# Patient Record
Sex: Male | Born: 1940 | Race: White | Hispanic: No | Marital: Married | Smoking: Former smoker
Health system: Southern US, Community
[De-identification: ages and names within clinical notes are randomized; demographics above are authoritative.]

## PROBLEM LIST (undated history)

## (undated) DIAGNOSIS — C320 Malignant neoplasm of glottis: Secondary | ICD-10-CM

## (undated) DIAGNOSIS — I2111 ST elevation (STEMI) myocardial infarction involving right coronary artery: Secondary | ICD-10-CM

## (undated) DIAGNOSIS — M5146 Schmorl's nodes, lumbar region: Secondary | ICD-10-CM

## (undated) DIAGNOSIS — I700 Atherosclerosis of aorta: Secondary | ICD-10-CM

## (undated) DIAGNOSIS — H02403 Unspecified ptosis of bilateral eyelids: Secondary | ICD-10-CM

## (undated) DIAGNOSIS — J449 Chronic obstructive pulmonary disease, unspecified: Secondary | ICD-10-CM

## (undated) DIAGNOSIS — N281 Cyst of kidney, acquired: Secondary | ICD-10-CM

## (undated) DIAGNOSIS — K08109 Complete loss of teeth, unspecified cause, unspecified class: Secondary | ICD-10-CM

## (undated) DIAGNOSIS — R0609 Other forms of dyspnea: Secondary | ICD-10-CM

## (undated) DIAGNOSIS — Z7982 Long term (current) use of aspirin: Secondary | ICD-10-CM

## (undated) DIAGNOSIS — M1990 Unspecified osteoarthritis, unspecified site: Secondary | ICD-10-CM

## (undated) DIAGNOSIS — N400 Enlarged prostate without lower urinary tract symptoms: Secondary | ICD-10-CM

## (undated) DIAGNOSIS — J381 Polyp of vocal cord and larynx: Secondary | ICD-10-CM

## (undated) DIAGNOSIS — I219 Acute myocardial infarction, unspecified: Secondary | ICD-10-CM

## (undated) DIAGNOSIS — R05 Cough: Secondary | ICD-10-CM

## (undated) DIAGNOSIS — Z9841 Cataract extraction status, right eye: Secondary | ICD-10-CM

## (undated) DIAGNOSIS — I2510 Atherosclerotic heart disease of native coronary artery without angina pectoris: Secondary | ICD-10-CM

## (undated) DIAGNOSIS — R0600 Dyspnea, unspecified: Secondary | ICD-10-CM

## (undated) DIAGNOSIS — E039 Hypothyroidism, unspecified: Secondary | ICD-10-CM

## (undated) DIAGNOSIS — R911 Solitary pulmonary nodule: Secondary | ICD-10-CM

## (undated) DIAGNOSIS — Z972 Presence of dental prosthetic device (complete) (partial): Secondary | ICD-10-CM

## (undated) DIAGNOSIS — R059 Cough, unspecified: Secondary | ICD-10-CM

## (undated) DIAGNOSIS — I452 Bifascicular block: Secondary | ICD-10-CM

## (undated) DIAGNOSIS — R053 Chronic cough: Secondary | ICD-10-CM

## (undated) DIAGNOSIS — E7800 Pure hypercholesterolemia, unspecified: Secondary | ICD-10-CM

## (undated) HISTORY — DX: Malignant neoplasm of glottis: C32.0

## (undated) HISTORY — PX: BACK SURGERY: SHX140

## (undated) HISTORY — PX: MICROLARYNGOSCOPY: SHX5208

## (undated) HISTORY — PX: VEIN LIGATION: SHX2652

## (undated) HISTORY — DX: Acute myocardial infarction, unspecified: I21.9

## (undated) HISTORY — PX: CORONARY ANGIOPLASTY: SHX604

## (undated) HISTORY — PX: HERNIA REPAIR: SHX51

---

## 2008-03-21 ENCOUNTER — Emergency Department: Payer: Self-pay | Admitting: Emergency Medicine

## 2010-06-17 ENCOUNTER — Ambulatory Visit: Payer: Self-pay | Admitting: Anesthesiology

## 2010-06-20 ENCOUNTER — Ambulatory Visit: Payer: Self-pay | Admitting: Unknown Physician Specialty

## 2010-07-11 ENCOUNTER — Ambulatory Visit: Payer: Self-pay | Admitting: Internal Medicine

## 2010-07-20 ENCOUNTER — Ambulatory Visit: Payer: Self-pay | Admitting: Internal Medicine

## 2012-04-20 DIAGNOSIS — J690 Pneumonitis due to inhalation of food and vomit: Secondary | ICD-10-CM

## 2012-04-20 HISTORY — DX: Pneumonitis due to inhalation of food and vomit: J69.0

## 2012-06-12 LAB — COMPREHENSIVE METABOLIC PANEL
Albumin: 3.6 g/dL (ref 3.4–5.0)
BUN: 15 mg/dL (ref 7–18)
Chloride: 105 mmol/L (ref 98–107)
Creatinine: 0.93 mg/dL (ref 0.60–1.30)
EGFR (African American): >60
EGFR (Non-African Amer.): >60
Potassium: 4.5 mmol/L (ref 3.5–5.1)
SGPT (ALT): 29 U/L (ref 12–78)
Total Protein: 7.7 g/dL (ref 6.4–8.2)

## 2012-06-12 LAB — CBC
HGB: 14.9 g/dL (ref 13.0–18.0)
MCHC: 34.8 g/dL (ref 32.0–36.0)
RBC: 4.68 10*6/uL (ref 4.40–5.90)
RDW: 13.4 % (ref 11.5–14.5)
WBC: 8.6 10*3/uL (ref 3.8–10.6)

## 2012-06-12 LAB — ETHANOL: Ethanol %: <0.003 % (ref 0.000–0.080)

## 2012-06-13 ENCOUNTER — Observation Stay: Payer: Self-pay | Admitting: Internal Medicine

## 2012-06-13 LAB — DRUG SCREEN, URINE
Amphetamines, Ur Screen: NEGATIVE (ref ?–1000)
Barbiturates, Ur Screen: NEGATIVE (ref ?–200)
Benzodiazepine, Ur Scrn: NEGATIVE (ref ?–200)
Cannabinoid 50 Ng, Ur ~~LOC~~: NEGATIVE (ref ?–50)
Cocaine Metabolite,Ur ~~LOC~~: NEGATIVE (ref ?–300)
Methadone, Ur Screen: NEGATIVE (ref ?–300)
Phencyclidine (PCP) Ur S: NEGATIVE (ref ?–25)
Tricyclic, Ur Screen: NEGATIVE (ref ?–1000)

## 2012-06-13 LAB — HEMOGLOBIN A1C: Hemoglobin A1C: 5.7 % (ref 4.2–6.3)

## 2012-06-13 LAB — URINALYSIS, COMPLETE
Ketone: NEGATIVE
Nitrite: NEGATIVE
RBC,UR: <1 /HPF (ref 0–5)
Specific Gravity: 1.013 (ref 1.003–1.030)

## 2012-06-13 LAB — MAGNESIUM: Magnesium: 2.1 mg/dL

## 2013-09-06 DIAGNOSIS — L570 Actinic keratosis: Secondary | ICD-10-CM | POA: Insufficient documentation

## 2014-03-12 DIAGNOSIS — K4090 Unilateral inguinal hernia, without obstruction or gangrene, not specified as recurrent: Secondary | ICD-10-CM | POA: Insufficient documentation

## 2014-03-12 DIAGNOSIS — E785 Hyperlipidemia, unspecified: Secondary | ICD-10-CM | POA: Insufficient documentation

## 2014-04-17 ENCOUNTER — Ambulatory Visit: Payer: Self-pay | Admitting: Surgery

## 2014-04-17 DIAGNOSIS — Z01810 Encounter for preprocedural cardiovascular examination: Secondary | ICD-10-CM

## 2014-04-17 LAB — COMPREHENSIVE METABOLIC PANEL
ALK PHOS: 92 U/L
AST: 34 U/L (ref 15–37)
Albumin: 3.9 g/dL (ref 3.4–5.0)
Anion Gap: 6 — ABNORMAL LOW (ref 7–16)
BUN: 22 mg/dL — ABNORMAL HIGH (ref 7–18)
Bilirubin,Total: 0.4 mg/dL (ref 0.2–1.0)
CO2: 28 mmol/L (ref 21–32)
Calcium, Total: 9.6 mg/dL (ref 8.5–10.1)
Chloride: 106 mmol/L (ref 98–107)
Creatinine: 0.94 mg/dL (ref 0.60–1.30)
EGFR (African American): >60
EGFR (Non-African Amer.): >60
GLUCOSE: 116 mg/dL — AB (ref 65–99)
Osmolality: 284 (ref 275–301)
Potassium: 4.2 mmol/L (ref 3.5–5.1)
SGPT (ALT): 39 U/L
SODIUM: 140 mmol/L (ref 136–145)
Total Protein: 8 g/dL (ref 6.4–8.2)

## 2014-07-18 DIAGNOSIS — K4090 Unilateral inguinal hernia, without obstruction or gangrene, not specified as recurrent: Secondary | ICD-10-CM | POA: Diagnosis not present

## 2014-07-18 DIAGNOSIS — Z01818 Encounter for other preprocedural examination: Secondary | ICD-10-CM | POA: Diagnosis not present

## 2014-07-31 ENCOUNTER — Ambulatory Visit
Admit: 2014-07-31 | Disposition: A | Discharge status: Home / Self Care | Payer: Self-pay | Attending: Surgery | Admitting: Surgery

## 2014-07-31 DIAGNOSIS — H9193 Unspecified hearing loss, bilateral: Secondary | ICD-10-CM | POA: Diagnosis not present

## 2014-07-31 DIAGNOSIS — K4090 Unilateral inguinal hernia, without obstruction or gangrene, not specified as recurrent: Secondary | ICD-10-CM | POA: Diagnosis not present

## 2014-07-31 DIAGNOSIS — Z809 Family history of malignant neoplasm, unspecified: Secondary | ICD-10-CM | POA: Diagnosis not present

## 2014-07-31 DIAGNOSIS — M4692 Unspecified inflammatory spondylopathy, cervical region: Secondary | ICD-10-CM | POA: Diagnosis not present

## 2014-07-31 DIAGNOSIS — Z9889 Other specified postprocedural states: Secondary | ICD-10-CM | POA: Diagnosis not present

## 2014-07-31 DIAGNOSIS — Z87891 Personal history of nicotine dependence: Secondary | ICD-10-CM | POA: Diagnosis not present

## 2014-07-31 DIAGNOSIS — D176 Benign lipomatous neoplasm of spermatic cord: Secondary | ICD-10-CM | POA: Diagnosis not present

## 2014-07-31 DIAGNOSIS — Z79899 Other long term (current) drug therapy: Secondary | ICD-10-CM | POA: Diagnosis not present

## 2014-08-10 NOTE — H&P (Signed)
PATIENT NAME:  Kevin Glover, FOISY MR#:  [id number] DATE OF BIRTH:  10/06/1940  DATE OF ADMISSION:  06/13/2012  PRIMARY CARE PHYSICIAN: Dr. Lorelee Market.   REFERRING PHYSICIAN: Dr. Randie Heinz.   CHIEF COMPLAINT: Epilepsy.   HISTORY OF PRESENT ILLNESS: The patient is a 74 year old Caucasian male with a healthy past medical history, who was in his usual state of health. He was at home and at around 9:00 p.m. he went to bed and within 10 minutes hiss wife heard him kind of shouting and when she arrived he was in bed unresponsive. There was a frothy sputum coming out of his mouth. He was jerking all over on both sides of his body with both arms flexed and he was unresponsive to her verbal commands. The wife called her son, who works with EMS and at the time he arrived the patient was not seizing anymore, but he was still in postictal phase and responsive. EMS was called and the patient was transported to the hospital. The patient woke up during the transportation and all what he remembers that he is in the ambulance. He does not remember anything prior to that except that he went to bed. There is no prior history of seizure activity or syncope. Evaluation here at the Emergency Department including CAT scan of the head and the blood workup were all unremarkable. The patient was admitted for observation and neurologic followup to ensure there is no recurrence of seizure. He did receive a dose of fosphenytoin intravenously.   REVIEW OF SYSTEMS:   CONSTITUTIONAL: Denies having any fever. No chills. No fatigue.  EYES: No blurring of vision. No double vision.  ENT: No hearing impairment. No sore throat. No dysphagia.  CARDIOVASCULAR: No chest pain. No shortness of breath. No edema.  RESPIRATORY: No shortness of breath. No cough. No sputum production.  GASTROINTESTINAL: No abdominal pain. No vomiting. No diarrhea.  GENITOURINARY: No dysuria. No frequency of urination.  MUSCULOSKELETAL: No joint pain or  swelling. No muscular pain or swelling.  INTEGUMENTARY: No skin rash. No ulcers.  NEUROLOGY: No focal weakness. No headache. He has no prior history of seizures with the exception of what happened today.  PSYCHIATRY: No anxiety. No depression.  ENDOCRINE: No polyuria or polydipsia. No heat or cold intolerance.  HEMATOLOGY: No easy bruisability. No lymph node enlargement.   PAST MEDICAL HISTORY: Healthy.   PAST SURGICAL HISTORY: Vocal cord polyp that was surgically removed.   FAMILY HISTORY: His father was killed by a motor vehicle accident when he was young. His mother that at age of 38 died from Babb.   SOCIAL HISTORY: He is married and living with his wife. He is retired from working with the [employer]. Social habits: Nonsmoker. He drinks about 4 to 6 beers a day since the age of 10. Lately I understood that he started hunting and he cut down his beer.   ADMISSION MEDICATIONS: He may take meloxicam 7.5 mg twice a day p.r.n.   ALLERGIES: No known drug allergies.   PHYSICAL EXAMINATION:  VITAL SIGNS: Blood pressure 113/72, respiratory rate 18, pulse 89, temperature 97.9, oxygen saturation 94%.  GENERAL APPEARANCE: Elderly male lying in bed in no acute distress.  HEAD AND NECK: No pallor. No icterus. No cyanosis. Ear examination revealed normal hearing. No discharge. No lesions. Oropharyngeal examination showed normal lips. No tongue biting or any marking for biting. No ulcers. No exudates. No oral thrush. Examination of the nose showed normal mucosa. No discharge. No ulcers. Eye examination revealed  normal eyelids and conjunctivae. Pupils were small and very sluggishly reactive to light. They are round and equal. Neck is supple. Trachea at midline. No thyromegaly. No cervical lymphadenopathy. No masses.  HEART: Normal S1, S2. No S3, S4. No murmur. No gallop. No carotid bruits.  RESPIRATORY: Shows normal breathing pattern without use of accessory muscles. No rales. No wheezing.  ABDOMEN:  Soft without tenderness. No hepatosplenomegaly. No masses. No hernias.  SKIN: No ulcers. No subcutaneous nodules.  MUSCULOSKELETAL: No joint swelling. No clubbing.  NEUROLOGIC: Cranial nerves II through XII are intact. No focal motor deficit. Coordination movements were normal.  PSYCHIATRIC: The patient is alert and oriented x 3. Mood and affect were normal.   LABORATORY FINDINGS: CAT scan of the head without contrast showed mild age-related atrophic changes. No intracranial mass effect or hydrocephalus. Minimal ethmoid sinus thickening on the left. Chest x-ray showed no acute cardiopulmonary abnormalities. Serum glucose 122, BUN 15, creatinine 0.9, sodium 137, potassium 4.5. Alcohol level is less than 3. Calcium was 9. Normal liver function tests and liver transaminases. Urine drug screen was negative. CBC showed white count of 8000, hemoglobin 14, hematocrit 42, platelet count 207. Urinalysis was unremarkable.   ASSESSMENT: Grand mal seizure activity, first episode. Etiology is unclear, but one would wonder if cutting down his beer lately due to hunting season whether it has any effect on precipitating his seizure is unclear at this point.   PLAN: We will admit the patient to telemetry for observation. Frequent neurologic examination and followup. Order EEG in the morning. Neurology consult. Check magnesium level. Although the patient received Cerebyx, I will hold giving further doses until evaluation by neurology,  whether we need to commit him for that. Ativan 0.5 mg to 1 mg q. 6 hours p.r.n. for anxiety if needed. The patient indicated that he has a living will and his CODE STATUS is full code.   TIME SPENT IN EVALUATING THIS PATIENT: Took more than 55 minutes.    ____________________________ Clovis Pu. Lenore Manner, MD amd:aw D: 06/13/2012 01:48:51 ET T: 06/13/2012 07:18:28 ET JOB#: [id number]  cc: Clovis Pu. Lenore Manner, MD, <Dictator> Mike Craze Irven Coe MD ELECTRONICALLY SIGNED 06/14/2012 2:16

## 2014-08-10 NOTE — Consult Note (Signed)
Brief Consult Note: Diagnosis: new onset seizure.   Patient was seen by consultant.   Consult note dictated.   Comments: - new onset unprovoked generalized seizure with brief postictal phase, non focal exam, back to baseline per family. - neg labs and MRI brain - will review EEG - no Anti Epileptic Drug, can be discharged with 1 month follow up with me. During a seizure -Do not force anything into their mouth -Do not give them water or medicine until the seizure is over -Do not try to stop jerking movement -Call 911 if the patient has prolonged seizure (more than 3-4 min) or patient does not regain consciousness between seizures.  Seizure Advice -Keep a seizure diary/log,  -Avoid alcohol,  -Avoid sleep deprivation,  -Avoid unsafe areas, such as swimming by yourself, going on roof etc. - so if you have seizure at that place, you might injure yourself.  -Legally driving is not permitted in state of [place] for 6-12 months after last seizure,.  Electronic Signatures: Ray Church (MD)  (Signed [phone] 16:51)  Authored: Brief Consult Note   Last Updated: 24-Feb-14 16:51 by Ray Church (MD)

## 2014-08-10 NOTE — Consult Note (Signed)
DATE OF BIRTH:  1940/09/15  DATE OF CONSULTATION:  06/13/2012  REFERRING PHYSICIAN:  Dr. Wilfred Curtis  CONSULTING PHYSICIAN:   Leevon Upperman K. Manuella Ghazi, MD  REASON FOR CONSULTATION:  Seizure.  HISTORY OF PRESENT ILLNESS: Kevin Glover is a 74 year old Caucasian gentleman in previously good health, was noted to have sudden vocalization on 06/12/2012 nighttime, around 8:30, by his wife. When she went upstairs saw him having flexion of both elbows and  wrists, and he was jerking violently. His eyes were open, eyes rolled backward. He was foaming at the mouth. His legs were tight. This event might have lasted up to 12 minutes or so. After that, he was confused and sleepy for around 15 to 30 minutes.   After that, he started coming around, but by next day he was completely back to himself.   The patient does drink some alcohol, a couple of beers a day, but does not drink heavy, and family does not think that he has a problem with alcohol.   He did not have any head trauma, no headaches, no fever, no rash, no new focal deficit.   The patient does not have a history of febrile seizure as a child. His development  was okay. He has not had any change in his medications or change in lifestyle. He did not have sleep deprivation.   PAST SURGICAL HISTORY:  Significant for vocal cord polyp that was surgically removed.   FAMILY HISTORY:  Significant that his father was killed in a motor vehicle accident when he was young. His mother died of melanoma at the age of 51.   SOCIAL HISTORY:  Significant that he is married. He is living with his wife. He is retired from working in [employer]. He does not smoke. He drinks 4 to 6 beers a day since the age of 73.   MEDICATIONS:  He does not take any significant medications on a regular basis.   ALLERGIES:  He does not have any known drug allergies.    REVIEW OF SYSTEMS:  His 10-system review of system was asked, and was found to be negative, other than recent seizure.    The patient did not have a tongue bite or loss of bowel and bladder during his seizure.   PHYSICAL EXAMINATION: VITAL SIGNS:  Temperature is 97.8, pulse 75,  blood pressure 125/80, pulse ox 90%.  GENERAL: He was alert, oriented. He followed 2-step  inverted commands.  LUNGS:  Clear to auscultation.  HEART:  S1, S2 heart sounds. Carotid exam did not reveal any bruit.  SKIN:  He does not have any unusual rash.  MENTAL STATUS EXAMINATION:  He was able to count months of the year backward. He was able to follow 2-step  commands. He knew the current president, previous president Bush, etc.  He was able to identify family members easily.   NEUROLOGIC: On his cranial nerve exam, his pupils were equal, round and reactive. Extraocular movements were intact. His visual fields seemed full. His face was symmetric. Tongue was midline. Facial sensations were intact. His hearing seemed to be intact. His shoulder shrug was unremarkable.   On his motor exam, he had normal tone and strength of 5/5 in all extremities.   His sensations were intact to light touch. His deep tendon reflexes were 2, except his ankle jerks were 1+.   His gait was unremarkable. His Romberg was negative.   His coordination was okay.   REVIEW OF RADIOLOGY: On his MRI of the brain, he  does have some white matter microvascular ischemic changes in his corona radiata, but otherwise I did not see any cortical lesion.   ASSESSMENT AND PLAN:  New onset unprovoked seizure in an elderly gentleman with a negative MRI of the brain. He still has a pending EEG that I will  try to review.     His blood work was unremarkable, as well as CT scan of the head.    I do not think he should take antiepileptic medication on a long-term basis at present.    He was given fosphenytoin, that does not need to be continued.    I will see the patient back in followup in a month or so as an outpatient basis.   I talked to the patient extensively about  seizure precautions and family members about first aid for seizures, such as putting the patient on his side, not putting anything in his mouth, no need to hold him down, etc.  Call 911 if the seizure lasts for more than 4 to 5 minutes, or if  patient has a cluster seizure.   The patient should not drive for at least 6 months from his last seizure. The patient should avoid swimming unsupervised or avoid climbing at high levels, etc.   The patient was advised to drink less when he had a recent seizure.     ____________________________ Royetta Crochet. Manuella Ghazi, MD hks:mr D: 06/13/2012 17:12:00 ET T: 06/13/2012 09:32:67 ET JOB#: [id number]  cc: Meindert A. Brunetta Genera, MD Junius Faucett K. Manuella Ghazi, MD, <Dictator>    Royetta Crochet [facility] MD ELECTRONICALLY SIGNED 06/15/2012 7:28

## 2014-08-10 NOTE — Discharge Summary (Signed)
PATIENT NAME:  Kevin Glover, Kevin Glover MR#:  [id number] DATE OF BIRTH:  04-27-1940  DATE OF ADMISSION:  06/13/2012 DATE OF DISCHARGE:  06/13/2012  ADMITTING DIAGNOSIS:  Seizure.  DISCHARGE DIAGNOSES: 1.  New onset seizure.  2.  Likely aspiration pneumonitis and hypoxia due to aspiration pneumonitis, resolving. 3.  Alcohol abuse and possibly dependence. 4.  Hyperglycemia with hemoglobin A1c 5.7. No diabetes mellitus medications.   DISCHARGE CONDITION:  Stable.   DISCHARGE MEDICATIONS: The patient is to start new medications which are:  Levofloxacin 750 mg p.o. daily, thiamine 100 mg p.o. daily, folic acid 1 mg p.o. once daily, Combivent Respimat CFC free 100/20, 1 puff 4 times daily.  OXYGEN:  None.   DIET: A regular diet consistency regular consistency.   ACTIVITY LIMITATIONS:  As tolerated.   FOLLOW UP:  Follow-up appointment with Dr. Manuella Ghazi, neurology, in 1 month after discharge, Dr. Brunetta Genera, primary care physician in 2 days after discharge.  The patient was advised during seizure noted before not to put anything in mouth, not to be given any water or medicine until seizure is over and not to stop any jerking movements.  Call 911 if the patient has prolonged seizure of more than 3 to 4 minutes or he does not regain consciousness between seizures,.  The patient was also advised to keep a seizure diary or log about alcohol, about sleep deprivation, about unsafe areas such as swimming by himself, going on the roof, etc., so if a seizure happens the patient may not injure himself.  Legal driving was not permitted in the state of [place] for 6 to 12 months after the last seizure. The patient was advised not to drive.   CONSULTANTS: Dr. Jennings Books.  RADIOLOGIC STUDIES: Chest PA and lateral 06/12/2012, showed hyperinflation. Otherwise, no acute cardiopulmonary disease. The patient's CT scan of head without contrast 06/12/2012, revealed mild age-related atrophic changes. No evidence of acute  ischemic or hemorrhagic infarction, no intracranial mass effect or hydrocephalus. There is minimal ethmoid sinus mucoperiosteal filling in on the left.  A CT scan of chest to rule out pulmonary embolism with IV contrast on 06/13/2012, revealing infiltrate persistent and posterior, inferiorly and anterior area of right upper lobe consistent with pneumonia. This is superimposed upon findings of COPD.  There is bibasilar atelectasis posteriorly as well. There is no evidence of acute pulmonary embolism or thoracic dissection, borderline-enlarged AP window lymph node was present.  MRI of her brain without contrast on the 06/13/2012 showed no acute intracranial pathology.  HISTORY OF PRESENT ILLNESS:  The patient is a 74 year old Caucasian male with history of alcohol abuse and dependence, who presented to the hospital with complaints of seizure episodes. Please refer to Dr. Zacarias Pontes admission note on 06/13/2012. On arrival to the Emergency Room, the patient's blood pressure was 113/72, respiratory rate was 18, pulse was 89, temperature 97.9, and oxygen saturation was 94%.   PHYSICAL EXAM: Unremarkable.   LABORATORY AND DIAGNOSTIC DATA:  The patient's lab data done on the day of admission, 06/12/2012 showed elevated glucose to 122, otherwise BMP was unremarkable. Magnesium level was normal at 2.1, hemoglobin A1c was normal at 5.7. Alcohol level was less than 0.003%. Liver enzymes were normal. TSH was normal at 0.96.  Urine drug screen was negative. CBC was normal with white blood cell count 8.6, hemoglobin was 14.9, platelet count 207. D-dimer was elevated at 0.9. The patient's urinalysis was unremarkable with yellow clear urine, negative for glucose, bilirubin or ketones, specific gravity 1.013, pH  5.0, negative for blood, protein, nitrites or leukocyte esterase, less than 1 red blood cell, less than 1 white blood cell, no bacteria or epithelial cells were noted. Mucus, however, was present.  The patient's CT scan  of head was unremarkable.   HOSPITAL COURSE:  The patient was admitted to the hospital for further evaluation and consultation with Dr. Manuella Ghazi was obtained.  The patient also underwent MRI of his brain which was normal and electroencephalogram.  Upon examining the patient Dr. Manuella Ghazi felt that the patient had new onset unprovoked generalized seizure with brief postictal phase, non-focal exam and being back to baseline per family.  The patient had negative labs as well as normal MRI of brain.  Dr. Manuella Ghazi stated that he would review the electroencephalogram and recommended no anti-epilepsy drugs at this time and follow up with him in approximately 1 week after discharge. He also gave advise for the patient's family not to force anything into the patient's mouth if he has a seizure, not to give him water or medicine until the seizure is over and do not try to stop jerking movements and call 911 if it is a prolonged seizure.  Also keep a seizure diary or log about alcohol as well as sleep deprivation and avoid unsafe areas, which may injure him.  Also not to drive. This was communicated to the patient; the patient felt comfortable to be going home.  While in the hospital the patient was also noted to be somewhat hypoxic.  His oxygen saturation at one time was noted to be 88% on room air. He underwent a CT scan of his chest, which showed pneumonia, which was felt to be likely aspiration pneumonitis.  As he had hypoxia and despite any significant symptoms the decision was made to start the patient on antibiotic therapy as well as inhalation therapy with Combivent because of underlying COPD.  For alcohol abuse the patient was advised not to drink any alcohol anymore.  He was advised to follow up with his primary care physician for recommendations for alcohol abuse dependence issues.  The patient was noted to be hyperglycemic on the admission note labs.  The patient's hemoglobin A1c, however, was normal at 5.7. No diabetes was  noted. The patient is being discharged in stable condition with the above-mentioned medications for followup. Of note, he was walked around the nursing station him prior to discharge and his oxygen saturation remained stable and normal.  His oxygen saturation was 97% on room air at rest and 94% on exertion. The patient is being discharged home today with the above-mentioned medications and followup.   TIME SPENT: 40 minutes.    ____________________________ Theodoro Grist, MD rv:ct D: 06/13/2012 17:55:57 ET T: 06/14/2012 08:27:26 ET JOB#: [id number]  cc: Theodoro Grist, MD, <Dictator> Meindert A. Brunetta Genera, MD Theodoro Grist MD ELECTRONICALLY SIGNED 07/11/2012 12:48

## 2014-08-19 NOTE — Op Note (Signed)
PATIENT NAME:  Kevin Glover, Kevin Glover MR#:  [id number] DATE OF BIRTH:  Jun 06, 1940  PREOPERATIVE DIAGNOSIS:  Left inguinal hernia.   POSTOPERATIVE DIAGNOSIS:  Left inguinal hernia.   PROCEDURE PERFORMED:  Left inguinal hernia repair.   SURGEON:  Rochel Brome.    ANESTHESIA:  General.   INDICATIONS:  This 74 year old male has recently had bulging in the left groin.  A left inguinal hernia was demonstrated on physical exam, and repair was recommended for definitive treatment.   DESCRIPTION OF PROCEDURE:  The patient was placed on the operating room table in the supine position under general anesthesia.  The abdomen was prepared with ChloraPrep and draped in a sterile manner.    A left lower quadrant transversely oriented incision was made in the suprapubic area and carried down through subcutaneous tissues.  One traversing vein was divided between 4-0 chromic suture ligatures.  Scarpa's fascia was incised.  The external oblique aponeurosis was incised along the course of its fibers to expose the inguinal cord structures, which were mobilized.  The floor of the inguinal canal appeared to be weak.  The cremaster fibers were spread to expose an indirect hernia sac which was dissected free from surrounding structures.  The sac was 5 cm in length.  It was opened.  There was a tenia coli adherent to the inner portion of the sac and this was dissected free and reduced.  Next, a high ligation of the sac was carried out with a 4-0 Vicryl suture followed by 4-0 Vicryl suture ligature and the sac was amputated.  It is also noted there was a small cord lipoma which was dissected free from surrounding structures up into the internal ring and ligated with 4-0 Vicryl suture ligature and amputated.  No tissues were submitted for pathology.  The repair was carried out with a row of 0 Surgilon sutures suturing the conjoint tendon to the shelving edge of the inguinal ligament incorporating transversalis fascia into the repair.   The last stitch led to satisfactory narrowing of the internal ring and onlay Bard soft mesh was cut to create an oval shape of some 3.5 x 6 cm.  A notch was cut out to straddle the internal ring.  The mesh was placed over the repair and sutured to the repair with interrupted 0 Surgilon sutures.  The mesh was also sutured to the fascia medially and was sutured on both sides of the internal ring.  The repair looked good.  Hemostasis was intact.  The ilioinguinal nerve was seen coursing medially.  The cut edges of the external oblique aponeurosis were closed with a running 4-0 Vicryl suture.  Next, the deep fascia superior and lateral to the repair site was infiltrated with 0.5% Sensorcaine with epinephrine also subcutaneous tissues were infiltrated using a total of 10 mL.  Next, the Scarpa's fascia was closed with interrupted 4-0 Monocryl.  The skin was closed with running 4-0 Monocryl subcuticular suture and LiquiBand. The testicle remained in the scrotum.  The patient tolerated surgery satisfactorily and was then prepared for transfer to the recovery room.     ____________________________ Lenna Sciara. Rochel Brome, MD jws:kc D: 07/31/2014 18:40:47 ET T: 07/31/2014 18:57:47 ET JOB#: [id number]  cc: Loreli Dollar, MD, <Dictator> Loreli Dollar MD ELECTRONICALLY SIGNED 08/01/2014 18:12

## 2014-09-05 DIAGNOSIS — E780 Pure hypercholesterolemia: Secondary | ICD-10-CM | POA: Diagnosis not present

## 2014-09-05 DIAGNOSIS — L570 Actinic keratosis: Secondary | ICD-10-CM | POA: Diagnosis not present

## 2014-09-05 DIAGNOSIS — K4090 Unilateral inguinal hernia, without obstruction or gangrene, not specified as recurrent: Secondary | ICD-10-CM | POA: Diagnosis not present

## 2014-09-12 DIAGNOSIS — Z0000 Encounter for general adult medical examination without abnormal findings: Secondary | ICD-10-CM | POA: Diagnosis not present

## 2014-09-12 DIAGNOSIS — E780 Pure hypercholesterolemia: Secondary | ICD-10-CM | POA: Diagnosis not present

## 2014-09-12 DIAGNOSIS — R21 Rash and other nonspecific skin eruption: Secondary | ICD-10-CM | POA: Diagnosis not present

## 2014-09-12 DIAGNOSIS — L570 Actinic keratosis: Secondary | ICD-10-CM | POA: Diagnosis not present

## 2014-09-26 DIAGNOSIS — D485 Neoplasm of uncertain behavior of skin: Secondary | ICD-10-CM | POA: Diagnosis not present

## 2014-09-26 DIAGNOSIS — L718 Other rosacea: Secondary | ICD-10-CM | POA: Diagnosis not present

## 2014-09-26 DIAGNOSIS — X32XXXA Exposure to sunlight, initial encounter: Secondary | ICD-10-CM | POA: Diagnosis not present

## 2014-09-26 DIAGNOSIS — L570 Actinic keratosis: Secondary | ICD-10-CM | POA: Diagnosis not present

## 2014-09-27 DIAGNOSIS — D044 Carcinoma in situ of skin of scalp and neck: Secondary | ICD-10-CM | POA: Diagnosis not present

## 2014-10-16 DIAGNOSIS — X32XXXA Exposure to sunlight, initial encounter: Secondary | ICD-10-CM | POA: Diagnosis not present

## 2014-10-16 DIAGNOSIS — L570 Actinic keratosis: Secondary | ICD-10-CM | POA: Diagnosis not present

## 2014-11-20 DIAGNOSIS — L570 Actinic keratosis: Secondary | ICD-10-CM | POA: Diagnosis not present

## 2014-11-27 DIAGNOSIS — D044 Carcinoma in situ of skin of scalp and neck: Secondary | ICD-10-CM | POA: Diagnosis not present

## 2014-12-28 DIAGNOSIS — H269 Unspecified cataract: Secondary | ICD-10-CM | POA: Diagnosis not present

## 2015-01-01 DIAGNOSIS — L929 Granulomatous disorder of the skin and subcutaneous tissue, unspecified: Secondary | ICD-10-CM | POA: Diagnosis not present

## 2015-02-18 DIAGNOSIS — R490 Dysphonia: Secondary | ICD-10-CM | POA: Diagnosis not present

## 2015-02-18 DIAGNOSIS — J040 Acute laryngitis: Secondary | ICD-10-CM | POA: Diagnosis not present

## 2015-03-04 DIAGNOSIS — J387 Other diseases of larynx: Secondary | ICD-10-CM | POA: Diagnosis not present

## 2015-03-04 DIAGNOSIS — R490 Dysphonia: Secondary | ICD-10-CM | POA: Diagnosis not present

## 2015-03-11 ENCOUNTER — Encounter: Payer: Self-pay | Admitting: *Deleted

## 2015-03-11 DIAGNOSIS — E7800 Pure hypercholesterolemia, unspecified: Secondary | ICD-10-CM | POA: Diagnosis not present

## 2015-03-21 NOTE — Discharge Instructions (Signed)

## 2015-03-22 ENCOUNTER — Encounter
Admission: RE | Disposition: A | Discharge status: Home / Self Care | Payer: Self-pay | Source: Ambulatory Visit | Attending: Unknown Physician Specialty

## 2015-03-22 ENCOUNTER — Ambulatory Visit: Payer: Commercial Managed Care - HMO | Admitting: Anesthesiology

## 2015-03-22 ENCOUNTER — Ambulatory Visit
Admission: RE | Admit: 2015-03-22 | Discharge: 2015-03-22 | Disposition: A | Discharge status: Home / Self Care | Payer: Commercial Managed Care - HMO | Source: Ambulatory Visit | Attending: Unknown Physician Specialty | Admitting: Unknown Physician Specialty

## 2015-03-22 DIAGNOSIS — C329 Malignant neoplasm of larynx, unspecified: Secondary | ICD-10-CM | POA: Diagnosis not present

## 2015-03-22 DIAGNOSIS — K1379 Other lesions of oral mucosa: Secondary | ICD-10-CM | POA: Diagnosis not present

## 2015-03-22 DIAGNOSIS — J383 Other diseases of vocal cords: Secondary | ICD-10-CM | POA: Diagnosis not present

## 2015-03-22 DIAGNOSIS — Z9889 Other specified postprocedural states: Secondary | ICD-10-CM | POA: Insufficient documentation

## 2015-03-22 DIAGNOSIS — F1010 Alcohol abuse, uncomplicated: Secondary | ICD-10-CM | POA: Insufficient documentation

## 2015-03-22 DIAGNOSIS — M1990 Unspecified osteoarthritis, unspecified site: Secondary | ICD-10-CM | POA: Insufficient documentation

## 2015-03-22 DIAGNOSIS — Z85828 Personal history of other malignant neoplasm of skin: Secondary | ICD-10-CM | POA: Insufficient documentation

## 2015-03-22 DIAGNOSIS — Z981 Arthrodesis status: Secondary | ICD-10-CM | POA: Diagnosis not present

## 2015-03-22 DIAGNOSIS — Z87891 Personal history of nicotine dependence: Secondary | ICD-10-CM | POA: Insufficient documentation

## 2015-03-22 HISTORY — DX: Cough: R05

## 2015-03-22 HISTORY — PX: MICROLARYNGOSCOPY: SHX5208

## 2015-03-22 HISTORY — DX: Cough, unspecified: R05.9

## 2015-03-22 HISTORY — DX: Presence of dental prosthetic device (complete) (partial): Z97.2

## 2015-03-22 HISTORY — DX: Unspecified osteoarthritis, unspecified site: M19.90

## 2015-03-22 SURGERY — MICROLARYNGOSCOPY
Anesthesia: General | Laterality: Left | Wound class: Clean Contaminated

## 2015-03-22 MED ORDER — HYDROMORPHONE HCL 1 MG/ML IJ SOLN: 0.2500 mg | INTRAMUSCULAR | Status: DC | PRN

## 2015-03-22 MED ORDER — GLYCOPYRROLATE 0.2 MG/ML IJ SOLN
INTRAMUSCULAR | Status: DC | PRN
  Administered 2015-03-22: 0.1 mg via INTRAVENOUS

## 2015-03-22 MED ORDER — ONDANSETRON HCL 4 MG/2ML IJ SOLN
INTRAMUSCULAR | Status: DC | PRN
  Administered 2015-03-22: 4 mg via INTRAVENOUS

## 2015-03-22 MED ORDER — LIDOCAINE HCL (CARDIAC) 20 MG/ML IV SOLN
INTRAVENOUS | Status: DC | PRN
  Administered 2015-03-22: 40 mg via INTRAVENOUS

## 2015-03-22 MED ORDER — SUCCINYLCHOLINE CHLORIDE 20 MG/ML IJ SOLN
INTRAMUSCULAR | Status: DC | PRN
  Administered 2015-03-22: 80 mg via INTRAVENOUS

## 2015-03-22 MED ORDER — HYDROCODONE-ACETAMINOPHEN 5-300 MG PO TABS: 1.0000 | ORAL_TABLET | ORAL | Status: DC | PRN

## 2015-03-22 MED ORDER — OXYCODONE HCL 5 MG PO TABS: 5.0000 mg | ORAL_TABLET | Freq: Once | ORAL | Status: DC | PRN

## 2015-03-22 MED ORDER — ACETAMINOPHEN 160 MG/5ML PO SOLN: 325.0000 mg | ORAL | Status: DC | PRN

## 2015-03-22 MED ORDER — PROPOFOL 10 MG/ML IV BOLUS
INTRAVENOUS | Status: DC | PRN
  Administered 2015-03-22: 130 mg via INTRAVENOUS

## 2015-03-22 MED ORDER — ACETAMINOPHEN 325 MG PO TABS: 325.0000 mg | ORAL_TABLET | ORAL | Status: DC | PRN

## 2015-03-22 MED ORDER — ONDANSETRON HCL 4 MG/2ML IJ SOLN: 4.0000 mg | Freq: Once | INTRAMUSCULAR | Status: DC | PRN

## 2015-03-22 MED ORDER — LACTATED RINGERS IV SOLN
INTRAVENOUS | Status: DC
  Administered 2015-03-22: 11:00:00 via INTRAVENOUS

## 2015-03-22 MED ORDER — MIDAZOLAM HCL 5 MG/5ML IJ SOLN
INTRAMUSCULAR | Status: DC | PRN
  Administered 2015-03-22: 2 mg via INTRAVENOUS

## 2015-03-22 MED ORDER — DEXAMETHASONE SODIUM PHOSPHATE 4 MG/ML IJ SOLN
INTRAMUSCULAR | Status: DC | PRN
  Administered 2015-03-22: 10 mg via INTRAVENOUS

## 2015-03-22 MED ORDER — OXYCODONE HCL 5 MG/5ML PO SOLN: 5.0000 mg | Freq: Once | ORAL | Status: DC | PRN

## 2015-03-22 MED ORDER — LIDOCAINE HCL 1 % IJ SOLN
INTRAMUSCULAR | Status: DC | PRN
  Administered 2015-03-22: 15 mL

## 2015-03-22 MED ORDER — FENTANYL CITRATE (PF) 100 MCG/2ML IJ SOLN
INTRAMUSCULAR | Status: DC | PRN
  Administered 2015-03-22 (×2): 50 ug via INTRAVENOUS

## 2015-03-22 SURGICAL SUPPLY — 25 items
BASIN GRAD PLASTIC 32OZ STRL (MISCELLANEOUS) ×3 IMPLANT
BLOCK BITE GUARD (MISCELLANEOUS) ×3 IMPLANT
CNTNR SPEC 2.5X3XGRAD LEK (MISCELLANEOUS) ×1
CONT SPEC 4OZ STER OR WHT (MISCELLANEOUS) ×2
CONTAINER SPEC 2.5X3XGRAD LEK (MISCELLANEOUS) ×1 IMPLANT
COVER MAYO STAND STRL (DRAPES) ×3 IMPLANT
COVER TABLE BACK 60X90 (DRAPES) ×3 IMPLANT
CUP MEDICINE 2OZ PLAST GRAD ST (MISCELLANEOUS) ×3 IMPLANT
DRAPE SHEET LG 3/4 BI-LAMINATE (DRAPES) ×3 IMPLANT
DRESSING TELFA 4X3 1S ST N-ADH (GAUZE/BANDAGES/DRESSINGS) ×3 IMPLANT
GLOVE BIO SURGEON STRL SZ7.5 (GLOVE) ×3 IMPLANT
KIT ROOM TURNOVER OR (KITS) ×3 IMPLANT
MARKER SKIN SURG W/RULER VIO (MISCELLANEOUS) ×3 IMPLANT
NEEDLE FILTER BLUNT 18X 1/2SAF (NEEDLE) ×2
NEEDLE FILTER BLUNT 18X1 1/2 (NEEDLE) ×1 IMPLANT
NS IRRIG 500ML POUR BTL (IV SOLUTION) ×3 IMPLANT
PATTIES SURGICAL .5 X.5 (GAUZE/BANDAGES/DRESSINGS) ×3 IMPLANT
SOL ANTI-FOG 6CC FOG-OUT (MISCELLANEOUS) ×1 IMPLANT
SOL FOG-OUT ANTI-FOG 6CC (MISCELLANEOUS) ×2
SPONGE XRAY 4X4 16PLY STRL (MISCELLANEOUS) ×3 IMPLANT
STRAP BODY AND KNEE 60X3 (MISCELLANEOUS) ×3 IMPLANT
SYRINGE 10CC LL (SYRINGE) ×3 IMPLANT
TOWEL OR 17X26 4PK STRL BLUE (TOWEL DISPOSABLE) ×3 IMPLANT
TUBING CONN 6MMX3.1M (TUBING) ×2
TUBING SUCTION CONN 0.25 STRL (TUBING) ×1 IMPLANT

## 2015-03-22 NOTE — Anesthesia Procedure Notes (Signed)
Procedure Name: Intubation Date/Time: 03/22/2015 12:01 PM Performed by: Mayme Genta Pre-anesthesia Checklist: Patient identified, Emergency Drugs available, Suction available, Patient being monitored and Timeout performed Patient Re-evaluated:Patient Re-evaluated prior to inductionOxygen Delivery Method: Circle system utilized Preoxygenation: Pre-oxygenation with 100% oxygen Intubation Type: IV induction Ventilation: Mask ventilation without difficulty Laryngoscope Size: Miller and 3 Grade View: Grade I Tube type: MLT Tube size: 6.0 mm Number of attempts: 1 Placement Confirmation: ETT inserted through vocal cords under direct vision,  positive ETCO2 and breath sounds checked- equal and bilateral Tube secured with: Tape Dental Injury: Teeth and Oropharynx as per pre-operative assessment

## 2015-03-22 NOTE — Transfer of Care (Signed)
Immediate Anesthesia Transfer of Care Note  Patient: Kevin Glover  Procedure(s) Performed: Procedure(s): MICROLARYNGOSCOPY WITH EXCISION V CORD LEFT AND BIOPSY (Left)  Patient Location: PACU  Anesthesia Type: General  Level of Consciousness: awake, alert  and patient cooperative  Airway and Oxygen Therapy: Patient Spontanous Breathing and Patient connected to supplemental oxygen  Post-op Assessment: Post-op Vital signs reviewed, Patient's Cardiovascular Status Stable, Respiratory Function Stable, Patent Airway and No signs of Nausea or vomiting  Post-op Vital Signs: Reviewed and stable  Complications: No apparent anesthesia complications

## 2015-03-22 NOTE — Op Note (Signed)
03/22/2015  12:16 PM    Kevin Glover  [id number]   Pre-Op Dx: LEFT VOCAL CORD LEUKOPLALUIA  Post-op Dx: SAME  Proc: Microlaryngoscopy with excisional biopsy left vocal cord   Surg:  [name]  Anes:  GOT  EBL:  Less than 5 cc  Comp:  None  Findings:  Exophytic mass left vocal fold  Procedure: [name] was identified in the holding area and taken to the operating room and placed in supine position. After general endotracheal anesthesia the table was turned 90. A Dedo laryngoscope was introduced into the airway and suspended. Examination of the larynx showed an exophytic mass involving the left vocal fold. A 0 endoscope was introduced into the airway for photo documentation. This was then removed the operating microscope was then brought into the field exam under the microscope again confirming next visit of mass of the left vocal fold. Using the shapshay microlaryngeal instruments the cup forceps were used to excise the exit of mass of the left vocal fold. With the mass removed a cottonoid pledget with phenylephrine and lidocaine solution was used to bathe the larynx again photo documentation was performed using the 0 Hopkins rod. With the mass removed and minimal oozing the laryngoscope was removed. The patient was in return anesthesia where he was awakened in the operating room and taken recovery room in stable condition  Dispo:   Good Cultures: None Specimens: Left vocal cord mass   Plan:  Discharged home follow-up in 2 weeks with voice rest  Kevin Glover  03/22/2015 12:16 PM

## 2015-03-22 NOTE — Anesthesia Preprocedure Evaluation (Signed)
Anesthesia Evaluation  Patient identified by MRN, date of birth, ID band Patient awake    Reviewed: Allergy & Precautions, H&P , NPO status , Patient's Chart, lab work & pertinent test results, reviewed documented beta blocker date and time   Airway Mallampati: II  TM Distance: >3 FB Neck ROM: full    Dental no notable dental hx.    Pulmonary former smoker,    Pulmonary exam normal breath sounds clear to auscultation       Cardiovascular Exercise Tolerance: Good negative cardio ROS Normal cardiovascular exam Rhythm:regular Rate:Normal     Neuro/Psych negative neurological ROS  negative psych ROS   GI/Hepatic negative GI ROS, Neg liver ROS,   Endo/Other  negative endocrine ROS  Renal/GU negative Renal ROS  negative genitourinary   Musculoskeletal   Abdominal   Peds  Hematology negative hematology ROS (+)   Anesthesia Other Findings   Reproductive/Obstetrics negative OB ROS                             Anesthesia Physical Anesthesia Plan  ASA: I  Anesthesia Plan: General   Post-op Pain Management:    Induction: Intravenous  Airway Management Planned: Oral ETT  Additional Equipment:   Intra-op Plan:   Post-operative Plan: Extubation in OR  Informed Consent: I have reviewed the patients History and Physical, chart, labs and discussed the procedure including the risks, benefits and alternatives for the proposed anesthesia with the patient or authorized representative who has indicated his/her understanding and acceptance.   Dental Advisory Given  Plan Discussed with: CRNA  Anesthesia Plan Comments:         Anesthesia Quick Evaluation

## 2015-03-22 NOTE — H&P (Signed)
  H+P  Reviewed and will be scanned in later. No changes noted. 

## 2015-03-22 NOTE — Anesthesia Postprocedure Evaluation (Signed)
Anesthesia Post Note  Patient: Kevin Glover  Procedure(s) Performed: Procedure(s) (LRB): MICROLARYNGOSCOPY WITH EXCISION V CORD LEFT AND BIOPSY (Left)  Patient location during evaluation: PACU Anesthesia Type: General Level of consciousness: awake and alert Pain management: pain level controlled Vital Signs Assessment: post-procedure vital signs reviewed and stable Respiratory status: spontaneous breathing, nonlabored ventilation and respiratory function stable Cardiovascular status: blood pressure returned to baseline and stable Postop Assessment: no signs of nausea or vomiting Anesthetic complications: no    Trecia Rogers

## 2015-03-25 ENCOUNTER — Encounter: Payer: Self-pay | Admitting: Unknown Physician Specialty

## 2015-03-26 LAB — SURGICAL PATHOLOGY

## 2015-03-28 ENCOUNTER — Inpatient Hospital Stay: Payer: Commercial Managed Care - HMO | Attending: Oncology | Admitting: Oncology

## 2015-03-28 ENCOUNTER — Encounter: Payer: Self-pay | Admitting: Oncology

## 2015-03-28 VITALS — BP 153/84 | HR 80 | Temp 96.5°F | Resp 18 | Wt 189.8 lb

## 2015-03-28 DIAGNOSIS — R05 Cough: Secondary | ICD-10-CM | POA: Diagnosis not present

## 2015-03-28 DIAGNOSIS — Z87891 Personal history of nicotine dependence: Secondary | ICD-10-CM

## 2015-03-28 DIAGNOSIS — R490 Dysphonia: Secondary | ICD-10-CM

## 2015-03-28 DIAGNOSIS — C320 Malignant neoplasm of glottis: Secondary | ICD-10-CM | POA: Insufficient documentation

## 2015-03-28 HISTORY — DX: Malignant neoplasm of glottis: C32.0

## 2015-03-28 NOTE — Progress Notes (Signed)
Patient here today as new evaluation referred by Dr. Tami Ribas who saw patient for hoarseness.

## 2015-03-28 NOTE — Progress Notes (Signed)
[place] @ [facility] Telephone:([phone]  Fax:([phone]) [phone]   INITIAL CONSULT  Kevin Glover OB: May 10, 1940  MR#: [id number]  [id number]  Patient Care Team: Tracie Harrier, MD as PCP - General (Internal Medicine)  CHIEF COMPLAINT: Oncology history  Chief Complaint  Patient presents with  . 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 Localized cancer of vocal cord (HCC) 161.0 C32.0       No history exists.    Oncology Flowsheet 03/22/2015  dexamethasone (DECADRON) IJ -  ondansetron (ZOFRAN) IJ -    INTERVAL HISTORY:  74 year old gentleman who had history of smoking for more than 30 years quit smoking in 1991.  40 years ago had developed dysphonia was evaluated by ENT surgeon.  Polyps were removed no evidence of any malignancy was found.  Recently had developed cold and patient developed dysphonia.  Patient has been evaluated by ENT surgeon.  Some redness of the left vocal cord was found.  Biopsy revealed well-differentiated squamous cell carcinoma.   No significant weight loss.  No difficulty swallowing.  Dry hacking cough  REVIEW OF SYSTEMS:   GENERAL:  Feels good.  Active.  No fevers, sweats or weight loss. PERFORMANCE STATUS (ECOG):  01 HEENT:  No visual changes, runny nose, sore throat, mouth sores or tenderness. Lungs: No shortness of breath or cough.  No hemoptysis. Cardiac:  No chest pain, palpitations, orthopnea, or PND. GI:  No nausea, vomiting, diarrhea, constipation, melena or hematochezia. GU:  No urgency, frequency, dysuria, or hematuria. Musculoskeletal:  No back pain.  No joint pain.  No muscle tenderness. Extremities:  No pain or swelling. Skin:  No rashes or skin changes. Neuro:  No headache, numbness or weakness, balance or coordination issues. Endocrine:  No diabetes, thyroid issues, hot flashes or night  sweats. Psych:  No mood changes, depression or anxiety. Pain:  No focal pain. Review of systems:  All other systems reviewed and found to be negative.  As per HPI. Otherwise, a complete review of systems is negatve.  PAST MEDICAL HISTORY: Past Medical History  Diagnosis Date  . Wears dentures     full upper and lower  . Arthritis     shoulder, knees  . Cough     from throat irritation  . Localized cancer of vocal cord ([place]) 03/28/2015    PAST SURGICAL HISTORY: Past Surgical History  Procedure Laterality Date  . Microlaryngoscopy      3-4 yrs ago  . Microlaryngoscopy Left 03/22/2015    Procedure: MICROLARYNGOSCOPY WITH EXCISION V CORD LEFT AND BIOPSY;  Surgeon: Beverly Gust, MD;  Location: [place];  Service: ENT;  Laterality: Left;    FAMILY HISTORY There is no significant family history of breast cancer, ovarian cancer, colon cancer GYNECOLOGIC HISTORY:  No LMP for male patient.     ADVANCED DIRECTIVES:   Patient does have advance healthcare directive, Patient   does not desire to make any changes HEALTH MAINTENANCE: Social History  Substance Use Topics  . Smoking status: Former Research scientist (life sciences)  . Smokeless tobacco: None     Comment: quit 1991  . Alcohol Use: 10.8 oz/week    18 Cans of beer per week       OBJECTIVE: PHYSICAL EXAM:GENERAL:  Well developed, well nourished, sitting comfortably in the exam room in no acute distress. MENTAL STATUS:  Alert and oriented  to person, place and time.  ENT:  Oropharynx clear without lesion.  Tongue normal. Mucous membranes moist.  RESPIRATORY:  Clear to auscultation without rales, wheezes or rhonchi. CARDIOVASCULAR:  Regular rate and rhythm without murmur, rub or gallop. BREAST:  Right breast without masses, skin changes or nipple discharge.  Left breast without masses, skin changes or nipple discharge. ABDOMEN:  Soft, non-tender, with active bowel sounds, and no hepatosplenomegaly.  No masses. BACK:  No CVA  tenderness.  No tenderness on percussion of the back or rib cage. SKIN:  No rashes, ulcers or lesions. EXTREMITIES: No edema, no skin discoloration or tenderness.  No palpable cords. LYMPH NODES: No palpable cervical, supraclavicular, axillary or inguinal adenopathy  NEUROLOGICAL: Unremarkable. PSYCH:  Appropriate.   Filed Vitals:   03/28/15 0824 03/28/15 0843  BP: 153/84   Pulse: 80   Temp: 96.5 F (35.8 C)   Resp:  18     Body mass index is 26.49 kg/(m^2).    ECOG FS:1 - Symptomatic but completely ambulatory  LAB RESULTS:  No visits with results within 5 Day(s) from this visit. Latest known visit with results is:  Admission on 03/22/2015, Discharged on 03/22/2015  Component Date Value Ref Range Status  . SURGICAL PATHOLOGY 03/22/2015    Final                   Value:Surgical Pathology CASE: [id number] PATIENT: Kevin Glover Surgical Pathology Report     SPECIMEN SUBMITTED: A. Vocal cord, left, lesion  CLINICAL HISTORY: Left vocal cord leukoplakia  PRE-OPERATIVE DIAGNOSIS: Left vocal cord leukoplakia  POST-OPERATIVE DIAGNOSIS: Exophytic mass left vocal fold     DIAGNOSIS: A. VOCAL CORD LESION, LEFT; EXCISIONAL BIOPSY: - FRAGMENTS OF SQUAMOUS CELL CARCINOMA, WELL-DIFFERENTIATED.  Comment These findings were communicated to Dr. Tami Ribas on 03/26/2015.  GROSS DESCRIPTION:  A. Labeled: left vocal cord lesion  Tissue fragment(s): multiple  Size: aggregate, 1.4 x 0.5 x 0.1 cm  Description: tan to brown fragments  Entirely submitted in one cassette(s).    Final Diagnosis performed by Quay Burow, MD.  Electronically signed 03/26/2015 3:32:03PM    The electronic signature indicates that the named Attending Pathologist has evaluated the specimen  Technical component performed at [facility], [place], [place], [place] [address] Lab: [phone] Dir: Darrick Penna. Evette Doffing, MD  Professional component performed at [facility],  [facility], [place], [place],  [address] Lab: [phone] Dir: Dellia Nims. 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 Previous history of smoking.  Patient started smoking in none 1958 and quit smoking in 1991. Pathology report has been reviewed.  All lab data has been reviewed from outside institution (dated March 11, 2015)  PLAN:  Usually further scanning may not be needed but considering persistent dysphonia and history of chronic smoking We would like to get a PET scan for complete staging workup. We will keep patient's does not have any other abnormality detected on a PET scan other than localized disease stent treatment option would include surgical versus radiation therapy both unequal options and has a equal results.  Patient will be evaluated by Dr. Donella Stade and be up to PET scan has been done case will be discussed in tumor conference.  Patient expressed understanding and  was in agreement with this plan. He also understands that He can call clinic at any time with any questions, concerns, or complaints.    Localized cancer of vocal cord ([place])   Staging form: Larynx - Glottis, AJCC 7th Edition     Clinical: Stage I (T1, N0, M0) - Signed by Forest Gleason, MD on 03/28/2015   Forest Gleason, MD   03/28/2015 8:44 AM

## 2015-04-03 ENCOUNTER — Ambulatory Visit
Admission: RE | Admit: 2015-04-03 | Discharge: 2015-04-03 | Disposition: A | Discharge status: Home / Self Care | Payer: Commercial Managed Care - HMO | Source: Ambulatory Visit | Attending: Oncology | Admitting: Oncology

## 2015-04-03 DIAGNOSIS — C320 Malignant neoplasm of glottis: Secondary | ICD-10-CM

## 2015-04-03 DIAGNOSIS — C329 Malignant neoplasm of larynx, unspecified: Secondary | ICD-10-CM | POA: Diagnosis not present

## 2015-04-03 DIAGNOSIS — J439 Emphysema, unspecified: Secondary | ICD-10-CM | POA: Insufficient documentation

## 2015-04-03 DIAGNOSIS — I7090 Unspecified atherosclerosis: Secondary | ICD-10-CM | POA: Insufficient documentation

## 2015-04-03 DIAGNOSIS — I2510 Atherosclerotic heart disease of native coronary artery without angina pectoris: Secondary | ICD-10-CM | POA: Insufficient documentation

## 2015-04-03 LAB — GLUCOSE, CAPILLARY: GLUCOSE-CAPILLARY: 93 mg/dL (ref 65–99)

## 2015-04-03 MED ORDER — FLUDEOXYGLUCOSE F - 18 (FDG) INJECTION
12.5600 | Freq: Once | INTRAVENOUS | Status: AC | PRN
  Administered 2015-04-03: 12.56 via INTRAVENOUS

## 2015-04-04 ENCOUNTER — Encounter: Payer: Self-pay | Admitting: Oncology

## 2015-04-04 ENCOUNTER — Ambulatory Visit
Admission: RE | Admit: 2015-04-04 | Discharge: 2015-04-04 | Disposition: A | Discharge status: Home / Self Care | Payer: Commercial Managed Care - HMO | Source: Ambulatory Visit | Attending: Radiation Oncology | Admitting: Radiation Oncology

## 2015-04-04 ENCOUNTER — Inpatient Hospital Stay (HOSPITAL_BASED_OUTPATIENT_CLINIC_OR_DEPARTMENT_OTHER): Payer: Commercial Managed Care - HMO | Admitting: Oncology

## 2015-04-04 ENCOUNTER — Encounter: Payer: Self-pay | Admitting: Radiation Oncology

## 2015-04-04 VITALS — BP 133/86 | HR 74 | Temp 96.7°F | Resp 18 | Wt 190.5 lb

## 2015-04-04 VITALS — BP 129/81 | HR 79 | Temp 97.6°F | Resp 18 | Wt 190.6 lb

## 2015-04-04 DIAGNOSIS — M129 Arthropathy, unspecified: Secondary | ICD-10-CM | POA: Diagnosis not present

## 2015-04-04 DIAGNOSIS — R05 Cough: Secondary | ICD-10-CM | POA: Diagnosis not present

## 2015-04-04 DIAGNOSIS — Z510 Encounter for antineoplastic radiation therapy: Secondary | ICD-10-CM | POA: Insufficient documentation

## 2015-04-04 DIAGNOSIS — C320 Malignant neoplasm of glottis: Secondary | ICD-10-CM | POA: Diagnosis not present

## 2015-04-04 DIAGNOSIS — Z87891 Personal history of nicotine dependence: Secondary | ICD-10-CM

## 2015-04-04 DIAGNOSIS — R490 Dysphonia: Secondary | ICD-10-CM | POA: Diagnosis not present

## 2015-04-04 DIAGNOSIS — C329 Malignant neoplasm of larynx, unspecified: Secondary | ICD-10-CM | POA: Diagnosis not present

## 2015-04-04 NOTE — Progress Notes (Signed)
[name] @ [facility] Telephone:(336) [phone]  Fax:(336) [phone]   INITIAL CONSULT  Kevin Glover OB: 1941-04-17  MR#: [id number]  [id number]  Patient Care Team: Tracie Harrier, MD as PCP - General (Internal Medicine) Beverly Gust, MD (Unknown Physician Specialty)  CHIEF COMPLAINT: Oncology history  Chief Complaint  Patient presents with  . 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 Localized cancer of vocal cord (HCC) 161.0 C32.0 CBC with Differential     Comprehensive metabolic panel     T4     TSH      No history exists.    Oncology Flowsheet 03/22/2015  dexamethasone (DECADRON) IJ -  ondansetron (ZOFRAN) IJ -    INTERVAL HISTORY:  74 year old gentleman who had history of smoking for more than 30 years quit smoking in 1991.  40 years ago had developed dysphonia was evaluated by ENT surgeon.  Polyps were removed no evidence of any malignancy was found.  Recently had developed cold and patient developed dysphonia.  Patient has been evaluated by ENT surgeon.  Some redness of the left vocal cord was found.  Biopsy revealed well-differentiated squamous cell carcinoma.   No significant weight loss.  No difficulty swallowing.  Dry hacking cough   Patient  had a PET scan done also was evaluated by radiation oncologist.  Here to discuss the results and the further planning of treatment REVIEW OF SYSTEMS:   GENERAL:  Feels good.  Active.  No fevers, sweats or weight loss. PERFORMANCE STATUS (ECOG):  01 HEENT:  No visual changes, runny nose, sore throat, mouth sores or tenderness. Lungs: No shortness of breath or cough.  No hemoptysis. Cardiac:  No chest pain, palpitations, orthopnea, or PND. GI:  No nausea, vomiting, diarrhea, constipation, melena or hematochezia. GU:  No urgency, frequency, dysuria, or  hematuria. Musculoskeletal:  No back pain.  No joint pain.  No muscle tenderness. Extremities:  No pain or swelling. Skin:  No rashes or skin changes. Neuro:  No headache, numbness or weakness, balance or coordination issues. Endocrine:  No diabetes, thyroid issues, hot flashes or night sweats. Psych:  No mood changes, depression or anxiety. Pain:  No focal pain. Review of systems:  All other systems reviewed and found to be negative.  As per HPI. Otherwise, a complete review of systems is negatve.  PAST MEDICAL HISTORY: Past Medical History  Diagnosis Date  . Wears dentures     full upper and lower  . Arthritis     shoulder, knees  . Cough     from throat irritation  . Localized cancer of vocal cord ([place]) 03/28/2015    PAST SURGICAL HISTORY: Past Surgical History  Procedure Laterality Date  . Microlaryngoscopy      3-4 yrs ago  . Microlaryngoscopy Left 03/22/2015    Procedure: MICROLARYNGOSCOPY WITH EXCISION V CORD LEFT AND BIOPSY;  Surgeon: Beverly Gust, MD;  Location: [place];  Service: ENT;  Laterality: Left;    FAMILY HISTORY There is no significant family history of breast cancer, ovarian cancer, colon cancer GYNECOLOGIC HISTORY:  No LMP for male patient.     ADVANCED DIRECTIVES:   Patient does have advance healthcare directive, Patient   does not desire to make any changes HEALTH MAINTENANCE: Social History  Substance Use Topics  . Smoking status: Former Research scientist (life sciences)  . Smokeless tobacco: None  Comment: quit 1991  . Alcohol Use: 10.8 oz/week    18 Cans of beer per week       OBJECTIVE: PHYSICAL EXAM:GENERAL:  Well developed, well nourished, sitting comfortably in the exam room in no acute distress. MENTAL STATUS:  Alert and oriented to person, place and time.  ENT:  Oropharynx clear without lesion.  Tongue normal. Mucous membranes moist.  RESPIRATORY:  Clear to auscultation without rales, wheezes or rhonchi. CARDIOVASCULAR:  Regular rate  and rhythm without murmur, rub or gallop. BREAST:  Right breast without masses, skin changes or nipple discharge.  Left breast without masses, skin changes or nipple discharge. ABDOMEN:  Soft, non-tender, with active bowel sounds, and no hepatosplenomegaly.  No masses. BACK:  No CVA tenderness.  No tenderness on percussion of the back or rib cage. SKIN:  No rashes, ulcers or lesions. EXTREMITIES: No edema, no skin discoloration or tenderness.  No palpable cords. LYMPH NODES: No palpable cervical, supraclavicular, axillary or inguinal adenopathy  NEUROLOGICAL: Unremarkable. PSYCH:  Appropriate.   Filed Vitals:   04/04/15 1044  BP: 133/86  Pulse: 74  Temp: 96.7 F (35.9 C)  Resp: 18     Body mass index is 26.58 kg/(m^2).    ECOG FS:1 - Symptomatic but completely ambulatory  LAB RESULTS:  Hospital Outpatient Visit on 04/03/2015  Component Date Value Ref Range Status  . 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 Previous history of smoking.  Patient started smoking in none 1958 and quit smoking in 1991. Pathology report has been reviewed.  All lab data has been reviewed from outside institution (dated March 11, 2015) Will discuss this case with Dr. Tami Ribas as well as with Dr. Donella Stade PLAN: PET scan has been reviewed independently There is no evidence of lymph node involvement PET scan of the chest appears to be normal except for emphysematous chest Discussed with the patient will proceed with radiation therapy and reevaluate patient in 6 months or before if needed Localized cancer of vocal cord [place])   Staging form: Larynx - Glottis, AJCC 7th Edition     Clinical: Stage I (T1, N0, M0) - Signed by Forest Gleason, MD on 03/28/2015   Forest Gleason, MD   04/04/2015 11:52 AM

## 2015-04-04 NOTE — Consult Note (Signed)
Except an outstanding is perfect of Radiation Oncology NEW PATIENT EVALUATION  Name: Kevin Glover  MRN: [id number]  Date:   04/04/2015     DOB: 11-13-1940   This 74 y.o. 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  REFERRING PHYSICIAN: Tracie Harrier, MD  CHIEF COMPLAINT:  Chief Complaint  Patient presents with  . Cancer    Pt is here for initial consultation of vocal cord cancer.      DIAGNOSIS: The encounter diagnosis was Vocal cord cancer ([place]).   PREVIOUS INVESTIGATIONS:  PET CT scan is reviewed Pathology report reviewed Clinical notes reviewed  HPI: Patient is a 74 year old male with a 30 pack year smoking history quit in 1991 presented with dysphonia was found by ENT to have several polyps 2 years prior which were removed. Recently his been having some tonal quality changes of his voice was again was seen by ENT erythematous changes of the left cord were noted and biopsy was positive for well-differentiated squamous cell carcinoma. At the time of microlaryngoscopy mass of the left cord was visualized and biopsied as above. Patient was seen by medical oncology PET CT scan was ordered showing no obvious findings of hypermetabolic activity in the larynx or neck. Incidentally noted was atherosclerosis and some mild diffuse bronchial thickening consistent with COPD. Patient's having very little symptoms except for the quality of his voice. He specifically denies dysphagia cough or any head and neck pain. He is now referred radiation collagen for opinion.  PLANNED TREATMENT REGIMEN: External beam radiation therapy  PAST MEDICAL HISTORY:  has a past medical history of Wears dentures; Arthritis; Cough; and Localized cancer of vocal cord ([place]) (03/28/2015).    PAST SURGICAL HISTORY:  Past Surgical History  Procedure Laterality Date  . Microlaryngoscopy      3-4 yrs ago  . Microlaryngoscopy Left 03/22/2015    Procedure:  MICROLARYNGOSCOPY WITH EXCISION V CORD LEFT AND BIOPSY;  Surgeon: Beverly Gust, MD;  Location: [facility];  Service: ENT;  Laterality: Left;    FAMILY HISTORY: family history is not on file.  SOCIAL HISTORY:  reports that he has quit smoking. He does not have any smokeless tobacco history on file. He reports that he drinks about 10.8 oz of alcohol per week.  ALLERGIES: Review of patient's allergies indicates no known allergies.  MEDICATIONS:  No current outpatient prescriptions on file.   No current facility-administered medications for this encounter.    ECOG PERFORMANCE STATUS:  0 - Asymptomatic  REVIEW OF SYSTEMS: Except for the change in his tonal quality Patient denies any weight loss, fatigue, weakness, fever, chills or night sweats. Patient denies any loss of vision, blurred vision. Patient denies any ringing  of the ears or hearing loss. No irregular heartbeat. Patient denies heart murmur or history of fainting. Patient denies any chest pain or pain radiating to her upper extremities. Patient denies any shortness of breath, difficulty breathing at night, cough or hemoptysis. Patient denies any swelling in the lower legs. Patient denies any nausea vomiting, vomiting of blood, or coffee ground material in the vomitus. Patient denies any stomach pain. Patient states has had normal bowel movements no significant constipation or diarrhea. Patient denies any dysuria, hematuria or significant nocturia. Patient denies any problems walking, swelling in the joints or loss of balance. Patient denies any skin changes, loss of hair or loss of weight. Patient denies any excessive worrying or anxiety or significant depression. Patient denies  any problems with insomnia. Patient denies excessive thirst, polyuria, polydipsia. Patient denies any swollen glands, patient denies easy bruising or easy bleeding. Patient denies any recent infections, allergies or URI. Patient "s visual fields have not  changed significantly in recent time.    PHYSICAL EXAM: BP 129/81 mmHg  Pulse 79  Temp(Src) 97.6 F (36.4 C)  Resp 18  Wt 190 lb 9.4 oz (86.45 kg) Oral cavity is clear no oral mucosal lesions are identified. Indirect mirror examination shows some erythematous changes of the left true cord cords a perfectly mobile. Neck is clear without evidence of subject gastric cervical or supra clavicular adenopathy. Well-developed well-nourished patient in NAD. HEENT reveals PERLA, EOMI, discs not visualized.  Oral cavity is clear. No oral mucosal lesions are identified. Neck is clear without evidence of cervical or supraclavicular adenopathy. Lungs are clear to A&P. Cardiac examination is essentially unremarkable with regular rate and rhythm without murmur rub or thrill. Abdomen is benign with no organomegaly or masses noted. Motor sensory and DTR levels are equal and symmetric in the upper and lower extremities. Cranial nerves II through XII are grossly intact. Proprioception is intact. No peripheral adenopathy or edema is identified. No motor or sensory levels are noted. Crude visual fields are within normal range.  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 Would plan on delivering 6600 cGy over 6 weeks. Risks and benefits of treatment of treatment including possible worsening of his tonal quality temporarily, possible dysphasia, skin reaction, fatigue, all were discussed in detail with the patient and his wife. Both seem to comprehend my treatment plan well. I have set him up for CT simulation and ordered that.  I would like to take this opportunity for allowing me to participate in the care of your patient.Armstead Peaks., MD

## 2015-04-17 ENCOUNTER — Ambulatory Visit
Admission: RE | Admit: 2015-04-17 | Discharge: 2015-04-17 | Disposition: A | Discharge status: Home / Self Care | Payer: Commercial Managed Care - HMO | Source: Ambulatory Visit | Attending: Radiation Oncology | Admitting: Radiation Oncology

## 2015-04-17 DIAGNOSIS — Z510 Encounter for antineoplastic radiation therapy: Secondary | ICD-10-CM | POA: Diagnosis not present

## 2015-04-17 DIAGNOSIS — M129 Arthropathy, unspecified: Secondary | ICD-10-CM | POA: Diagnosis not present

## 2015-04-17 DIAGNOSIS — C320 Malignant neoplasm of glottis: Secondary | ICD-10-CM | POA: Diagnosis not present

## 2015-04-17 DIAGNOSIS — Z87891 Personal history of nicotine dependence: Secondary | ICD-10-CM | POA: Diagnosis not present

## 2015-04-19 ENCOUNTER — Other Ambulatory Visit: Payer: Self-pay | Admitting: *Deleted

## 2015-04-19 DIAGNOSIS — C320 Malignant neoplasm of glottis: Secondary | ICD-10-CM

## 2015-04-21 DIAGNOSIS — M129 Arthropathy, unspecified: Secondary | ICD-10-CM | POA: Diagnosis not present

## 2015-04-21 DIAGNOSIS — Z87891 Personal history of nicotine dependence: Secondary | ICD-10-CM | POA: Diagnosis not present

## 2015-04-21 DIAGNOSIS — C320 Malignant neoplasm of glottis: Secondary | ICD-10-CM | POA: Diagnosis not present

## 2015-04-21 DIAGNOSIS — Z510 Encounter for antineoplastic radiation therapy: Secondary | ICD-10-CM | POA: Diagnosis present

## 2015-04-23 DIAGNOSIS — M129 Arthropathy, unspecified: Secondary | ICD-10-CM | POA: Diagnosis not present

## 2015-04-23 DIAGNOSIS — Z87891 Personal history of nicotine dependence: Secondary | ICD-10-CM | POA: Diagnosis not present

## 2015-04-23 DIAGNOSIS — C320 Malignant neoplasm of glottis: Secondary | ICD-10-CM | POA: Diagnosis not present

## 2015-04-23 DIAGNOSIS — Z510 Encounter for antineoplastic radiation therapy: Secondary | ICD-10-CM | POA: Diagnosis not present

## 2015-04-25 ENCOUNTER — Ambulatory Visit
Admission: RE | Admit: 2015-04-25 | Discharge: 2015-04-25 | Disposition: A | Discharge status: Home / Self Care | Payer: Commercial Managed Care - HMO | Source: Ambulatory Visit | Attending: Radiation Oncology | Admitting: Radiation Oncology

## 2015-04-25 DIAGNOSIS — Z23 Encounter for immunization: Secondary | ICD-10-CM | POA: Diagnosis not present

## 2015-04-25 DIAGNOSIS — N529 Male erectile dysfunction, unspecified: Secondary | ICD-10-CM | POA: Diagnosis not present

## 2015-04-25 DIAGNOSIS — Z87891 Personal history of nicotine dependence: Secondary | ICD-10-CM | POA: Diagnosis not present

## 2015-04-25 DIAGNOSIS — M129 Arthropathy, unspecified: Secondary | ICD-10-CM | POA: Diagnosis not present

## 2015-04-25 DIAGNOSIS — Z510 Encounter for antineoplastic radiation therapy: Secondary | ICD-10-CM | POA: Diagnosis not present

## 2015-04-25 DIAGNOSIS — C320 Malignant neoplasm of glottis: Secondary | ICD-10-CM | POA: Diagnosis not present

## 2015-04-29 ENCOUNTER — Ambulatory Visit: Payer: Commercial Managed Care - HMO

## 2015-04-30 ENCOUNTER — Ambulatory Visit
Admission: RE | Admit: 2015-04-30 | Discharge: 2015-04-30 | Disposition: A | Discharge status: Home / Self Care | Payer: Commercial Managed Care - HMO | Source: Ambulatory Visit | Attending: Radiation Oncology | Admitting: Radiation Oncology

## 2015-04-30 DIAGNOSIS — M129 Arthropathy, unspecified: Secondary | ICD-10-CM | POA: Diagnosis not present

## 2015-04-30 DIAGNOSIS — Z510 Encounter for antineoplastic radiation therapy: Secondary | ICD-10-CM | POA: Diagnosis not present

## 2015-04-30 DIAGNOSIS — Z87891 Personal history of nicotine dependence: Secondary | ICD-10-CM | POA: Diagnosis not present

## 2015-04-30 DIAGNOSIS — C320 Malignant neoplasm of glottis: Secondary | ICD-10-CM | POA: Diagnosis not present

## 2015-05-01 ENCOUNTER — Ambulatory Visit
Admission: RE | Admit: 2015-05-01 | Discharge: 2015-05-01 | Disposition: A | Discharge status: Home / Self Care | Payer: Commercial Managed Care - HMO | Source: Ambulatory Visit | Attending: Radiation Oncology | Admitting: Radiation Oncology

## 2015-05-01 DIAGNOSIS — C320 Malignant neoplasm of glottis: Secondary | ICD-10-CM | POA: Diagnosis not present

## 2015-05-01 DIAGNOSIS — Z87891 Personal history of nicotine dependence: Secondary | ICD-10-CM | POA: Diagnosis not present

## 2015-05-01 DIAGNOSIS — M129 Arthropathy, unspecified: Secondary | ICD-10-CM | POA: Diagnosis not present

## 2015-05-01 DIAGNOSIS — Z510 Encounter for antineoplastic radiation therapy: Secondary | ICD-10-CM | POA: Diagnosis not present

## 2015-05-02 ENCOUNTER — Ambulatory Visit
Admission: RE | Admit: 2015-05-02 | Discharge: 2015-05-02 | Disposition: A | Discharge status: Home / Self Care | Payer: Commercial Managed Care - HMO | Source: Ambulatory Visit | Attending: Radiation Oncology | Admitting: Radiation Oncology

## 2015-05-02 DIAGNOSIS — Z510 Encounter for antineoplastic radiation therapy: Secondary | ICD-10-CM | POA: Diagnosis not present

## 2015-05-02 DIAGNOSIS — C320 Malignant neoplasm of glottis: Secondary | ICD-10-CM | POA: Diagnosis not present

## 2015-05-02 DIAGNOSIS — M129 Arthropathy, unspecified: Secondary | ICD-10-CM | POA: Diagnosis not present

## 2015-05-02 DIAGNOSIS — Z87891 Personal history of nicotine dependence: Secondary | ICD-10-CM | POA: Diagnosis not present

## 2015-05-03 ENCOUNTER — Ambulatory Visit
Admission: RE | Admit: 2015-05-03 | Discharge: 2015-05-03 | Disposition: A | Discharge status: Home / Self Care | Payer: Commercial Managed Care - HMO | Source: Ambulatory Visit | Attending: Radiation Oncology | Admitting: Radiation Oncology

## 2015-05-03 DIAGNOSIS — C320 Malignant neoplasm of glottis: Secondary | ICD-10-CM | POA: Diagnosis not present

## 2015-05-03 DIAGNOSIS — Z510 Encounter for antineoplastic radiation therapy: Secondary | ICD-10-CM | POA: Diagnosis not present

## 2015-05-03 DIAGNOSIS — Z87891 Personal history of nicotine dependence: Secondary | ICD-10-CM | POA: Diagnosis not present

## 2015-05-03 DIAGNOSIS — M129 Arthropathy, unspecified: Secondary | ICD-10-CM | POA: Diagnosis not present

## 2015-05-06 ENCOUNTER — Ambulatory Visit
Admission: RE | Admit: 2015-05-06 | Discharge: 2015-05-06 | Disposition: A | Discharge status: Home / Self Care | Payer: Commercial Managed Care - HMO | Source: Ambulatory Visit | Attending: Radiation Oncology | Admitting: Radiation Oncology

## 2015-05-06 ENCOUNTER — Inpatient Hospital Stay: Payer: Commercial Managed Care - HMO | Attending: Oncology

## 2015-05-06 DIAGNOSIS — M129 Arthropathy, unspecified: Secondary | ICD-10-CM | POA: Diagnosis not present

## 2015-05-06 DIAGNOSIS — Z510 Encounter for antineoplastic radiation therapy: Secondary | ICD-10-CM | POA: Diagnosis not present

## 2015-05-06 DIAGNOSIS — C320 Malignant neoplasm of glottis: Secondary | ICD-10-CM | POA: Insufficient documentation

## 2015-05-06 DIAGNOSIS — Z87891 Personal history of nicotine dependence: Secondary | ICD-10-CM | POA: Diagnosis not present

## 2015-05-06 LAB — COMPREHENSIVE METABOLIC PANEL
ALT: 23 U/L (ref 17–63)
AST: 24 U/L (ref 15–41)
Albumin: 4.4 g/dL (ref 3.5–5.0)
Alkaline Phosphatase: 75 U/L (ref 38–126)
Anion gap: 9 (ref 5–15)
BUN: 17 mg/dL (ref 6–20)
CHLORIDE: 99 mmol/L — AB (ref 101–111)
CO2: 28 mmol/L (ref 22–32)
CREATININE: 0.93 mg/dL (ref 0.61–1.24)
Calcium: 9.8 mg/dL (ref 8.9–10.3)
GFR calc non Af Amer: >60 mL/min (ref >60)
Glucose, Bld: 115 mg/dL — ABNORMAL HIGH (ref 65–99)
Potassium: 4.6 mmol/L (ref 3.5–5.1)
SODIUM: 136 mmol/L (ref 135–145)
Total Bilirubin: 0.9 mg/dL (ref 0.3–1.2)
Total Protein: 7.9 g/dL (ref 6.5–8.1)

## 2015-05-06 LAB — CBC WITH DIFFERENTIAL/PLATELET
BASOS ABS: 0 10*3/uL (ref 0–0.1)
BASOS PCT: 1 %
EOS ABS: 0.1 10*3/uL (ref 0–0.7)
EOS PCT: 2 %
HCT: 46.5 % (ref 40.0–52.0)
Hemoglobin: 16 g/dL (ref 13.0–18.0)
Lymphocytes Relative: 21 %
Lymphs Abs: 1.4 10*3/uL (ref 1.0–3.6)
MCH: 31.3 pg (ref 26.0–34.0)
MCHC: 34.4 g/dL (ref 32.0–36.0)
MCV: 91.1 fL (ref 80.0–100.0)
Monocytes Absolute: 0.7 10*3/uL (ref 0.2–1.0)
Monocytes Relative: 10 %
Neutro Abs: 4.2 10*3/uL (ref 1.4–6.5)
Neutrophils Relative %: 66 %
PLATELETS: 203 10*3/uL (ref 150–440)
RBC: 5.11 MIL/uL (ref 4.40–5.90)
RDW: 13.9 % (ref 11.5–14.5)
WBC: 6.4 10*3/uL (ref 3.8–10.6)

## 2015-05-06 LAB — TSH: TSH: 2.806 u[IU]/mL (ref 0.350–4.500)

## 2015-05-07 ENCOUNTER — Ambulatory Visit
Admission: RE | Admit: 2015-05-07 | Discharge: 2015-05-07 | Disposition: A | Discharge status: Home / Self Care | Payer: Commercial Managed Care - HMO | Source: Ambulatory Visit | Attending: Radiation Oncology | Admitting: Radiation Oncology

## 2015-05-07 DIAGNOSIS — Z87891 Personal history of nicotine dependence: Secondary | ICD-10-CM | POA: Diagnosis not present

## 2015-05-07 DIAGNOSIS — M129 Arthropathy, unspecified: Secondary | ICD-10-CM | POA: Diagnosis not present

## 2015-05-07 DIAGNOSIS — C320 Malignant neoplasm of glottis: Secondary | ICD-10-CM | POA: Diagnosis not present

## 2015-05-07 DIAGNOSIS — Z510 Encounter for antineoplastic radiation therapy: Secondary | ICD-10-CM | POA: Diagnosis not present

## 2015-05-07 LAB — T4: T4, Total: 8.4 ug/dL (ref 4.5–12.0)

## 2015-05-08 ENCOUNTER — Ambulatory Visit
Admission: RE | Admit: 2015-05-08 | Discharge: 2015-05-08 | Disposition: A | Discharge status: Home / Self Care | Payer: Commercial Managed Care - HMO | Source: Ambulatory Visit | Attending: Radiation Oncology | Admitting: Radiation Oncology

## 2015-05-08 DIAGNOSIS — M129 Arthropathy, unspecified: Secondary | ICD-10-CM | POA: Diagnosis not present

## 2015-05-08 DIAGNOSIS — Z510 Encounter for antineoplastic radiation therapy: Secondary | ICD-10-CM | POA: Diagnosis not present

## 2015-05-08 DIAGNOSIS — C320 Malignant neoplasm of glottis: Secondary | ICD-10-CM | POA: Diagnosis not present

## 2015-05-08 DIAGNOSIS — Z87891 Personal history of nicotine dependence: Secondary | ICD-10-CM | POA: Diagnosis not present

## 2015-05-09 ENCOUNTER — Ambulatory Visit
Admission: RE | Admit: 2015-05-09 | Discharge: 2015-05-09 | Disposition: A | Discharge status: Home / Self Care | Payer: Commercial Managed Care - HMO | Source: Ambulatory Visit | Attending: Radiation Oncology | Admitting: Radiation Oncology

## 2015-05-09 DIAGNOSIS — Z510 Encounter for antineoplastic radiation therapy: Secondary | ICD-10-CM | POA: Diagnosis not present

## 2015-05-09 DIAGNOSIS — Z87891 Personal history of nicotine dependence: Secondary | ICD-10-CM | POA: Diagnosis not present

## 2015-05-09 DIAGNOSIS — C320 Malignant neoplasm of glottis: Secondary | ICD-10-CM | POA: Diagnosis not present

## 2015-05-09 DIAGNOSIS — M129 Arthropathy, unspecified: Secondary | ICD-10-CM | POA: Diagnosis not present

## 2015-05-10 ENCOUNTER — Ambulatory Visit
Admission: RE | Admit: 2015-05-10 | Discharge: 2015-05-10 | Disposition: A | Discharge status: Home / Self Care | Payer: Commercial Managed Care - HMO | Source: Ambulatory Visit | Attending: Radiation Oncology | Admitting: Radiation Oncology

## 2015-05-10 DIAGNOSIS — Z510 Encounter for antineoplastic radiation therapy: Secondary | ICD-10-CM | POA: Diagnosis not present

## 2015-05-10 DIAGNOSIS — M129 Arthropathy, unspecified: Secondary | ICD-10-CM | POA: Diagnosis not present

## 2015-05-10 DIAGNOSIS — Z87891 Personal history of nicotine dependence: Secondary | ICD-10-CM | POA: Diagnosis not present

## 2015-05-10 DIAGNOSIS — C320 Malignant neoplasm of glottis: Secondary | ICD-10-CM | POA: Diagnosis not present

## 2015-05-13 ENCOUNTER — Ambulatory Visit
Admission: RE | Admit: 2015-05-13 | Discharge: 2015-05-13 | Disposition: A | Discharge status: Home / Self Care | Payer: Commercial Managed Care - HMO | Source: Ambulatory Visit | Attending: Radiation Oncology | Admitting: Radiation Oncology

## 2015-05-13 ENCOUNTER — Inpatient Hospital Stay: Payer: Commercial Managed Care - HMO

## 2015-05-13 DIAGNOSIS — Z510 Encounter for antineoplastic radiation therapy: Secondary | ICD-10-CM | POA: Diagnosis not present

## 2015-05-13 DIAGNOSIS — Z87891 Personal history of nicotine dependence: Secondary | ICD-10-CM | POA: Diagnosis not present

## 2015-05-13 DIAGNOSIS — M129 Arthropathy, unspecified: Secondary | ICD-10-CM | POA: Diagnosis not present

## 2015-05-13 DIAGNOSIS — C320 Malignant neoplasm of glottis: Secondary | ICD-10-CM

## 2015-05-13 LAB — CBC
HCT: 45 % (ref 40.0–52.0)
HEMOGLOBIN: 15.4 g/dL (ref 13.0–18.0)
MCH: 31.4 pg (ref 26.0–34.0)
MCHC: 34.3 g/dL (ref 32.0–36.0)
MCV: 91.5 fL (ref 80.0–100.0)
Platelets: 205 10*3/uL (ref 150–440)
RBC: 4.92 MIL/uL (ref 4.40–5.90)
RDW: 13.9 % (ref 11.5–14.5)
WBC: 5.2 10*3/uL (ref 3.8–10.6)

## 2015-05-14 ENCOUNTER — Ambulatory Visit
Admission: RE | Admit: 2015-05-14 | Discharge: 2015-05-14 | Disposition: A | Discharge status: Home / Self Care | Payer: Commercial Managed Care - HMO | Source: Ambulatory Visit | Attending: Radiation Oncology | Admitting: Radiation Oncology

## 2015-05-14 ENCOUNTER — Other Ambulatory Visit: Payer: Self-pay | Admitting: *Deleted

## 2015-05-14 DIAGNOSIS — M129 Arthropathy, unspecified: Secondary | ICD-10-CM | POA: Diagnosis not present

## 2015-05-14 DIAGNOSIS — Z87891 Personal history of nicotine dependence: Secondary | ICD-10-CM | POA: Diagnosis not present

## 2015-05-14 DIAGNOSIS — C320 Malignant neoplasm of glottis: Secondary | ICD-10-CM | POA: Diagnosis not present

## 2015-05-14 DIAGNOSIS — Z510 Encounter for antineoplastic radiation therapy: Secondary | ICD-10-CM | POA: Diagnosis not present

## 2015-05-14 MED ORDER — SUCRALFATE 1 G PO TABS: 1.0000 g | ORAL_TABLET | Freq: Three times a day (TID) | ORAL | Status: DC

## 2015-05-15 ENCOUNTER — Ambulatory Visit
Admission: RE | Admit: 2015-05-15 | Discharge: 2015-05-15 | Disposition: A | Discharge status: Home / Self Care | Payer: Commercial Managed Care - HMO | Source: Ambulatory Visit | Attending: Radiation Oncology | Admitting: Radiation Oncology

## 2015-05-15 DIAGNOSIS — C320 Malignant neoplasm of glottis: Secondary | ICD-10-CM | POA: Diagnosis not present

## 2015-05-15 DIAGNOSIS — Z87891 Personal history of nicotine dependence: Secondary | ICD-10-CM | POA: Diagnosis not present

## 2015-05-15 DIAGNOSIS — Z510 Encounter for antineoplastic radiation therapy: Secondary | ICD-10-CM | POA: Diagnosis not present

## 2015-05-15 DIAGNOSIS — M129 Arthropathy, unspecified: Secondary | ICD-10-CM | POA: Diagnosis not present

## 2015-05-16 ENCOUNTER — Ambulatory Visit
Admission: RE | Admit: 2015-05-16 | Discharge: 2015-05-16 | Disposition: A | Discharge status: Home / Self Care | Payer: Commercial Managed Care - HMO | Source: Ambulatory Visit | Attending: Radiation Oncology | Admitting: Radiation Oncology

## 2015-05-16 DIAGNOSIS — C329 Malignant neoplasm of larynx, unspecified: Secondary | ICD-10-CM | POA: Diagnosis not present

## 2015-05-16 DIAGNOSIS — Z510 Encounter for antineoplastic radiation therapy: Secondary | ICD-10-CM | POA: Diagnosis not present

## 2015-05-16 DIAGNOSIS — C320 Malignant neoplasm of glottis: Secondary | ICD-10-CM | POA: Diagnosis not present

## 2015-05-16 DIAGNOSIS — M129 Arthropathy, unspecified: Secondary | ICD-10-CM | POA: Diagnosis not present

## 2015-05-16 DIAGNOSIS — Z87891 Personal history of nicotine dependence: Secondary | ICD-10-CM | POA: Diagnosis not present

## 2015-05-17 ENCOUNTER — Ambulatory Visit
Admission: RE | Admit: 2015-05-17 | Discharge: 2015-05-17 | Disposition: A | Discharge status: Home / Self Care | Payer: Commercial Managed Care - HMO | Source: Ambulatory Visit | Attending: Radiation Oncology | Admitting: Radiation Oncology

## 2015-05-17 DIAGNOSIS — C320 Malignant neoplasm of glottis: Secondary | ICD-10-CM | POA: Diagnosis not present

## 2015-05-17 DIAGNOSIS — Z87891 Personal history of nicotine dependence: Secondary | ICD-10-CM | POA: Diagnosis not present

## 2015-05-17 DIAGNOSIS — Z510 Encounter for antineoplastic radiation therapy: Secondary | ICD-10-CM | POA: Diagnosis not present

## 2015-05-17 DIAGNOSIS — M129 Arthropathy, unspecified: Secondary | ICD-10-CM | POA: Diagnosis not present

## 2015-05-20 ENCOUNTER — Inpatient Hospital Stay: Payer: Commercial Managed Care - HMO

## 2015-05-20 ENCOUNTER — Ambulatory Visit
Admission: RE | Admit: 2015-05-20 | Discharge: 2015-05-20 | Disposition: A | Discharge status: Home / Self Care | Payer: Commercial Managed Care - HMO | Source: Ambulatory Visit | Attending: Radiation Oncology | Admitting: Radiation Oncology

## 2015-05-20 DIAGNOSIS — M129 Arthropathy, unspecified: Secondary | ICD-10-CM | POA: Diagnosis not present

## 2015-05-20 DIAGNOSIS — Z510 Encounter for antineoplastic radiation therapy: Secondary | ICD-10-CM | POA: Diagnosis not present

## 2015-05-20 DIAGNOSIS — Z87891 Personal history of nicotine dependence: Secondary | ICD-10-CM | POA: Diagnosis not present

## 2015-05-20 DIAGNOSIS — C320 Malignant neoplasm of glottis: Secondary | ICD-10-CM | POA: Diagnosis not present

## 2015-05-21 ENCOUNTER — Ambulatory Visit
Admission: RE | Admit: 2015-05-21 | Discharge: 2015-05-21 | Disposition: A | Discharge status: Home / Self Care | Payer: Commercial Managed Care - HMO | Source: Ambulatory Visit | Attending: Radiation Oncology | Admitting: Radiation Oncology

## 2015-05-21 DIAGNOSIS — C320 Malignant neoplasm of glottis: Secondary | ICD-10-CM | POA: Diagnosis not present

## 2015-05-21 DIAGNOSIS — Z87891 Personal history of nicotine dependence: Secondary | ICD-10-CM | POA: Diagnosis not present

## 2015-05-21 DIAGNOSIS — M129 Arthropathy, unspecified: Secondary | ICD-10-CM | POA: Diagnosis not present

## 2015-05-21 DIAGNOSIS — Z510 Encounter for antineoplastic radiation therapy: Secondary | ICD-10-CM | POA: Diagnosis not present

## 2015-05-22 ENCOUNTER — Ambulatory Visit
Admission: RE | Admit: 2015-05-22 | Discharge: 2015-05-22 | Disposition: A | Discharge status: Home / Self Care | Payer: Commercial Managed Care - HMO | Source: Ambulatory Visit | Attending: Radiation Oncology | Admitting: Radiation Oncology

## 2015-05-22 DIAGNOSIS — M129 Arthropathy, unspecified: Secondary | ICD-10-CM | POA: Diagnosis not present

## 2015-05-22 DIAGNOSIS — Z87891 Personal history of nicotine dependence: Secondary | ICD-10-CM | POA: Diagnosis not present

## 2015-05-22 DIAGNOSIS — C320 Malignant neoplasm of glottis: Secondary | ICD-10-CM | POA: Diagnosis not present

## 2015-05-22 DIAGNOSIS — Z510 Encounter for antineoplastic radiation therapy: Secondary | ICD-10-CM | POA: Diagnosis not present

## 2015-05-23 ENCOUNTER — Ambulatory Visit
Admission: RE | Admit: 2015-05-23 | Discharge: 2015-05-23 | Disposition: A | Discharge status: Home / Self Care | Payer: Commercial Managed Care - HMO | Source: Ambulatory Visit | Attending: Radiation Oncology | Admitting: Radiation Oncology

## 2015-05-23 DIAGNOSIS — C320 Malignant neoplasm of glottis: Secondary | ICD-10-CM | POA: Diagnosis not present

## 2015-05-23 DIAGNOSIS — M129 Arthropathy, unspecified: Secondary | ICD-10-CM | POA: Diagnosis not present

## 2015-05-23 DIAGNOSIS — Z510 Encounter for antineoplastic radiation therapy: Secondary | ICD-10-CM | POA: Diagnosis not present

## 2015-05-23 DIAGNOSIS — Z87891 Personal history of nicotine dependence: Secondary | ICD-10-CM | POA: Diagnosis not present

## 2015-05-24 ENCOUNTER — Ambulatory Visit
Admission: RE | Admit: 2015-05-24 | Discharge: 2015-05-24 | Disposition: A | Discharge status: Home / Self Care | Payer: Commercial Managed Care - HMO | Source: Ambulatory Visit | Attending: Radiation Oncology | Admitting: Radiation Oncology

## 2015-05-24 DIAGNOSIS — Z87891 Personal history of nicotine dependence: Secondary | ICD-10-CM | POA: Diagnosis not present

## 2015-05-24 DIAGNOSIS — Z510 Encounter for antineoplastic radiation therapy: Secondary | ICD-10-CM | POA: Diagnosis not present

## 2015-05-24 DIAGNOSIS — C320 Malignant neoplasm of glottis: Secondary | ICD-10-CM | POA: Diagnosis not present

## 2015-05-24 DIAGNOSIS — M129 Arthropathy, unspecified: Secondary | ICD-10-CM | POA: Diagnosis not present

## 2015-05-27 ENCOUNTER — Ambulatory Visit
Admission: RE | Admit: 2015-05-27 | Discharge: 2015-05-27 | Disposition: A | Discharge status: Home / Self Care | Payer: Commercial Managed Care - HMO | Source: Ambulatory Visit | Attending: Radiation Oncology | Admitting: Radiation Oncology

## 2015-05-27 ENCOUNTER — Inpatient Hospital Stay: Payer: Commercial Managed Care - HMO | Attending: Oncology

## 2015-05-27 DIAGNOSIS — M129 Arthropathy, unspecified: Secondary | ICD-10-CM | POA: Diagnosis not present

## 2015-05-27 DIAGNOSIS — Z510 Encounter for antineoplastic radiation therapy: Secondary | ICD-10-CM | POA: Diagnosis not present

## 2015-05-27 DIAGNOSIS — C320 Malignant neoplasm of glottis: Secondary | ICD-10-CM | POA: Insufficient documentation

## 2015-05-27 DIAGNOSIS — Z87891 Personal history of nicotine dependence: Secondary | ICD-10-CM | POA: Diagnosis not present

## 2015-05-27 LAB — CBC
HCT: 46.1 % (ref 40.0–52.0)
Hemoglobin: 16 g/dL (ref 13.0–18.0)
MCH: 32 pg (ref 26.0–34.0)
MCHC: 34.8 g/dL (ref 32.0–36.0)
MCV: 92.1 fL (ref 80.0–100.0)
PLATELETS: 199 10*3/uL (ref 150–440)
RBC: 5 MIL/uL (ref 4.40–5.90)
RDW: 13.6 % (ref 11.5–14.5)
WBC: 5.7 10*3/uL (ref 3.8–10.6)

## 2015-05-28 ENCOUNTER — Ambulatory Visit
Admission: RE | Admit: 2015-05-28 | Discharge: 2015-05-28 | Disposition: A | Discharge status: Home / Self Care | Payer: Commercial Managed Care - HMO | Source: Ambulatory Visit | Attending: Radiation Oncology | Admitting: Radiation Oncology

## 2015-05-28 DIAGNOSIS — C320 Malignant neoplasm of glottis: Secondary | ICD-10-CM | POA: Diagnosis not present

## 2015-05-28 DIAGNOSIS — Z87891 Personal history of nicotine dependence: Secondary | ICD-10-CM | POA: Diagnosis not present

## 2015-05-28 DIAGNOSIS — M129 Arthropathy, unspecified: Secondary | ICD-10-CM | POA: Diagnosis not present

## 2015-05-28 DIAGNOSIS — Z510 Encounter for antineoplastic radiation therapy: Secondary | ICD-10-CM | POA: Diagnosis not present

## 2015-05-29 ENCOUNTER — Ambulatory Visit
Admission: RE | Admit: 2015-05-29 | Discharge: 2015-05-29 | Disposition: A | Discharge status: Home / Self Care | Payer: Commercial Managed Care - HMO | Source: Ambulatory Visit | Attending: Radiation Oncology | Admitting: Radiation Oncology

## 2015-05-29 DIAGNOSIS — Z87891 Personal history of nicotine dependence: Secondary | ICD-10-CM | POA: Diagnosis not present

## 2015-05-29 DIAGNOSIS — M129 Arthropathy, unspecified: Secondary | ICD-10-CM | POA: Diagnosis not present

## 2015-05-29 DIAGNOSIS — Z510 Encounter for antineoplastic radiation therapy: Secondary | ICD-10-CM | POA: Diagnosis not present

## 2015-05-29 DIAGNOSIS — C320 Malignant neoplasm of glottis: Secondary | ICD-10-CM | POA: Diagnosis not present

## 2015-05-30 ENCOUNTER — Ambulatory Visit
Admission: RE | Admit: 2015-05-30 | Discharge: 2015-05-30 | Disposition: A | Discharge status: Home / Self Care | Payer: Commercial Managed Care - HMO | Source: Ambulatory Visit | Attending: Radiation Oncology | Admitting: Radiation Oncology

## 2015-05-30 DIAGNOSIS — C320 Malignant neoplasm of glottis: Secondary | ICD-10-CM | POA: Diagnosis not present

## 2015-05-30 DIAGNOSIS — Z87891 Personal history of nicotine dependence: Secondary | ICD-10-CM | POA: Diagnosis not present

## 2015-05-30 DIAGNOSIS — M129 Arthropathy, unspecified: Secondary | ICD-10-CM | POA: Diagnosis not present

## 2015-05-30 DIAGNOSIS — Z510 Encounter for antineoplastic radiation therapy: Secondary | ICD-10-CM | POA: Diagnosis not present

## 2015-05-31 ENCOUNTER — Ambulatory Visit
Admission: RE | Admit: 2015-05-31 | Discharge: 2015-05-31 | Disposition: A | Discharge status: Home / Self Care | Payer: Commercial Managed Care - HMO | Source: Ambulatory Visit | Attending: Radiation Oncology | Admitting: Radiation Oncology

## 2015-05-31 DIAGNOSIS — M129 Arthropathy, unspecified: Secondary | ICD-10-CM | POA: Diagnosis not present

## 2015-05-31 DIAGNOSIS — C320 Malignant neoplasm of glottis: Secondary | ICD-10-CM | POA: Diagnosis not present

## 2015-05-31 DIAGNOSIS — Z510 Encounter for antineoplastic radiation therapy: Secondary | ICD-10-CM | POA: Diagnosis not present

## 2015-05-31 DIAGNOSIS — Z87891 Personal history of nicotine dependence: Secondary | ICD-10-CM | POA: Diagnosis not present

## 2015-06-03 ENCOUNTER — Inpatient Hospital Stay: Payer: Commercial Managed Care - HMO

## 2015-06-03 ENCOUNTER — Ambulatory Visit
Admission: RE | Admit: 2015-06-03 | Discharge: 2015-06-03 | Disposition: A | Discharge status: Home / Self Care | Payer: Commercial Managed Care - HMO | Source: Ambulatory Visit | Attending: Radiation Oncology | Admitting: Radiation Oncology

## 2015-06-03 DIAGNOSIS — C320 Malignant neoplasm of glottis: Secondary | ICD-10-CM | POA: Diagnosis not present

## 2015-06-03 DIAGNOSIS — Z87891 Personal history of nicotine dependence: Secondary | ICD-10-CM | POA: Diagnosis not present

## 2015-06-03 DIAGNOSIS — Z510 Encounter for antineoplastic radiation therapy: Secondary | ICD-10-CM | POA: Diagnosis not present

## 2015-06-03 DIAGNOSIS — M129 Arthropathy, unspecified: Secondary | ICD-10-CM | POA: Diagnosis not present

## 2015-06-04 ENCOUNTER — Ambulatory Visit
Admission: RE | Admit: 2015-06-04 | Discharge: 2015-06-04 | Disposition: A | Discharge status: Home / Self Care | Payer: Commercial Managed Care - HMO | Source: Ambulatory Visit | Attending: Radiation Oncology | Admitting: Radiation Oncology

## 2015-06-04 DIAGNOSIS — Z510 Encounter for antineoplastic radiation therapy: Secondary | ICD-10-CM | POA: Diagnosis not present

## 2015-06-04 DIAGNOSIS — M129 Arthropathy, unspecified: Secondary | ICD-10-CM | POA: Diagnosis not present

## 2015-06-04 DIAGNOSIS — C320 Malignant neoplasm of glottis: Secondary | ICD-10-CM | POA: Diagnosis not present

## 2015-06-04 DIAGNOSIS — Z87891 Personal history of nicotine dependence: Secondary | ICD-10-CM | POA: Diagnosis not present

## 2015-06-05 ENCOUNTER — Ambulatory Visit
Admission: RE | Admit: 2015-06-05 | Discharge: 2015-06-05 | Disposition: A | Discharge status: Home / Self Care | Payer: Commercial Managed Care - HMO | Source: Ambulatory Visit | Attending: Radiation Oncology | Admitting: Radiation Oncology

## 2015-06-05 DIAGNOSIS — Z510 Encounter for antineoplastic radiation therapy: Secondary | ICD-10-CM | POA: Diagnosis not present

## 2015-06-05 DIAGNOSIS — C320 Malignant neoplasm of glottis: Secondary | ICD-10-CM | POA: Diagnosis not present

## 2015-06-05 DIAGNOSIS — Z87891 Personal history of nicotine dependence: Secondary | ICD-10-CM | POA: Diagnosis not present

## 2015-06-05 DIAGNOSIS — M129 Arthropathy, unspecified: Secondary | ICD-10-CM | POA: Diagnosis not present

## 2015-06-06 ENCOUNTER — Ambulatory Visit
Admission: RE | Admit: 2015-06-06 | Discharge: 2015-06-06 | Disposition: A | Discharge status: Home / Self Care | Payer: Commercial Managed Care - HMO | Source: Ambulatory Visit | Attending: Radiation Oncology | Admitting: Radiation Oncology

## 2015-06-06 DIAGNOSIS — Z510 Encounter for antineoplastic radiation therapy: Secondary | ICD-10-CM | POA: Diagnosis not present

## 2015-06-06 DIAGNOSIS — C320 Malignant neoplasm of glottis: Secondary | ICD-10-CM | POA: Diagnosis not present

## 2015-06-06 DIAGNOSIS — Z87891 Personal history of nicotine dependence: Secondary | ICD-10-CM | POA: Diagnosis not present

## 2015-06-06 DIAGNOSIS — M129 Arthropathy, unspecified: Secondary | ICD-10-CM | POA: Diagnosis not present

## 2015-06-07 ENCOUNTER — Ambulatory Visit
Admission: RE | Admit: 2015-06-07 | Discharge: 2015-06-07 | Disposition: A | Discharge status: Home / Self Care | Payer: Commercial Managed Care - HMO | Source: Ambulatory Visit | Attending: Radiation Oncology | Admitting: Radiation Oncology

## 2015-06-07 DIAGNOSIS — Z510 Encounter for antineoplastic radiation therapy: Secondary | ICD-10-CM | POA: Diagnosis not present

## 2015-06-07 DIAGNOSIS — Z87891 Personal history of nicotine dependence: Secondary | ICD-10-CM | POA: Diagnosis not present

## 2015-06-07 DIAGNOSIS — M129 Arthropathy, unspecified: Secondary | ICD-10-CM | POA: Diagnosis not present

## 2015-06-07 DIAGNOSIS — C320 Malignant neoplasm of glottis: Secondary | ICD-10-CM | POA: Diagnosis not present

## 2015-06-10 ENCOUNTER — Ambulatory Visit
Admission: RE | Admit: 2015-06-10 | Discharge: 2015-06-10 | Disposition: A | Discharge status: Home / Self Care | Payer: Commercial Managed Care - HMO | Source: Ambulatory Visit | Attending: Radiation Oncology | Admitting: Radiation Oncology

## 2015-06-10 DIAGNOSIS — M129 Arthropathy, unspecified: Secondary | ICD-10-CM | POA: Diagnosis not present

## 2015-06-10 DIAGNOSIS — Z87891 Personal history of nicotine dependence: Secondary | ICD-10-CM | POA: Diagnosis not present

## 2015-06-10 DIAGNOSIS — C320 Malignant neoplasm of glottis: Secondary | ICD-10-CM | POA: Diagnosis not present

## 2015-06-10 DIAGNOSIS — Z510 Encounter for antineoplastic radiation therapy: Secondary | ICD-10-CM | POA: Diagnosis not present

## 2015-06-11 ENCOUNTER — Other Ambulatory Visit: Payer: Self-pay | Admitting: *Deleted

## 2015-06-11 ENCOUNTER — Ambulatory Visit
Admission: RE | Admit: 2015-06-11 | Discharge: 2015-06-11 | Disposition: A | Discharge status: Home / Self Care | Payer: Commercial Managed Care - HMO | Source: Ambulatory Visit | Attending: Radiation Oncology | Admitting: Radiation Oncology

## 2015-06-11 DIAGNOSIS — Z510 Encounter for antineoplastic radiation therapy: Secondary | ICD-10-CM | POA: Diagnosis not present

## 2015-06-11 DIAGNOSIS — M129 Arthropathy, unspecified: Secondary | ICD-10-CM | POA: Diagnosis not present

## 2015-06-11 DIAGNOSIS — C320 Malignant neoplasm of glottis: Secondary | ICD-10-CM | POA: Diagnosis not present

## 2015-06-11 DIAGNOSIS — Z87891 Personal history of nicotine dependence: Secondary | ICD-10-CM | POA: Diagnosis not present

## 2015-06-11 MED ORDER — SILVER SULFADIAZINE 1 % EX CREA: 1.0000 "application " | TOPICAL_CREAM | Freq: Two times a day (BID) | CUTANEOUS | Status: DC

## 2015-06-12 ENCOUNTER — Ambulatory Visit: Payer: Commercial Managed Care - HMO

## 2015-06-12 ENCOUNTER — Ambulatory Visit
Admission: RE | Admit: 2015-06-12 | Discharge: 2015-06-12 | Disposition: A | Discharge status: Home / Self Care | Payer: Commercial Managed Care - HMO | Source: Ambulatory Visit | Attending: Radiation Oncology | Admitting: Radiation Oncology

## 2015-06-12 DIAGNOSIS — M129 Arthropathy, unspecified: Secondary | ICD-10-CM | POA: Diagnosis not present

## 2015-06-12 DIAGNOSIS — Z510 Encounter for antineoplastic radiation therapy: Secondary | ICD-10-CM | POA: Diagnosis not present

## 2015-06-12 DIAGNOSIS — Z87891 Personal history of nicotine dependence: Secondary | ICD-10-CM | POA: Diagnosis not present

## 2015-06-12 DIAGNOSIS — C320 Malignant neoplasm of glottis: Secondary | ICD-10-CM | POA: Diagnosis not present

## 2015-06-13 ENCOUNTER — Ambulatory Visit
Admission: RE | Admit: 2015-06-13 | Discharge: 2015-06-13 | Disposition: A | Discharge status: Home / Self Care | Payer: Commercial Managed Care - HMO | Source: Ambulatory Visit | Attending: Radiation Oncology | Admitting: Radiation Oncology

## 2015-06-13 DIAGNOSIS — M129 Arthropathy, unspecified: Secondary | ICD-10-CM | POA: Diagnosis not present

## 2015-06-13 DIAGNOSIS — Z87891 Personal history of nicotine dependence: Secondary | ICD-10-CM | POA: Diagnosis not present

## 2015-06-13 DIAGNOSIS — Z510 Encounter for antineoplastic radiation therapy: Secondary | ICD-10-CM | POA: Diagnosis not present

## 2015-06-13 DIAGNOSIS — C320 Malignant neoplasm of glottis: Secondary | ICD-10-CM | POA: Diagnosis not present

## 2015-06-27 DIAGNOSIS — C329 Malignant neoplasm of larynx, unspecified: Secondary | ICD-10-CM | POA: Diagnosis not present

## 2015-06-27 DIAGNOSIS — R490 Dysphonia: Secondary | ICD-10-CM | POA: Diagnosis not present

## 2015-07-18 ENCOUNTER — Encounter: Payer: Self-pay | Admitting: Radiation Oncology

## 2015-07-18 ENCOUNTER — Ambulatory Visit
Admission: RE | Admit: 2015-07-18 | Discharge: 2015-07-18 | Disposition: A | Discharge status: Home / Self Care | Payer: Commercial Managed Care - HMO | Source: Ambulatory Visit | Attending: Radiation Oncology | Admitting: Radiation Oncology

## 2015-07-18 VITALS — BP 146/90 | HR 78 | Temp 97.4°F | Resp 20 | Wt 181.7 lb

## 2015-07-18 DIAGNOSIS — C320 Malignant neoplasm of glottis: Secondary | ICD-10-CM

## 2015-07-18 NOTE — Progress Notes (Signed)
Radiation Oncology Follow up Note  Name: Kevin Glover   Date:   07/18/2015 MRN:  [id number] DOB: 27-Dec-1940    This 75 y.o. male presents to the clinic today for follow-up for squamous cell carcinoma the larynx now 1 month out external beam radiation therapy.  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 He is seen today in routine follow-up is doing well. He's having no dysphagia or head and neck pain. Still has a fairly raspy voice. Recently saw ENT who stated there is some swelling in the larynx but no evidence of disease..  COMPLICATIONS OF TREATMENT: none  FOLLOW UP COMPLIANCE: keeps appointments   PHYSICAL EXAM:  BP 146/90 mmHg  Pulse 78  Temp(Src) 97.4 F (36.3 C)  Resp 20  Wt 181 lb 10.5 oz (82.4 kg) Oral cavity is clear no oral mucosal lesions are identified indirect mirror examination shows edema in the vocal cords bilaterally no evidence of mass or nodularity is noted. Upper airways clear vallecula and base of tongue within normal limits. Neck is clear without evidence of subject gastric cervical or supraclavicular adenopathy. Well-developed well-nourished patient in NAD. HEENT reveals PERLA, EOMI, discs not visualized.  Oral cavity is clear. No oral mucosal lesions are identified. Neck is clear without evidence of cervical or supraclavicular adenopathy. Lungs are clear to A&P. Cardiac examination is essentially unremarkable with regular rate and rhythm without murmur rub or thrill. Abdomen is benign with no organomegaly or masses noted. Motor sensory and DTR levels are equal and symmetric in the upper and lower extremities. Cranial nerves II through XII are grossly intact. Proprioception is intact. No peripheral adenopathy or edema is identified. No motor or sensory levels are noted. Crude visual fields are within normal range.  RADIOLOGY  RESULTS: No current films for review  PLAN: At the present time patient is doing well recovering nicely from his external beam radiation therapy. I am please was overall progress. He continues at this time monthly follow-ups with ENT. I have asked to see him back in 3-4 months for follow-up. Patient knows to call sooner with any concerns. I've assured him the quality of his voice will improve over time as the laryngeal edema resolve's.  I would like to take this opportunity for allowing me to participate in the care of your patient.Armstead Peaks., MD

## 2015-08-12 DIAGNOSIS — R490 Dysphonia: Secondary | ICD-10-CM | POA: Diagnosis not present

## 2015-08-12 DIAGNOSIS — C329 Malignant neoplasm of larynx, unspecified: Secondary | ICD-10-CM | POA: Diagnosis not present

## 2015-10-03 ENCOUNTER — Encounter: Payer: Self-pay | Admitting: Hematology and Oncology

## 2015-10-03 ENCOUNTER — Ambulatory Visit: Payer: Commercial Managed Care - HMO | Admitting: Hematology and Oncology

## 2015-10-03 ENCOUNTER — Inpatient Hospital Stay (HOSPITAL_BASED_OUTPATIENT_CLINIC_OR_DEPARTMENT_OTHER): Payer: Commercial Managed Care - HMO | Admitting: Hematology and Oncology

## 2015-10-03 ENCOUNTER — Other Ambulatory Visit: Payer: Self-pay | Admitting: *Deleted

## 2015-10-03 ENCOUNTER — Ambulatory Visit: Payer: Commercial Managed Care - HMO | Admitting: Oncology

## 2015-10-03 ENCOUNTER — Ambulatory Visit
Admission: RE | Admit: 2015-10-03 | Discharge: 2015-10-03 | Disposition: A | Discharge status: Home / Self Care | Payer: Commercial Managed Care - HMO | Source: Ambulatory Visit | Attending: Radiation Oncology | Admitting: Radiation Oncology

## 2015-10-03 ENCOUNTER — Encounter: Payer: Self-pay | Admitting: Radiation Oncology

## 2015-10-03 ENCOUNTER — Inpatient Hospital Stay: Payer: Commercial Managed Care - HMO | Attending: Hematology and Oncology

## 2015-10-03 VITALS — BP 116/78 | HR 70 | Temp 96.6°F | Resp 20 | Wt 178.9 lb

## 2015-10-03 VITALS — BP 131/87 | HR 69 | Temp 96.7°F | Resp 17 | Ht 71.0 in | Wt 178.9 lb

## 2015-10-03 DIAGNOSIS — R05 Cough: Secondary | ICD-10-CM

## 2015-10-03 DIAGNOSIS — C320 Malignant neoplasm of glottis: Secondary | ICD-10-CM

## 2015-10-03 DIAGNOSIS — Z79899 Other long term (current) drug therapy: Secondary | ICD-10-CM | POA: Insufficient documentation

## 2015-10-03 DIAGNOSIS — C329 Malignant neoplasm of larynx, unspecified: Secondary | ICD-10-CM | POA: Diagnosis not present

## 2015-10-03 DIAGNOSIS — M1990 Unspecified osteoarthritis, unspecified site: Secondary | ICD-10-CM | POA: Diagnosis not present

## 2015-10-03 DIAGNOSIS — Z923 Personal history of irradiation: Secondary | ICD-10-CM | POA: Diagnosis not present

## 2015-10-03 DIAGNOSIS — Z87891 Personal history of nicotine dependence: Secondary | ICD-10-CM | POA: Diagnosis not present

## 2015-10-03 DIAGNOSIS — Z8521 Personal history of malignant neoplasm of larynx: Secondary | ICD-10-CM | POA: Insufficient documentation

## 2015-10-03 LAB — COMPREHENSIVE METABOLIC PANEL
ALT: 46 U/L (ref 17–63)
AST: 42 U/L — ABNORMAL HIGH (ref 15–41)
Albumin: 3.9 g/dL (ref 3.5–5.0)
Alkaline Phosphatase: 67 U/L (ref 38–126)
Anion gap: 10 (ref 5–15)
BUN: 16 mg/dL (ref 6–20)
CO2: 27 mmol/L (ref 22–32)
Calcium: 9.4 mg/dL (ref 8.9–10.3)
Chloride: 100 mmol/L — ABNORMAL LOW (ref 101–111)
Creatinine, Ser: 0.95 mg/dL (ref 0.61–1.24)
GFR calc Af Amer: >60 mL/min (ref >60)
GFR calc non Af Amer: >60 mL/min (ref >60)
Glucose, Bld: 101 mg/dL — ABNORMAL HIGH (ref 65–99)
Potassium: 3.9 mmol/L (ref 3.5–5.1)
Sodium: 137 mmol/L (ref 135–145)
Total Bilirubin: 0.7 mg/dL (ref 0.3–1.2)
Total Protein: 7.5 g/dL (ref 6.5–8.1)

## 2015-10-03 LAB — CBC WITH DIFFERENTIAL/PLATELET
Basophils Absolute: 0.1 10*3/uL (ref 0–0.1)
Basophils Relative: 1 %
Eosinophils Absolute: 0.1 10*3/uL (ref 0–0.7)
Eosinophils Relative: 2 %
HCT: 44 % (ref 40.0–52.0)
Hemoglobin: 15.2 g/dL (ref 13.0–18.0)
Lymphocytes Relative: 20 %
Lymphs Abs: 1.4 10*3/uL (ref 1.0–3.6)
MCH: 31.1 pg (ref 26.0–34.0)
MCHC: 34.5 g/dL (ref 32.0–36.0)
MCV: 90.3 fL (ref 80.0–100.0)
Monocytes Absolute: 0.6 10*3/uL (ref 0.2–1.0)
Monocytes Relative: 9 %
Neutro Abs: 4.9 10*3/uL (ref 1.4–6.5)
Neutrophils Relative %: 68 %
Platelets: 239 10*3/uL (ref 150–440)
RBC: 4.87 MIL/uL (ref 4.40–5.90)
RDW: 13.8 % (ref 11.5–14.5)
WBC: 7.1 10*3/uL (ref 3.8–10.6)

## 2015-10-03 LAB — TSH: TSH: 4.072 u[IU]/mL (ref 0.350–4.500)

## 2015-10-03 NOTE — Progress Notes (Signed)
[facility] day:  10/03/2015  Chief Complaint: Kevin Glover is a 75 y.o. male with stage I left larynx cancer who is seen for reassessment.  HPI: The patient is a 30 pack year smoking history. He quit smoking in 1991. He presented with a cough and hoarseness. He was seen by Dr. Tami Ribas.  Initial evaluation revealed several polyps which were removed.  He continued to have symptoms prompting reevaluation by ENT.  The left vocal cord was notably erythematous. Biopsy on 03/22/2015 was positive for well-differentiated squamous cell carcinoma.  PET scan on 04/03/2015 revealed no definite vocal cord mass or wall hypermetabolism.  There were no findings to suggest metastatic disease to the neck, chest, abdomen or pelvis.  He received definitive radiation consisting of 66 Gy to the vocal cord from 04/30/2015 until 06/13/2015. He tolerated treatment extremely well with some erythematous changes and dry desquamation to the neck.  He states that he has followed up with Dr. Tami Ribas. Endoscopy was negative. He saw Dr. Baruch Gouty today.  He was noted to have a slight productive cough for which Mucinex was suggested. There was no evidence of recurrent disease.  Symptomatically, he notes a slight clear cough. He notes issues keeping his weight up.  Appetite is good.  He denies any change in his voice or swallowing issues.  Past Medical History  Diagnosis Date  . Wears dentures     full upper and lower  . Arthritis     shoulder, knees  . Cough     from throat irritation  . 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 Microlaryngoscopy      3-4 yrs ago  . Microlaryngoscopy Left 03/22/2015    Procedure: MICROLARYNGOSCOPY WITH EXCISION V CORD LEFT AND BIOPSY;  Surgeon: Beverly Gust, MD;  Location: [facility];  Service: ENT;  Laterality: Left;    Family History  Problem Relation Age of  Onset  . Cancer Mother     Melanoma    Social History:  reports that he quit smoking about 27 years ago. His smoking use included Cigarettes. He does not have any smokeless tobacco history on file. He reports that he drinks about 10.8 oz of alcohol per week. His drug history is not on file.  The patient is alone today.  Allergies: No Known Allergies  Current Medications: No current outpatient prescriptions on file.   No current facility-administered medications for this visit.    Review of Systems:  GENERAL:  Feels good.  Active.  No fevers, sweats or weight loss. PERFORMANCE STATUS (ECOG):  0 HEENT:  No visual changes, runny nose, sore throat, mouth sores or tenderness. Lungs: No shortness of breath.  Slight clear cough.  No hemoptysis. Cardiac:  No chest pain, palpitations, orthopnea, or PND. GI:  Appetite good.  No nausea, vomiting, diarrhea, constipation, melena or hematochezia. GU:  No urgency, frequency, dysuria, or hematuria. Musculoskeletal:  No back pain.  No joint pain.  No muscle tenderness. Extremities:  No pain or swelling. Skin:  No rashes or skin changes. Neuro:  No headache, numbness or weakness, balance or coordination issues. Endocrine:  No diabetes, thyroid issues, hot flashes or night sweats. Psych:  No mood changes, depression or anxiety. Pain:  No focal pain. Review of systems:  All other systems reviewed and found to be negative.  Physical Exam: Blood pressure 131/87, pulse 69, temperature 96.7 F (35.9 C),  temperature source Tympanic, resp. rate 17, height $RemoveBe'5\' 11"'GhqiQhAXa$  (1.803 m), weight 178 lb 14.5 oz (81.15 kg). GENERAL:  Well developed, well nourished, gentleman sitting comfortably in the exam room in no acute distress. MENTAL STATUS:  Alert and oriented to person, place and time. HEAD:  Pearline Cables hair.  Normocephalic, atraumatic, face symmetric, no Cushingoid features. EYES:  Glasses.  Blue eyes.  Pupils equal round and reactive to light and accomodation.  No  conjunctivitis or scleral icterus. ENT:  Oropharynx clear without lesion.  Tongue normal. Mucous membranes moist.  RESPIRATORY:  Clear to auscultation without rales, wheezes or rhonchi. CARDIOVASCULAR:  Regular rate and rhythm without murmur, rub or gallop. ABDOMEN:  Soft, non-tender, with active bowel sounds, and no hepatosplenomegaly.  No masses. SKIN:  No rashes, ulcers or lesions. EXTREMITIES: No edema, no skin discoloration or tenderness.  No palpable cords. LYMPH NODES: No palpable cervical, supraclavicular, axillary or inguinal adenopathy  NEUROLOGICAL: Unremarkable. PSYCH:  Appropriate.   Appointment on 10/03/2015  Component Date Value Ref Range Status  . WBC 10/03/2015 7.1  3.8 - 10.6 K/uL Final  . RBC 10/03/2015 4.87  4.40 - 5.90 MIL/uL Final  . Hemoglobin 10/03/2015 15.2  13.0 - 18.0 g/dL Final  . HCT 10/03/2015 44.0  40.0 - 52.0 % Final  . MCV 10/03/2015 90.3  80.0 - 100.0 fL Final  . MCH 10/03/2015 31.1  26.0 - 34.0 pg Final  . MCHC 10/03/2015 34.5  32.0 - 36.0 g/dL Final  . RDW 10/03/2015 13.8  11.5 - 14.5 % Final  . Platelets 10/03/2015 239  150 - 440 K/uL Final  . Neutrophils Relative % 10/03/2015 68   Final  . Neutro Abs 10/03/2015 4.9  1.4 - 6.5 K/uL Final  . Lymphocytes Relative 10/03/2015 20   Final  . Lymphs Abs 10/03/2015 1.4  1.0 - 3.6 K/uL Final  . Monocytes Relative 10/03/2015 9   Final  . Monocytes Absolute 10/03/2015 0.6  0.2 - 1.0 K/uL Final  . Eosinophils Relative 10/03/2015 2   Final  . Eosinophils Absolute 10/03/2015 0.1  0 - 0.7 K/uL Final  . Basophils Relative 10/03/2015 1   Final  . Basophils Absolute 10/03/2015 0.1  0 - 0.1 K/uL Final  . Sodium 10/03/2015 137  135 - 145 mmol/L Final  . Potassium 10/03/2015 3.9  3.5 - 5.1 mmol/L Final  . Chloride 10/03/2015 100* 101 - 111 mmol/L Final  . CO2 10/03/2015 27  22 - 32 mmol/L Final  . Glucose, Bld 10/03/2015 101* 65 - 99 mg/dL Final  . BUN 10/03/2015 16  6 - 20 mg/dL Final  . Creatinine, Ser  10/03/2015 0.95  0.61 - 1.24 mg/dL Final  . Calcium 10/03/2015 9.4  8.9 - 10.3 mg/dL Final  . Total Protein 10/03/2015 7.5  6.5 - 8.1 g/dL Final  . Albumin 10/03/2015 3.9  3.5 - 5.0 g/dL Final  . AST 10/03/2015 42* 15 - 41 U/L Final  . ALT 10/03/2015 46  17 - 63 U/L Final  . Alkaline Phosphatase 10/03/2015 67  38 - 126 U/L Final  . Total Bilirubin 10/03/2015 0.7  0.3 - 1.2 mg/dL Final  . GFR calc non Af Amer 10/03/2015 >60  >60 mL/min Final  . GFR calc Af Amer 10/03/2015 >60  >60 mL/min Final   Comment: (NOTE) The eGFR has been calculated using the CKD EPI equation. This calculation has not been validated in all clinical situations. eGFR's persistently <60 mL/min signify possible Chronic Kidney Disease.   Georgiann Hahn  gap 10/03/2015 10  5 - 15 Final    Assessment:  Kevin Glover is a 75 y.o. male with stage I (T2N0M0) left larynx cancer s/p biopsy on 03/22/2015.  Pathology revealed well-differentiated squamous cell carcinoma.  PET scan on 04/03/2015 revealed no definite vocal cord mass or wall hypermetabolism.  There were no evidence of metastatic disease.  He received 66 Gy to the vocal cord from 04/30/2015 - 06/13/2015.   Recent endoscopy was negative.   Symptomatically, he notes a slight clear cough. He notes issues keeping his weight up.  Appetite is good.  He denies any change in his voice or swallowing issues.  Plan: 1.  Review entire medical history, diagnosis and management of larynx cancer. 2.  Labs today:  CBC with diff, CMP. 3.  RTC 4 months for MD assessment and labs (CBC with diff, CMP, TSH, free T4).   Lequita Asal, MD  10/03/2015, 11:14 AM

## 2015-10-03 NOTE — Progress Notes (Signed)
Radiation Oncology Follow up Note  Name: ROWLAND SOONG   Date:   10/03/2015 MRN:  [id number] DOB: 10-30-1940    This 75 y.o. male presents to the clinic today for four-month follow-up for squamous cell carcinoma of the larynx.  REFERRING PROVIDER: Tracie Harrier, MD  HPI: Patient is a 75 year old male now out 4 months having completed external beam radiation therapy for stage I squamous cell carcinoma the larynx. Seen today in routine follow-up he is doing well specifically denies dysphagia or head and neck pain does have a productive cough clear sometimes causing slight choking. I have suggested Mucinex for that. He recently saw ENT showing no evidence of disease on examination.  COMPLICATIONS OF TREATMENT: none  FOLLOW UP COMPLIANCE: keeps appointments   PHYSICAL EXAM:  BP 116/78 mmHg  Pulse 70  Temp(Src) 96.6 F (35.9 C)  Resp 20  Wt 178 lb 14.5 oz (81.15 kg) Oral cavity is clear or no oral mucosal lesions are identified indirect mirror examination shows cord approximating well no evidence of upper airway disease or disease of the larynx there still some slight laryngeal edema. Neck is clear without evidence of subject gastric cervical or supraclavicular adenopathy. Well-developed well-nourished patient in NAD. HEENT reveals PERLA, EOMI, discs not visualized.  Oral cavity is clear. No oral mucosal lesions are identified. Neck is clear without evidence of cervical or supraclavicular adenopathy. Lungs are clear to A&P. Cardiac examination is essentially unremarkable with regular rate and rhythm without murmur rub or thrill. Abdomen is benign with no organomegaly or masses noted. Motor sensory and DTR levels are equal and symmetric in the upper and lower extremities. Cranial nerves II through XII are grossly intact. Proprioception is intact. No peripheral adenopathy or edema is identified. No motor or sensory levels are noted. Crude visual fields are within normal range.  RADIOLOGY  RESULTS: No current films for review  PLAN: Present time he continues to do well with no evidence of disease. I've asked to see him back in 6 months for follow-up. I have suggested Mucinex for his slight productive cough. Otherwise I'm please was overall progress. Patient knows to call sooner with any concerns.  I would like to take this opportunity to thank you for allowing me to participate in the care of your patient.Armstead Peaks., MD

## 2015-10-03 NOTE — Progress Notes (Signed)
Pt reports a slight cough with clear mucous.  Per pt he stated that it is related to his radiation

## 2015-10-04 LAB — T4: T4, Total: 7.1 ug/dL (ref 4.5–12.0)

## 2015-10-09 ENCOUNTER — Ambulatory Visit: Payer: Commercial Managed Care - HMO | Admitting: Radiation Oncology

## 2015-10-16 DIAGNOSIS — Z125 Encounter for screening for malignant neoplasm of prostate: Secondary | ICD-10-CM | POA: Diagnosis not present

## 2015-10-16 DIAGNOSIS — N529 Male erectile dysfunction, unspecified: Secondary | ICD-10-CM | POA: Diagnosis not present

## 2015-10-16 DIAGNOSIS — C320 Malignant neoplasm of glottis: Secondary | ICD-10-CM | POA: Diagnosis not present

## 2015-10-23 DIAGNOSIS — R972 Elevated prostate specific antigen [PSA]: Secondary | ICD-10-CM | POA: Diagnosis not present

## 2015-10-23 DIAGNOSIS — N390 Urinary tract infection, site not specified: Secondary | ICD-10-CM | POA: Diagnosis not present

## 2015-10-23 DIAGNOSIS — E7800 Pure hypercholesterolemia, unspecified: Secondary | ICD-10-CM | POA: Diagnosis not present

## 2015-10-23 DIAGNOSIS — L570 Actinic keratosis: Secondary | ICD-10-CM | POA: Diagnosis not present

## 2015-10-23 DIAGNOSIS — C320 Malignant neoplasm of glottis: Secondary | ICD-10-CM | POA: Diagnosis not present

## 2015-11-06 DIAGNOSIS — L570 Actinic keratosis: Secondary | ICD-10-CM | POA: Diagnosis not present

## 2015-11-06 DIAGNOSIS — C320 Malignant neoplasm of glottis: Secondary | ICD-10-CM | POA: Diagnosis not present

## 2015-11-06 DIAGNOSIS — E7800 Pure hypercholesterolemia, unspecified: Secondary | ICD-10-CM | POA: Diagnosis not present

## 2015-11-06 DIAGNOSIS — R972 Elevated prostate specific antigen [PSA]: Secondary | ICD-10-CM | POA: Diagnosis not present

## 2015-11-06 DIAGNOSIS — N390 Urinary tract infection, site not specified: Secondary | ICD-10-CM | POA: Diagnosis not present

## 2015-11-15 ENCOUNTER — Ambulatory Visit: Payer: Self-pay

## 2015-11-25 DIAGNOSIS — R972 Elevated prostate specific antigen [PSA]: Secondary | ICD-10-CM | POA: Diagnosis not present

## 2015-11-25 DIAGNOSIS — C320 Malignant neoplasm of glottis: Secondary | ICD-10-CM | POA: Diagnosis not present

## 2015-11-25 DIAGNOSIS — N390 Urinary tract infection, site not specified: Secondary | ICD-10-CM | POA: Diagnosis not present

## 2015-11-25 DIAGNOSIS — E7800 Pure hypercholesterolemia, unspecified: Secondary | ICD-10-CM | POA: Diagnosis not present

## 2015-11-25 DIAGNOSIS — L570 Actinic keratosis: Secondary | ICD-10-CM | POA: Diagnosis not present

## 2015-12-11 DIAGNOSIS — C329 Malignant neoplasm of larynx, unspecified: Secondary | ICD-10-CM | POA: Diagnosis not present

## 2015-12-11 DIAGNOSIS — R490 Dysphonia: Secondary | ICD-10-CM | POA: Diagnosis not present

## 2016-01-01 DIAGNOSIS — L570 Actinic keratosis: Secondary | ICD-10-CM | POA: Diagnosis not present

## 2016-01-01 DIAGNOSIS — Z85828 Personal history of other malignant neoplasm of skin: Secondary | ICD-10-CM | POA: Diagnosis not present

## 2016-01-01 DIAGNOSIS — L718 Other rosacea: Secondary | ICD-10-CM | POA: Diagnosis not present

## 2016-01-01 DIAGNOSIS — D225 Melanocytic nevi of trunk: Secondary | ICD-10-CM | POA: Diagnosis not present

## 2016-01-01 DIAGNOSIS — X32XXXA Exposure to sunlight, initial encounter: Secondary | ICD-10-CM | POA: Diagnosis not present

## 2016-01-01 DIAGNOSIS — Z08 Encounter for follow-up examination after completed treatment for malignant neoplasm: Secondary | ICD-10-CM | POA: Diagnosis not present

## 2016-01-05 ENCOUNTER — Encounter: Payer: Self-pay | Admitting: Hematology and Oncology

## 2016-02-03 ENCOUNTER — Inpatient Hospital Stay: Payer: Commercial Managed Care - HMO

## 2016-02-03 ENCOUNTER — Inpatient Hospital Stay: Payer: Commercial Managed Care - HMO | Admitting: Hematology and Oncology

## 2016-03-06 DIAGNOSIS — H2513 Age-related nuclear cataract, bilateral: Secondary | ICD-10-CM | POA: Diagnosis not present

## 2016-04-17 DIAGNOSIS — E039 Hypothyroidism, unspecified: Secondary | ICD-10-CM | POA: Diagnosis not present

## 2016-04-23 ENCOUNTER — Ambulatory Visit: Payer: Commercial Managed Care - HMO | Admitting: Radiation Oncology

## 2016-05-26 DIAGNOSIS — H2513 Age-related nuclear cataract, bilateral: Secondary | ICD-10-CM | POA: Diagnosis not present

## 2016-05-26 DIAGNOSIS — Z85828 Personal history of other malignant neoplasm of skin: Secondary | ICD-10-CM | POA: Diagnosis not present

## 2016-05-26 DIAGNOSIS — Z08 Encounter for follow-up examination after completed treatment for malignant neoplasm: Secondary | ICD-10-CM | POA: Diagnosis not present

## 2016-05-26 DIAGNOSIS — L718 Other rosacea: Secondary | ICD-10-CM | POA: Diagnosis not present

## 2016-05-28 ENCOUNTER — Encounter: Payer: Self-pay | Admitting: *Deleted

## 2016-05-29 NOTE — Discharge Instructions (Signed)
Cataract Surgery, Care After °Refer to this sheet in the next few weeks. These instructions provide you with information about caring for yourself after your procedure. Your health care provider may also give you more specific instructions. Your treatment has been planned according to current medical practices, but problems sometimes occur. Call your health care provider if you have any problems or questions after your procedure. °What can I expect after the procedure? °After the procedure, it is common to have: °· Itching. °· Discomfort. °· Fluid discharge. °· Sensitivity to light and to touch. °· Bruising. °Follow these instructions at home: °Eye Care  °· Check your eye every day for signs of infection. Watch for: °¨ Redness, swelling, or pain. °¨ Fluid, blood, or pus. °¨ Warmth. °¨ Bad smell. °Activity  °· Avoid strenuous activities, such as playing contact sports, for as long as told by your health care provider. °· Do not drive or operate heavy machinery until your health care provider approves. °· Do not bend or lift heavy objects . Bending increases pressure in the eye. You can walk, climb stairs, and do light household chores. °· Ask your health care provider when you can return to work. If you work in a dusty environment, you may be advised to wear protective eyewear for a period of time. °General instructions  °· Take or apply over-the-counter and prescription medicines only as told by your health care provider. This includes eye drops. °· Do not touch or rub your eyes. °· If you were given a protective shield, wear it as told by your health care provider. If you were not given a protective shield, wear sunglasses as told by your health care provider to protect your eyes. °· Keep the area around your eye clean and dry. Avoid swimming or allowing water to hit you directly in the face while showering until told by your health care provider. Keep soap and shampoo out of your eyes. °· Do not put a contact lens  into the affected eye or eyes until your health care provider approves. °· Keep all follow-up visits as told by your health care provider. This is important. °Contact a health care provider if: ° °· You have increased bruising around your eye. °· You have pain that is not helped with medicine. °· You have a fever. °· You have redness, swelling, or pain in your eye. °· You have fluid, blood, or pus coming from your incision. °· Your vision gets worse. °Get help right away if: °· You have sudden vision loss. °This information is not intended to replace advice given to you by your health care provider. Make sure you discuss any questions you have with your health care provider. °Document Released: 10/24/2004 Document Revised: 08/15/2015 Document Reviewed: 02/14/2015 °Elsevier Interactive Patient Education © 2017 [employer] ° ° ° ° °General Anesthesia, Adult, Care After °These instructions provide you with information about caring for yourself after your procedure. Your health care provider may also give you more specific instructions. Your treatment has been planned according to current medical practices, but problems sometimes occur. Call your health care provider if you have any problems or questions after your procedure. °What can I expect after the procedure? °After the procedure, it is common to have: °· Vomiting. °· A sore throat. °· Mental slowness. °It is common to feel: °· Nauseous. °· Cold or shivery. °· Sleepy. °· Tired. °· Sore or achy, even in parts of your body where you did not have surgery. °Follow these instructions at   home: °For at least 24 hours after the procedure:  °· Do not: °¨ Participate in activities where you could fall or become injured. °¨ Drive. °¨ Use heavy machinery. °¨ Drink alcohol. °¨ Take sleeping pills or medicines that cause drowsiness. °¨ Make important decisions or sign legal documents. °¨ Take care of children on your own. °· Rest. °Eating and drinking  °· If you vomit, drink  water, juice, or soup when you can drink without vomiting. °· Drink enough fluid to keep your urine clear or pale yellow. °· Make sure you have little or no nausea before eating solid foods. °· Follow the diet recommended by your health care provider. °General instructions  °· Have a responsible adult stay with you until you are awake and alert. °· Return to your normal activities as told by your health care provider. Ask your health care provider what activities are safe for you. °· Take over-the-counter and prescription medicines only as told by your health care provider. °· If you smoke, do not smoke without supervision. °· Keep all follow-up visits as told by your health care provider. This is important. °Contact a health care provider if: °· You continue to have nausea or vomiting at home, and medicines are not helpful. °· You cannot drink fluids or start eating again. °· You cannot urinate after 8-12 hours. °· You develop a skin rash. °· You have fever. °· You have increasing redness at the site of your procedure. °Get help right away if: °· You have difficulty breathing. °· You have chest pain. °· You have unexpected bleeding. °· You feel that you are having a life-threatening or urgent problem. °This information is not intended to replace advice given to you by your health care provider. Make sure you discuss any questions you have with your health care provider. °Document Released: 07/13/2000 Document Revised: 09/09/2015 Document Reviewed: 03/21/2015 °Elsevier Interactive Patient Education © 2017 [employer] ° °

## 2016-06-02 ENCOUNTER — Ambulatory Visit
Admission: RE | Admit: 2016-06-02 | Discharge: 2016-06-02 | Disposition: A | Discharge status: Home / Self Care | Payer: Medicare HMO | Source: Ambulatory Visit | Attending: Ophthalmology | Admitting: Ophthalmology

## 2016-06-02 ENCOUNTER — Ambulatory Visit: Payer: Medicare HMO | Admitting: Anesthesiology

## 2016-06-02 ENCOUNTER — Encounter
Admission: RE | Disposition: A | Discharge status: Home / Self Care | Payer: Self-pay | Source: Ambulatory Visit | Attending: Ophthalmology

## 2016-06-02 DIAGNOSIS — Z87891 Personal history of nicotine dependence: Secondary | ICD-10-CM | POA: Diagnosis not present

## 2016-06-02 DIAGNOSIS — M1990 Unspecified osteoarthritis, unspecified site: Secondary | ICD-10-CM | POA: Diagnosis not present

## 2016-06-02 DIAGNOSIS — E039 Hypothyroidism, unspecified: Secondary | ICD-10-CM | POA: Insufficient documentation

## 2016-06-02 DIAGNOSIS — H2511 Age-related nuclear cataract, right eye: Secondary | ICD-10-CM | POA: Diagnosis not present

## 2016-06-02 DIAGNOSIS — H2513 Age-related nuclear cataract, bilateral: Secondary | ICD-10-CM | POA: Diagnosis not present

## 2016-06-02 HISTORY — PX: CATARACT EXTRACTION W/PHACO: SHX586

## 2016-06-02 HISTORY — DX: Hypothyroidism, unspecified: E03.9

## 2016-06-02 SURGERY — PHACOEMULSIFICATION, CATARACT, WITH IOL INSERTION
Anesthesia: Monitor Anesthesia Care | Laterality: Right | Wound class: Clean

## 2016-06-02 MED ORDER — LIDOCAINE HCL (PF) 2 % IJ SOLN
INTRAOCULAR | Status: DC | PRN
  Administered 2016-06-02: 1 mL via INTRAOCULAR

## 2016-06-02 MED ORDER — SODIUM HYALURONATE 10 MG/ML IO SOLN
INTRAOCULAR | Status: DC | PRN
  Administered 2016-06-02: 0.55 mL via INTRAOCULAR

## 2016-06-02 MED ORDER — MIDAZOLAM HCL 2 MG/2ML IJ SOLN
INTRAMUSCULAR | Status: DC | PRN
  Administered 2016-06-02: 2 mg via INTRAVENOUS

## 2016-06-02 MED ORDER — ARMC OPHTHALMIC DILATING DROPS
1.0000 "application " | OPHTHALMIC | Status: DC | PRN
  Administered 2016-06-02 (×3): 1 via OPHTHALMIC

## 2016-06-02 MED ORDER — SODIUM HYALURONATE 23 MG/ML IO SOLN
INTRAOCULAR | Status: DC | PRN
  Administered 2016-06-02: 0.6 mL via INTRAOCULAR

## 2016-06-02 MED ORDER — FENTANYL CITRATE (PF) 100 MCG/2ML IJ SOLN
INTRAMUSCULAR | Status: DC | PRN
  Administered 2016-06-02: 50 ug via INTRAVENOUS

## 2016-06-02 MED ORDER — MOXIFLOXACIN HCL 0.5 % OP SOLN
OPHTHALMIC | Status: DC | PRN
  Administered 2016-06-02: 0.2 mL via OPHTHALMIC

## 2016-06-02 MED ORDER — EPINEPHRINE PF 1 MG/ML IJ SOLN
INTRAMUSCULAR | Status: DC | PRN
  Administered 2016-06-02: 98 mL via OPHTHALMIC

## 2016-06-02 SURGICAL SUPPLY — 18 items
CANNULA ANT/CHMB 27GA (MISCELLANEOUS) ×3 IMPLANT
CUP MEDICINE 2OZ PLAST GRAD ST (MISCELLANEOUS) ×3 IMPLANT
DISSECTOR HYDRO NUCLEUS 50X22 (MISCELLANEOUS) ×3 IMPLANT
GLOVE BIO SURGEON STRL SZ8 (GLOVE) ×3 IMPLANT
GLOVE SURG LX 7.5 STRW (GLOVE) ×2
GLOVE SURG LX STRL 7.5 STRW (GLOVE) ×1 IMPLANT
GOWN STRL REUS W/ TWL LRG LVL3 (GOWN DISPOSABLE) ×2 IMPLANT
GOWN STRL REUS W/TWL LRG LVL3 (GOWN DISPOSABLE) ×4
LENS IOL TECNIS ITEC 20.5 (Intraocular Lens) ×3 IMPLANT
MARKER SKIN DUAL TIP RULER LAB (MISCELLANEOUS) ×3 IMPLANT
PACK CATARACT (MISCELLANEOUS) ×3 IMPLANT
PACK CATARACT BRASINGTON (MISCELLANEOUS) ×3 IMPLANT
PACK EYE AFTER SURG (MISCELLANEOUS) ×3 IMPLANT
SYR 3ML LL SCALE MARK (SYRINGE) ×3 IMPLANT
SYR TB 1ML LUER SLIP (SYRINGE) ×3 IMPLANT
WATER STERILE IRR 250ML POUR (IV SOLUTION) ×3 IMPLANT
WICK EYE OCUCEL (MISCELLANEOUS) ×3 IMPLANT
WIPE NON LINTING 3.25X3.25 (MISCELLANEOUS) ×3 IMPLANT

## 2016-06-02 NOTE — Anesthesia Postprocedure Evaluation (Signed)
Anesthesia Post Note  Patient: Kevin Glover  Procedure(s) Performed: Procedure(s) (LRB): CATARACT EXTRACTION PHACO AND INTRAOCULAR LENS PLACEMENT (IOC) right (Right)  Patient location during evaluation: PACU Anesthesia Type: MAC Level of consciousness: awake and alert and oriented Pain management: pain level controlled Vital Signs Assessment: post-procedure vital signs reviewed and stable Respiratory status: spontaneous breathing and nonlabored ventilation Cardiovascular status: stable Postop Assessment: no signs of nausea or vomiting and adequate PO intake Anesthetic complications: no    Estill Batten

## 2016-06-02 NOTE — Op Note (Signed)
OPERATIVE NOTE  Kevin Glover [id number] 06/02/2016   PREOPERATIVE DIAGNOSIS:  Nuclear sclerotic cataract right eye.  H25.11   POSTOPERATIVE DIAGNOSIS:    Nuclear sclerotic cataract right eye.     PROCEDURE:  Phacoemusification with posterior chamber intraocular lens placement of the right eye   LENS:   Implant Name Type Inv. Item Serial No. Manufacturer Lot No. LRB No. Used  PCBOO 20.5D lens     [id number] [facility]   Right 1       PCB00 +20.5   ULTRASOUND TIME: 1 minutes 08 seconds.  CDE 11.27   SURGEON:  Benay Pillow, MD, MPH  ANESTHESIOLOGIST: Anesthesiologist: Estill Batten, DO CRNA: Mayme Genta, CRNA   ANESTHESIA:  Topical with tetracaine drops augmented with 1% preservative-free intracameral lidocaine.  ESTIMATED BLOOD LOSS: less than 1 mL.   COMPLICATIONS:  None.   DESCRIPTION OF PROCEDURE:  The patient was identified in the holding room and transported to the operating room and placed in the supine position under the operating microscope.  The right eye was identified as the operative eye and it was prepped and draped in the usual sterile ophthalmic fashion.  The orbit was deep and created a pool of fluid around the eye.  A sterile wick was placed to help drain fluid from around the eye to improve the view.   A 1.0 millimeter clear-corneal paracentesis was made at the 10:30 position. 0.5 ml of preservative-free 1% lidocaine with epinephrine was injected into the anterior chamber.  The anterior chamber was filled with Healon 5 viscoelastic.  A 2.4 millimeter keratome was used to make a near-clear corneal incision at the 8:00 position.  A curvilinear capsulorrhexis was made with a cystotome and capsulorrhexis forceps.  Balanced salt solution was used to hydrodissect and hydrodelineate the nucleus.   Phacoemulsification was then used in stop and chop fashion to remove the lens nucleus and epinucleus.  The remaining cortex was then removed using the irrigation and  aspiration handpiece. Healon was then placed into the capsular bag to distend it for lens placement.  A lens was then injected into the capsular bag.  The remaining viscoelastic was aspirated.   Wounds were hydrated with balanced salt solution.  The anterior chamber was inflated to a physiologic pressure with balanced salt solution.   Intracameral vigamox 0.1 mL undiluted was injected into the eye and a drop placed onto the ocular surface.  No wound leaks were noted.  The patient was taken to the recovery room in stable condition without complications of anesthesia or surgery  Benay Pillow 06/02/2016, 8:40 AM

## 2016-06-02 NOTE — Transfer of Care (Signed)
Immediate Anesthesia Transfer of Care Note  Patient: Kevin Glover  Procedure(s) Performed: Procedure(s): CATARACT EXTRACTION PHACO AND INTRAOCULAR LENS PLACEMENT (IOC) right (Right)  Patient Location: PACU  Anesthesia Type: MAC  Level of Consciousness: awake, alert  and patient cooperative  Airway and Oxygen Therapy: Patient Spontanous Breathing and Patient connected to supplemental oxygen  Post-op Assessment: Post-op Vital signs reviewed, Patient's Cardiovascular Status Stable, Respiratory Function Stable, Patent Airway and No signs of Nausea or vomiting  Post-op Vital Signs: Reviewed and stable  Complications: No apparent anesthesia complications

## 2016-06-02 NOTE — Anesthesia Procedure Notes (Signed)
Procedure Name: MAC Performed by: Mayme Genta Pre-anesthesia Checklist: Patient identified, Emergency Drugs available, Suction available, Timeout performed and Patient being monitored Patient Re-evaluated:Patient Re-evaluated prior to inductionOxygen Delivery Method: Nasal cannula Placement Confirmation: positive ETCO2

## 2016-06-02 NOTE — Anesthesia Preprocedure Evaluation (Addendum)
Anesthesia Evaluation  Patient identified by MRN, date of birth, ID band Patient awake    Reviewed: Allergy & Precautions, NPO status , Patient's Chart, lab work & pertinent test results  Airway Mallampati: I  TM Distance: >3 FB Neck ROM: Full    Dental  (+) Partial Upper   Pulmonary former smoker,    Pulmonary exam normal        Cardiovascular negative cardio ROS Normal cardiovascular exam     Neuro/Psych negative neurological ROS  negative psych ROS   GI/Hepatic negative GI ROS, Neg liver ROS,   Endo/Other  Hypothyroidism   Renal/GU negative Renal ROS     Musculoskeletal  (+) Arthritis ,   Abdominal   Peds  Hematology negative hematology ROS (+)   Anesthesia Other Findings   Reproductive/Obstetrics                            Anesthesia Physical Anesthesia Plan  ASA: II  Anesthesia Plan: MAC   Post-op Pain Management:    Induction: Intravenous  Airway Management Planned:   Additional Equipment:   Intra-op Plan:   Post-operative Plan:   Informed Consent: I have reviewed the patients History and Physical, chart, labs and discussed the procedure including the risks, benefits and alternatives for the proposed anesthesia with the patient or authorized representative who has indicated his/her understanding and acceptance.     Plan Discussed with: CRNA  Anesthesia Plan Comments:         Anesthesia Quick Evaluation

## 2016-06-02 NOTE — H&P (Signed)
The History and Physical notes are on paper, have been signed, and are to be scanned. The patient remains stable and unchanged from the H&P.   Previous H&P reviewed, patient examined, and there are no changes.  Kevin Glover 06/02/2016 7:22 AM

## 2016-06-03 ENCOUNTER — Encounter: Payer: Self-pay | Admitting: Ophthalmology

## 2016-06-15 DIAGNOSIS — R490 Dysphonia: Secondary | ICD-10-CM | POA: Diagnosis not present

## 2016-06-15 DIAGNOSIS — C329 Malignant neoplasm of larynx, unspecified: Secondary | ICD-10-CM | POA: Diagnosis not present

## 2016-06-17 DIAGNOSIS — Z125 Encounter for screening for malignant neoplasm of prostate: Secondary | ICD-10-CM | POA: Diagnosis not present

## 2016-06-17 DIAGNOSIS — E7800 Pure hypercholesterolemia, unspecified: Secondary | ICD-10-CM | POA: Diagnosis not present

## 2016-06-17 DIAGNOSIS — Z0000 Encounter for general adult medical examination without abnormal findings: Secondary | ICD-10-CM | POA: Diagnosis not present

## 2016-06-23 DIAGNOSIS — H2512 Age-related nuclear cataract, left eye: Secondary | ICD-10-CM | POA: Diagnosis not present

## 2016-06-24 ENCOUNTER — Encounter: Payer: Self-pay | Admitting: *Deleted

## 2016-06-24 DIAGNOSIS — Z0000 Encounter for general adult medical examination without abnormal findings: Secondary | ICD-10-CM | POA: Diagnosis not present

## 2016-06-24 DIAGNOSIS — R972 Elevated prostate specific antigen [PSA]: Secondary | ICD-10-CM | POA: Diagnosis not present

## 2016-06-24 DIAGNOSIS — E039 Hypothyroidism, unspecified: Secondary | ICD-10-CM | POA: Diagnosis not present

## 2016-06-24 DIAGNOSIS — E7800 Pure hypercholesterolemia, unspecified: Secondary | ICD-10-CM | POA: Diagnosis not present

## 2016-06-24 DIAGNOSIS — C320 Malignant neoplasm of glottis: Secondary | ICD-10-CM | POA: Diagnosis not present

## 2016-06-26 NOTE — Discharge Instructions (Signed)
Cataract Surgery, Care After °Refer to this sheet in the next few weeks. These instructions provide you with information about caring for yourself after your procedure. Your health care provider may also give you more specific instructions. Your treatment has been planned according to current medical practices, but problems sometimes occur. Call your health care provider if you have any problems or questions after your procedure. °What can I expect after the procedure? °After the procedure, it is common to have: °· Itching. °· Discomfort. °· Fluid discharge. °· Sensitivity to light and to touch. °· Bruising. °Follow these instructions at home: °Eye Care  °· Check your eye every day for signs of infection. Watch for: °¨ Redness, swelling, or pain. °¨ Fluid, blood, or pus. °¨ Warmth. °¨ Bad smell. °Activity  °· Avoid strenuous activities, such as playing contact sports, for as long as told by your health care provider. °· Do not drive or operate heavy machinery until your health care provider approves. °· Do not bend or lift heavy objects . Bending increases pressure in the eye. You can walk, climb stairs, and do light household chores. °· Ask your health care provider when you can return to work. If you work in a dusty environment, you may be advised to wear protective eyewear for a period of time. °General instructions  °· Take or apply over-the-counter and prescription medicines only as told by your health care provider. This includes eye drops. °· Do not touch or rub your eyes. °· If you were given a protective shield, wear it as told by your health care provider. If you were not given a protective shield, wear sunglasses as told by your health care provider to protect your eyes. °· Keep the area around your eye clean and dry. Avoid swimming or allowing water to hit you directly in the face while showering until told by your health care provider. Keep soap and shampoo out of your eyes. °· Do not put a contact lens  into the affected eye or eyes until your health care provider approves. °· Keep all follow-up visits as told by your health care provider. This is important. °Contact a health care provider if: ° °· You have increased bruising around your eye. °· You have pain that is not helped with medicine. °· You have a fever. °· You have redness, swelling, or pain in your eye. °· You have fluid, blood, or pus coming from your incision. °· Your vision gets worse. °Get help right away if: °· You have sudden vision loss. °This information is not intended to replace advice given to you by your health care provider. Make sure you discuss any questions you have with your health care provider. °Document Released: 10/24/2004 Document Revised: 08/15/2015 Document Reviewed: 02/14/2015 °Elsevier Interactive Patient Education © 2017 [employer] ° ° ° ° °General Anesthesia, Adult, Care After °These instructions provide you with information about caring for yourself after your procedure. Your health care provider may also give you more specific instructions. Your treatment has been planned according to current medical practices, but problems sometimes occur. Call your health care provider if you have any problems or questions after your procedure. °What can I expect after the procedure? °After the procedure, it is common to have: °· Vomiting. °· A sore throat. °· Mental slowness. °It is common to feel: °· Nauseous. °· Cold or shivery. °· Sleepy. °· Tired. °· Sore or achy, even in parts of your body where you did not have surgery. °Follow these instructions at   home: °For at least 24 hours after the procedure:  °· Do not: °¨ Participate in activities where you could fall or become injured. °¨ Drive. °¨ Use heavy machinery. °¨ Drink alcohol. °¨ Take sleeping pills or medicines that cause drowsiness. °¨ Make important decisions or sign legal documents. °¨ Take care of children on your own. °· Rest. °Eating and drinking  °· If you vomit, drink  water, juice, or soup when you can drink without vomiting. °· Drink enough fluid to keep your urine clear or pale yellow. °· Make sure you have little or no nausea before eating solid foods. °· Follow the diet recommended by your health care provider. °General instructions  °· Have a responsible adult stay with you until you are awake and alert. °· Return to your normal activities as told by your health care provider. Ask your health care provider what activities are safe for you. °· Take over-the-counter and prescription medicines only as told by your health care provider. °· If you smoke, do not smoke without supervision. °· Keep all follow-up visits as told by your health care provider. This is important. °Contact a health care provider if: °· You continue to have nausea or vomiting at home, and medicines are not helpful. °· You cannot drink fluids or start eating again. °· You cannot urinate after 8-12 hours. °· You develop a skin rash. °· You have fever. °· You have increasing redness at the site of your procedure. °Get help right away if: °· You have difficulty breathing. °· You have chest pain. °· You have unexpected bleeding. °· You feel that you are having a life-threatening or urgent problem. °This information is not intended to replace advice given to you by your health care provider. Make sure you discuss any questions you have with your health care provider. °Document Released: 07/13/2000 Document Revised: 09/09/2015 Document Reviewed: 03/21/2015 °Elsevier Interactive Patient Education © 2017 [employer] ° °

## 2016-06-30 ENCOUNTER — Encounter
Admission: RE | Disposition: A | Discharge status: Home / Self Care | Payer: Self-pay | Source: Ambulatory Visit | Attending: Ophthalmology

## 2016-06-30 ENCOUNTER — Ambulatory Visit: Payer: Medicare HMO | Admitting: Anesthesiology

## 2016-06-30 ENCOUNTER — Encounter: Payer: Self-pay | Admitting: *Deleted

## 2016-06-30 ENCOUNTER — Ambulatory Visit
Admission: RE | Admit: 2016-06-30 | Discharge: 2016-06-30 | Disposition: A | Discharge status: Home / Self Care | Payer: Medicare HMO | Source: Ambulatory Visit | Attending: Ophthalmology | Admitting: Ophthalmology

## 2016-06-30 DIAGNOSIS — H2512 Age-related nuclear cataract, left eye: Secondary | ICD-10-CM | POA: Diagnosis not present

## 2016-06-30 DIAGNOSIS — M1990 Unspecified osteoarthritis, unspecified site: Secondary | ICD-10-CM | POA: Insufficient documentation

## 2016-06-30 DIAGNOSIS — Z87891 Personal history of nicotine dependence: Secondary | ICD-10-CM | POA: Insufficient documentation

## 2016-06-30 DIAGNOSIS — E039 Hypothyroidism, unspecified: Secondary | ICD-10-CM | POA: Insufficient documentation

## 2016-06-30 DIAGNOSIS — H2513 Age-related nuclear cataract, bilateral: Secondary | ICD-10-CM | POA: Diagnosis not present

## 2016-06-30 HISTORY — PX: CATARACT EXTRACTION W/PHACO: SHX586

## 2016-06-30 SURGERY — PHACOEMULSIFICATION, CATARACT, WITH IOL INSERTION
Anesthesia: Monitor Anesthesia Care | Laterality: Left | Wound class: Clean

## 2016-06-30 MED ORDER — ARMC OPHTHALMIC DILATING DROPS
1.0000 "application " | OPHTHALMIC | Status: DC | PRN
  Administered 2016-06-30 (×3): 1 via OPHTHALMIC

## 2016-06-30 MED ORDER — MOXIFLOXACIN HCL 0.5 % OP SOLN
OPHTHALMIC | Status: DC | PRN
  Administered 2016-06-30: 0.2 mL via OPHTHALMIC

## 2016-06-30 MED ORDER — EPINEPHRINE PF 1 MG/ML IJ SOLN
INTRAOCULAR | Status: DC | PRN
  Administered 2016-06-30: 93 mL via OPHTHALMIC

## 2016-06-30 MED ORDER — SODIUM HYALURONATE 10 MG/ML IO SOLN
INTRAOCULAR | Status: DC | PRN
  Administered 2016-06-30: 0.55 mL via INTRAOCULAR

## 2016-06-30 MED ORDER — SODIUM HYALURONATE 23 MG/ML IO SOLN
INTRAOCULAR | Status: DC | PRN
  Administered 2016-06-30: 0.6 mL via INTRAOCULAR

## 2016-06-30 MED ORDER — LIDOCAINE HCL (PF) 2 % IJ SOLN
INTRAOCULAR | Status: DC | PRN
  Administered 2016-06-30: 1 mL via INTRAOCULAR

## 2016-06-30 MED ORDER — FENTANYL CITRATE (PF) 100 MCG/2ML IJ SOLN
INTRAMUSCULAR | Status: DC | PRN
  Administered 2016-06-30 (×2): 50 ug via INTRAVENOUS

## 2016-06-30 MED ORDER — TETRACAINE HCL 0.5 % OP SOLN
OPHTHALMIC | Status: DC | PRN
  Administered 2016-06-30: 2 [drp] via OPHTHALMIC

## 2016-06-30 MED ORDER — MIDAZOLAM HCL 2 MG/2ML IJ SOLN
INTRAMUSCULAR | Status: DC | PRN
  Administered 2016-06-30: 2 mg via INTRAVENOUS

## 2016-06-30 SURGICAL SUPPLY — 18 items
CANNULA ANT/CHMB 27GA (MISCELLANEOUS) ×3 IMPLANT
CUP MEDICINE 2OZ PLAST GRAD ST (MISCELLANEOUS) ×3 IMPLANT
DISSECTOR HYDRO NUCLEUS 50X22 (MISCELLANEOUS) ×3 IMPLANT
GLOVE BIO SURGEON STRL SZ8 (GLOVE) ×3 IMPLANT
GLOVE SURG LX 7.5 STRW (GLOVE) ×2
GLOVE SURG LX STRL 7.5 STRW (GLOVE) ×1 IMPLANT
GOWN STRL REUS W/ TWL LRG LVL3 (GOWN DISPOSABLE) ×2 IMPLANT
GOWN STRL REUS W/TWL LRG LVL3 (GOWN DISPOSABLE) ×4
LENS IOL TECNIS ITEC 21.0 (Intraocular Lens) ×3 IMPLANT
MARKER SKIN DUAL TIP RULER LAB (MISCELLANEOUS) ×3 IMPLANT
PACK CATARACT (MISCELLANEOUS) ×3 IMPLANT
PACK CATARACT BRASINGTON (MISCELLANEOUS) ×3 IMPLANT
PACK EYE AFTER SURG (MISCELLANEOUS) ×3 IMPLANT
SYR 3ML LL SCALE MARK (SYRINGE) ×3 IMPLANT
SYR TB 1ML LUER SLIP (SYRINGE) ×3 IMPLANT
WATER STERILE IRR 250ML POUR (IV SOLUTION) ×3 IMPLANT
WICK EYE OCUCEL (MISCELLANEOUS) ×3 IMPLANT
WIPE NON LINTING 3.25X3.25 (MISCELLANEOUS) ×3 IMPLANT

## 2016-06-30 NOTE — H&P (Signed)
The History and Physical notes are on paper, have been signed, and are to be scanned.   I have examined the patient and there are no changes to the H&P.   Benay Pillow 06/30/2016 11:59 AM

## 2016-06-30 NOTE — Anesthesia Procedure Notes (Signed)
Procedure Name: MAC Performed by: Alayah Knouff Pre-anesthesia Checklist: Patient identified, Emergency Drugs available, Suction available, Timeout performed and Patient being monitored Patient Re-evaluated:Patient Re-evaluated prior to inductionOxygen Delivery Method: Nasal cannula Placement Confirmation: positive ETCO2     

## 2016-06-30 NOTE — Transfer of Care (Signed)
Immediate Anesthesia Transfer of Care Note  Patient: Kevin Glover  Procedure(s) Performed: Procedure(s): CATARACT EXTRACTION PHACO AND INTRAOCULAR LENS PLACEMENT (IOC)  Left (Left)  Patient Location: PACU  Anesthesia Type: MAC  Level of Consciousness: awake, alert  and patient cooperative  Airway and Oxygen Therapy: Patient Spontanous Breathing and Patient connected to supplemental oxygen  Post-op Assessment: Post-op Vital signs reviewed, Patient's Cardiovascular Status Stable, Respiratory Function Stable, Patent Airway and No signs of Nausea or vomiting  Post-op Vital Signs: Reviewed and stable  Complications: No apparent anesthesia complications

## 2016-06-30 NOTE — Op Note (Signed)
OPERATIVE NOTE  Kevin Glover [id number] 06/30/2016   PREOPERATIVE DIAGNOSIS:  Nuclear sclerotic cataract left eye.  H25.12   POSTOPERATIVE DIAGNOSIS:    Nuclear sclerotic cataract left eye.     PROCEDURE:  Phacoemusification with posterior chamber intraocular lens placement of the left eye   LENS:   Implant Name Type Inv. Item Serial No. Manufacturer Lot No. LRB No. Used  LENS IOL DIOP 21.0 - [id number] Intraocular Lens LENS IOL DIOP 21.0 [id number] AMO   Left 1       PCB00 +21.0   ULTRASOUND TIME: 1 minutes 21 seconds.  CDE 8.73   SURGEON:  Benay Pillow, MD, MPH   ANESTHESIA:  Topical with tetracaine drops augmented with 1% preservative-free intracameral lidocaine.  ESTIMATED BLOOD LOSS: <1 mL   COMPLICATIONS:  None.   DESCRIPTION OF PROCEDURE:  The patient was identified in the holding room and transported to the operating room and placed in the supine position under the operating microscope.  The left eye was identified as the operative eye and it was prepped and draped in the usual sterile ophthalmic fashion.   A 1.0 millimeter clear-corneal paracentesis was made at the 5:00 position. 0.5 ml of preservative-free 1% lidocaine with epinephrine was injected into the anterior chamber.  The anterior chamber was filled with Healon 5 viscoelastic.  A 2.4 millimeter keratome was used to make a near-clear corneal incision at the 2:00 position.  A curvilinear capsulorrhexis was made with a cystotome and capsulorrhexis forceps.  Balanced salt solution was used to hydrodissect and hydrodelineate the nucleus.  Throughout the entire case, there was significant pooling of fluid around the eye due to a deep orbit and narrow palpebral fissure.  Eye positioning and patient body movement was also a challenge.   Phacoemulsification was then used in stop and chop fashion to remove the lens nucleus and epinucleus.  The remaining cortex was then removed using the irrigation and aspiration  handpiece. Healon was then placed into the capsular bag to distend it for lens placement.  A lens was then injected into the capsular bag.  The remaining viscoelastic was aspirated.  The haptic was stuck onto the iol, and there was some difficulty in getting it to release.   Wounds were hydrated with balanced salt solution.  The anterior chamber was inflated to a physiologic pressure with balanced salt solution.   Intracameral vigamox 0.1 mL undiltued was injected into the eye and a drop placed onto the ocular surface.  No wound leaks were noted.  The patient was taken to the recovery room in stable condition without complications of anesthesia or surgery  Benay Pillow 06/30/2016, 12:59 PM

## 2016-06-30 NOTE — Anesthesia Postprocedure Evaluation (Signed)
Anesthesia Post Note  Patient: Kevin Glover  Procedure(s) Performed: Procedure(s) (LRB): CATARACT EXTRACTION PHACO AND INTRAOCULAR LENS PLACEMENT (IOC)  Left (Left)  Patient location during evaluation: PACU Anesthesia Type: MAC Level of consciousness: awake and alert and oriented Pain management: pain level controlled Vital Signs Assessment: post-procedure vital signs reviewed and stable Respiratory status: spontaneous breathing and nonlabored ventilation Cardiovascular status: stable Postop Assessment: no signs of nausea or vomiting and adequate PO intake Anesthetic complications: no    Estill Batten

## 2016-06-30 NOTE — Anesthesia Preprocedure Evaluation (Signed)
Anesthesia Evaluation  Patient identified by MRN, date of birth, ID band Patient awake    Reviewed: Allergy & Precautions, NPO status , Patient's Chart, lab work & pertinent test results  Airway Mallampati: I  TM Distance: >3 FB Neck ROM: Full    Dental  (+) Partial Upper   Pulmonary former smoker,    Pulmonary exam normal        Cardiovascular negative cardio ROS Normal cardiovascular exam     Neuro/Psych negative neurological ROS  negative psych ROS   GI/Hepatic negative GI ROS, Neg liver ROS,   Endo/Other  Hypothyroidism   Renal/GU negative Renal ROS     Musculoskeletal  (+) Arthritis ,   Abdominal   Peds  Hematology negative hematology ROS (+)   Anesthesia Other Findings   Reproductive/Obstetrics                             Anesthesia Physical  Anesthesia Plan  ASA: II  Anesthesia Plan: MAC   Post-op Pain Management:    Induction: Intravenous  Airway Management Planned:   Additional Equipment:   Intra-op Plan:   Post-operative Plan:   Informed Consent: I have reviewed the patients History and Physical, chart, labs and discussed the procedure including the risks, benefits and alternatives for the proposed anesthesia with the patient or authorized representative who has indicated his/her understanding and acceptance.     Plan Discussed with: CRNA  Anesthesia Plan Comments:         Anesthesia Quick Evaluation

## 2016-07-01 ENCOUNTER — Encounter: Payer: Self-pay | Admitting: Ophthalmology

## 2016-07-23 DIAGNOSIS — L574 Cutis laxa senilis: Secondary | ICD-10-CM | POA: Diagnosis not present

## 2016-07-23 DIAGNOSIS — H02834 Dermatochalasis of left upper eyelid: Secondary | ICD-10-CM | POA: Diagnosis not present

## 2016-07-23 DIAGNOSIS — H02831 Dermatochalasis of right upper eyelid: Secondary | ICD-10-CM | POA: Diagnosis not present

## 2016-07-24 ENCOUNTER — Ambulatory Visit: Payer: Medicare HMO | Admitting: Hematology and Oncology

## 2016-07-27 ENCOUNTER — Ambulatory Visit: Payer: Medicare HMO | Admitting: Hematology and Oncology

## 2016-09-15 ENCOUNTER — Encounter: Payer: Self-pay | Admitting: *Deleted

## 2016-09-17 NOTE — Discharge Instructions (Signed)
INSTRUCTIONS FOLLOWING OCULOPLASTIC SURGERY °AMY M. FOWLER, MD ° °AFTER YOUR EYE SURGERY, THER ARE MANY THINGS THWIHC YOU, THE PATIENT, CAN DO TO ASSURE THE BEST POSSIBLE RESULT FROM YOUR OPERATION.  THIS SHEET SHOULD BE REFERRED TO WHENEVER QUESTIONS ARISE.  IF THERE ARE ANY QUESTIONS NOT ANSWERED HERE, DO NOT HESITATE TO CALL OUR OFFICE AT [phone] OR [phone].  THERE IS ALWAYS [facility] AVAILABLE TO CALL IF QUESTIONS OR PROBLEMS ARISE. ° °VISION: Your vision may be blurred and out of focus after surgery until you are able to stop using your ointment, swelling resolves and your eye(s) heal. This may take 1 to 2 weeks at the least.  If your vision becomes gradually more dim or dark, this is not normal and you need to call our office immediately. ° °EYE CARE: For the first 48 hours after surgery, use ice packs frequently - “20 minutes on, 20 minutes off” - to help reduce swelling and bruising.  Small bags of frozen peas or corn make good ice packs along with cloths soaked in ice water.  If you are wearing a patch or other type of dressing following surgery, keep this on for the amount of time specified by your doctor.  For the first week following surgery, you will need to treat your stitches with great care.  If is OK to shower, but take care to not allow soapy water to run into your eye(s) to help reduce changes of infection.  You may gently clean the eyelashes and around the eye(s) with cotton balls and sterile water, BUT DO NOT RUB THE STITCHES VIGOROUSLY.  Keeping your stitches moist with ointment will help promote healing with minimal scar formation. ° °ACTIVITY: When you leave the surgery center, you should go home, rest and be inactive.  The eye(s) may feel scratchy and keeping the eyes closed will allow for faster healing.  The first week following surgery, avoid straining (anything making the face turn red) or lifting over 20 pounds.  Additionally, avoid bending which causes your head to go below  your waist.  Using your eyes will NOT harm them, so feel free to read, watch television, use the computer, etc as desired.  Driving depends on each individual, so check with your doctor if you have questions about driving. ° °MEDICATIONS:  You will be given a prescription for an ointment to use 4 times a day on your stitches.  You can use the ointment in your eyes if they feel scratchy or irritated.  If you eyelid(s) don’t close completely when you sleep, put some ointment in your eyes before bedtime. ° °EMERGENCY: If you experience SEVERE EYE PAIN OR HEADACHE UNRELIEVED BY TYLENOL OR PERCOCET, NAUSEA OR VOMITING, WORSENING REDNESS, OR WORSENING VISION (ESPECIALLY VISION THAT WA INITIALLY BETTER) CALL [phone] OR [phone] DURING BUSINESS HOURS OR AFTER HOURS. ° °General Anesthesia, Adult, Care After °These instructions provide you with information about caring for yourself after your procedure. Your health care provider may also give you more specific instructions. Your treatment has been planned according to current medical practices, but problems sometimes occur. Call your health care provider if you have any problems or questions after your procedure. °What can I expect after the procedure? °After the procedure, it is common to have: °· Vomiting. °· A sore throat. °· Mental slowness. ° °It is common to feel: °· Nauseous. °· Cold or shivery. °· Sleepy. °· Tired. °· Sore or achy, even in parts of your body where you did not have surgery. ° °  Follow these instructions at home: °For at least 24 hours after the procedure: °· Do not: °? Participate in activities where you could fall or become injured. °? Drive. °? Use heavy machinery. °? Drink alcohol. °? Take sleeping pills or medicines that cause drowsiness. °? Make important decisions or sign legal documents. °? Take care of children on your own. °· Rest. °Eating and drinking °· If you vomit, drink water, juice, or soup when you can drink without  vomiting. °· Drink enough fluid to keep your urine clear or pale yellow. °· Make sure you have little or no nausea before eating solid foods. °· Follow the diet recommended by your health care provider. °General instructions °· Have a responsible adult stay with you until you are awake and alert. °· Return to your normal activities as told by your health care provider. Ask your health care provider what activities are safe for you. °· Take over-the-counter and prescription medicines only as told by your health care provider. °· If you smoke, do not smoke without supervision. °· Keep all follow-up visits as told by your health care provider. This is important. °Contact a health care provider if: °· You continue to have nausea or vomiting at home, and medicines are not helpful. °· You cannot drink fluids or start eating again. °· You cannot urinate after 8-12 hours. °· You develop a skin rash. °· You have fever. °· You have increasing redness at the site of your procedure. °Get help right away if: °· You have difficulty breathing. °· You have chest pain. °· You have unexpected bleeding. °· You feel that you are having a life-threatening or urgent problem. °This information is not intended to replace advice given to you by your health care provider. Make sure you discuss any questions you have with your health care provider. °Document Released: 07/13/2000 Document Revised: 09/09/2015 Document Reviewed: 03/21/2015 °Elsevier Interactive Patient Education © 2018 [employer] ° °

## 2016-09-22 ENCOUNTER — Encounter
Admission: RE | Disposition: A | Discharge status: Home / Self Care | Payer: Self-pay | Source: Ambulatory Visit | Attending: Ophthalmology

## 2016-09-22 ENCOUNTER — Ambulatory Visit
Admission: RE | Admit: 2016-09-22 | Discharge: 2016-09-22 | Disposition: A | Discharge status: Home / Self Care | Payer: Medicare HMO | Source: Ambulatory Visit | Attending: Ophthalmology | Admitting: Ophthalmology

## 2016-09-22 ENCOUNTER — Ambulatory Visit: Payer: Medicare HMO | Admitting: Anesthesiology

## 2016-09-22 DIAGNOSIS — Z923 Personal history of irradiation: Secondary | ICD-10-CM | POA: Diagnosis not present

## 2016-09-22 DIAGNOSIS — Z85819 Personal history of malignant neoplasm of unspecified site of lip, oral cavity, and pharynx: Secondary | ICD-10-CM | POA: Insufficient documentation

## 2016-09-22 DIAGNOSIS — Z79899 Other long term (current) drug therapy: Secondary | ICD-10-CM | POA: Diagnosis not present

## 2016-09-22 DIAGNOSIS — H02834 Dermatochalasis of left upper eyelid: Secondary | ICD-10-CM | POA: Insufficient documentation

## 2016-09-22 DIAGNOSIS — Z87891 Personal history of nicotine dependence: Secondary | ICD-10-CM | POA: Diagnosis not present

## 2016-09-22 DIAGNOSIS — E039 Hypothyroidism, unspecified: Secondary | ICD-10-CM | POA: Diagnosis not present

## 2016-09-22 DIAGNOSIS — H02831 Dermatochalasis of right upper eyelid: Secondary | ICD-10-CM | POA: Insufficient documentation

## 2016-09-22 DIAGNOSIS — H02403 Unspecified ptosis of bilateral eyelids: Secondary | ICD-10-CM | POA: Diagnosis not present

## 2016-09-22 DIAGNOSIS — L574 Cutis laxa senilis: Secondary | ICD-10-CM | POA: Diagnosis not present

## 2016-09-22 DIAGNOSIS — H02401 Unspecified ptosis of right eyelid: Secondary | ICD-10-CM | POA: Diagnosis not present

## 2016-09-22 DIAGNOSIS — H02402 Unspecified ptosis of left eyelid: Secondary | ICD-10-CM | POA: Diagnosis not present

## 2016-09-22 HISTORY — PX: BROW LIFT: SHX178

## 2016-09-22 HISTORY — PX: PTOSIS REPAIR: SHX6568

## 2016-09-22 SURGERY — BLEPHAROPLASTY
Anesthesia: Monitor Anesthesia Care | Site: Eye | Laterality: Bilateral | Wound class: Clean

## 2016-09-22 MED ORDER — ERYTHROMYCIN 5 MG/GM OP OINT: TOPICAL_OINTMENT | OPHTHALMIC | 3 refills | Status: DC

## 2016-09-22 MED ORDER — TETRACAINE HCL 0.5 % OP SOLN
OPHTHALMIC | Status: DC | PRN
  Administered 2016-09-22: 2 [drp] via OPHTHALMIC

## 2016-09-22 MED ORDER — LIDOCAINE HCL (CARDIAC) 20 MG/ML IV SOLN
INTRAVENOUS | Status: DC | PRN
  Administered 2016-09-22: 50 mg via INTRAVENOUS

## 2016-09-22 MED ORDER — FENTANYL CITRATE (PF) 100 MCG/2ML IJ SOLN: 25.0000 ug | INTRAMUSCULAR | Status: DC | PRN

## 2016-09-22 MED ORDER — LACTATED RINGERS IV SOLN
INTRAVENOUS | Status: DC
  Administered 2016-09-22: 08:00:00 via INTRAVENOUS

## 2016-09-22 MED ORDER — MIDAZOLAM HCL 2 MG/2ML IJ SOLN
INTRAMUSCULAR | Status: DC | PRN
  Administered 2016-09-22: 2 mg via INTRAVENOUS

## 2016-09-22 MED ORDER — ONDANSETRON HCL 4 MG/2ML IJ SOLN: 4.0000 mg | Freq: Once | INTRAMUSCULAR | Status: DC | PRN

## 2016-09-22 MED ORDER — ERYTHROMYCIN 5 MG/GM OP OINT
TOPICAL_OINTMENT | OPHTHALMIC | Status: DC | PRN
  Administered 2016-09-22: 1 via OPHTHALMIC

## 2016-09-22 MED ORDER — LIDOCAINE-EPINEPHRINE 2 %-1:100000 IJ SOLN
INTRAMUSCULAR | Status: DC | PRN
  Administered 2016-09-22: 2 mL via OPHTHALMIC
  Administered 2016-09-22: 6 mL via OPHTHALMIC

## 2016-09-22 MED ORDER — OXYCODONE-ACETAMINOPHEN 5-325 MG PO TABS: 1.0000 | ORAL_TABLET | ORAL | 0 refills | Status: DC | PRN

## 2016-09-22 MED ORDER — PROPOFOL 500 MG/50ML IV EMUL
INTRAVENOUS | Status: DC | PRN
  Administered 2016-09-22: 50 ug/kg/min via INTRAVENOUS

## 2016-09-22 MED ORDER — BSS IO SOLN
INTRAOCULAR | Status: DC | PRN
  Administered 2016-09-22: 5 mL

## 2016-09-22 MED ORDER — ALFENTANIL 500 MCG/ML IJ INJ
INJECTION | INTRAVENOUS | Status: DC | PRN
Start: 2016-09-22 — End: 2016-09-22
  Administered 2016-09-22: 700 ug via INTRAVENOUS
  Administered 2016-09-22: 300 ug via INTRAVENOUS

## 2016-09-22 MED ORDER — OXYCODONE HCL 5 MG/5ML PO SOLN: 5.0000 mg | Freq: Once | ORAL | Status: DC | PRN

## 2016-09-22 MED ORDER — OXYCODONE HCL 5 MG PO TABS
5.0000 mg | ORAL_TABLET | Freq: Once | ORAL | Status: DC | PRN
Start: 2016-09-22 — End: 2016-09-22

## 2016-09-22 SURGICAL SUPPLY — 36 items
APPLICATOR COTTON TIP WD 3 STR (MISCELLANEOUS) ×6 IMPLANT
BLADE SURG 15 STRL LF DISP TIS (BLADE) ×1 IMPLANT
BLADE SURG 15 STRL SS (BLADE) ×2
CORD BIP STRL DISP 12FT (MISCELLANEOUS) ×3 IMPLANT
DRAPE HEAD BAR (DRAPES) ×3 IMPLANT
GAUZE SPONGE 4X4 12PLY STRL (GAUZE/BANDAGES/DRESSINGS) ×3 IMPLANT
GAUZE SPONGE NON-WVN 2X2 STRL (MISCELLANEOUS) ×10 IMPLANT
GLOVE SURG LX 7.0 MICRO (GLOVE) ×4
GLOVE SURG LX STRL 7.0 MICRO (GLOVE) ×2 IMPLANT
MARKER SKIN XFINE TIP W/RULER (MISCELLANEOUS) ×3 IMPLANT
NEEDLE FILTER BLUNT 18X 1/2SAF (NEEDLE) ×2
NEEDLE FILTER BLUNT 18X1 1/2 (NEEDLE) ×1 IMPLANT
NEEDLE HYPO 30X.5 LL (NEEDLE) ×6 IMPLANT
PACK DRAPE NASAL/ENT (PACKS) ×3 IMPLANT
SOL PREP PVP 2OZ (MISCELLANEOUS) ×3
SOLUTION PREP PVP 2OZ (MISCELLANEOUS) ×1 IMPLANT
SPONGE VERSALON 2X2 STRL (MISCELLANEOUS) ×20
SUT CHROMIC 4-0 (SUTURE)
SUT CHROMIC 4-0 M2 12X2 ARM (SUTURE)
SUT CHROMIC 5 0 P 3 (SUTURE) ×6 IMPLANT
SUT ETHILON 4 0 CL P 3 (SUTURE) IMPLANT
SUT MERSILENE 4-0 S-2 (SUTURE) IMPLANT
SUT PDS AB 4-0 P3 18 (SUTURE) IMPLANT
SUT PLAIN GUT (SUTURE) ×3 IMPLANT
SUT PROLENE 5 0 P 3 (SUTURE) ×6 IMPLANT
SUT PROLENE 6 0 P 1 18 (SUTURE) ×3 IMPLANT
SUT SILK 4 0 G 3 (SUTURE) IMPLANT
SUT VIC AB 5-0 P-3 18X BRD (SUTURE) IMPLANT
SUT VIC AB 5-0 P3 18 (SUTURE)
SUT VICRYL 6-0  S14 CTD (SUTURE)
SUT VICRYL 6-0 S14 CTD (SUTURE) IMPLANT
SUT VICRYL 7 0 TG140 8 (SUTURE) IMPLANT
SUTURE CHRMC 4-0 M2 12X2 ARM (SUTURE) IMPLANT
SYR 3ML LL SCALE MARK (SYRINGE) ×3 IMPLANT
SYRINGE 10CC LL (SYRINGE) ×3 IMPLANT
WATER STERILE IRR 250ML POUR (IV SOLUTION) ×3 IMPLANT

## 2016-09-22 NOTE — Interval H&P Note (Signed)
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 After consideration of risks, benefits and other options for treatment, the patient has consented to  Procedure(s): BLEPHAROPLASTY (Bilateral) BROW PTOSIS REPAIR (Bilateral) as a surgical intervention .  The patient's history has been reviewed, patient examined, no change in status, stable for surgery.  I have reviewed the patient's chart and labs.  Questions were answered to the patient's satisfaction.     Vickki Muff, Garrus Gauthreaux M

## 2016-09-22 NOTE — Transfer of Care (Signed)
Immediate Anesthesia Transfer of Care Note  Patient: Kevin Glover  Procedure(s) Performed: Procedure(s): BLEPHAROPLASTY (Bilateral) PTOSIS REPAIR (Bilateral)  Patient Location: PACU  Anesthesia Type: MAC  Level of Consciousness: awake, alert  and patient cooperative  Airway and Oxygen Therapy: Patient Spontanous Breathing and Patient connected to supplemental oxygen  Post-op Assessment: Post-op Vital signs reviewed, Patient's Cardiovascular Status Stable, Respiratory Function Stable, Patent Airway and No signs of Nausea or vomiting  Post-op Vital Signs: Reviewed and stable  Complications: No apparent anesthesia complications

## 2016-09-22 NOTE — H&P (Signed)
See the history and physical completed at [facility] on 08/31/16 and scanned into the chart.

## 2016-09-22 NOTE — Anesthesia Preprocedure Evaluation (Signed)
Anesthesia Evaluation  Patient identified by MRN, date of birth, ID band Patient awake    Reviewed: Allergy & Precautions, H&P , NPO status , Patient's Chart, lab work & pertinent test results, reviewed documented beta blocker date and time   Airway Mallampati: II  TM Distance: >3 FB Neck ROM: full    Dental  (+) Upper Dentures, Lower Dentures   Pulmonary neg pulmonary ROS, former smoker,    Pulmonary exam normal breath sounds clear to auscultation       Cardiovascular Exercise Tolerance: Good negative cardio ROS   Rhythm:regular Rate:Normal     Neuro/Psych negative neurological ROS  negative psych ROS   GI/Hepatic negative GI ROS, Neg liver ROS,   Endo/Other  Hypothyroidism   Renal/GU negative Renal ROS  negative genitourinary   Musculoskeletal   Abdominal   Peds  Hematology negative hematology ROS (+)   Anesthesia Other Findings   Reproductive/Obstetrics negative OB ROS                             Anesthesia Physical Anesthesia Plan  ASA: II  Anesthesia Plan: MAC   Post-op Pain Management:    Induction:   PONV Risk Score and Plan:   Airway Management Planned:   Additional Equipment:   Intra-op Plan:   Post-operative Plan:   Informed Consent: I have reviewed the patients History and Physical, chart, labs and discussed the procedure including the risks, benefits and alternatives for the proposed anesthesia with the patient or authorized representative who has indicated his/her understanding and acceptance.   Dental Advisory Given  Plan Discussed with: CRNA  Anesthesia Plan Comments:         Anesthesia Quick Evaluation

## 2016-09-22 NOTE — Op Note (Signed)
Preoperative Diagnosis:   1.  Visually significant bilateral brow ptosis.  2.  Visually significant dermatochalasis bilateral Upper Eyelid(s)  Postoperative Diagnosis: Same.   Procedure(s) Performed:   1. Bilateral Direct brow lift to improve vision.  2.  Upper eyelid blepharoplasty with excess skin excision  both Upper Eyelid(s)  Teaching Surgeon: Philis Pique. Vickki Muff, M.D.   Assistants: None   Anesthesia: MAC  Specimens: None.  Estimated Blood Loss: Minimal.  Complications: None.  Operative Findings: None Dictated  PROCEDURE:   Allergies were reviewed and the patient has No Known Allergies..   After the risks, benefits, complications and alternatives were discussed with the patient, appropriate informed consent was obtained. While seated in an upright position and looking in primary gaze, the amount of supra-brow skin to be removed was measured and marked in an elliptical pattern. The patient was then brought to the operating suite and reclined supine.  Timeout was conducted and the patient was sedated. Local anesthetic consisting of a 50-50 mixture of 2% lidocaine with epinephrine and 0.75% bupivacaine with added Hylenex was injected subcutaneously to the bilateral brow region(s) and down to the periosteum and  subcutaneously to both upper eyelid(s). After adequate local was instilled, the patient was prepped and draped in the usual sterile fashion for eyelid surgery.   Attention was turned to the right brow region. A #15 blade was used to create a bevelled incision along the premarked incision line. A skin and subcutaneous tissue flap was then excised and hemostasis was obtained with bipolar cautery. The deep tissues were reapproximated with interrupted vertical 5-0 chromic sutures. The skin margin was reapproximated with a running locking 5-0 Prolene suture. Attention was then turned to the opposite brow region where the same procedure was performed in the same manner.    Attention  was then turned to the upper eyelids. A 15m upper eyelid crease incision line was marked with calipers on both upper eyelid(s).  A pinch test was used to estimate the amount of excess skin to remove and this was marked in standard blepharoplasty style fashion. Attention was turned to the right upper eyelid. A #15 blade was used to open the premarked incision line. A skin and partial muscle flap was excised and hemostasis was obtained with bipolar cautery.   Attention was then turned to the opposite eyelid where the same procedure was performed in the same manner.   The skin incisions were closed with a combination of interrupted and  running 6-0 fast absorbing plain gut suture.   The patient tolerated the procedure well. Erythromycin ophthalmic ointment was applied to the incision site(s) followed by ice packs.The patient was taken to the recovery area where he recovered without difficulty.  Post-Op Plan/Instructions:  The patient was instructed to use ice packs frequently for the next 48 hours. .he  was instructed to use erythromycin ophthalmic ointment on his incisions 4 times a day for the next 12 to 14 days. he was given a prescription for Percocet for pain control should Tylenol not be effective. he was asked to to follow up in 10 days time for suture removal or sooner as needed for problems.   Amy M. FVickki Muff M.D. Attending,Ophthalmology

## 2016-09-22 NOTE — Anesthesia Procedure Notes (Signed)
Procedure Name: MAC Performed by: Mayme Genta Pre-anesthesia Checklist: Patient identified, Emergency Drugs available, Suction available, Timeout performed and Patient being monitored Patient Re-evaluated:Patient Re-evaluated prior to inductionOxygen Delivery Method: Nasal cannula Placement Confirmation: positive ETCO2

## 2016-09-22 NOTE — Anesthesia Postprocedure Evaluation (Signed)
Anesthesia Post Note  Patient: Kevin Glover  Procedure(s) Performed: Procedure(s) (LRB): BLEPHAROPLASTY (Bilateral) PTOSIS REPAIR (Bilateral)  Patient location during evaluation: PACU Anesthesia Type: MAC Level of consciousness: awake and alert Pain management: pain level controlled Vital Signs Assessment: post-procedure vital signs reviewed and stable Respiratory status: spontaneous breathing, nonlabored ventilation, respiratory function stable and patient connected to nasal cannula oxygen Cardiovascular status: stable and blood pressure returned to baseline Anesthetic complications: no    Alisa Graff

## 2016-09-23 ENCOUNTER — Encounter: Payer: Self-pay | Admitting: Ophthalmology

## 2016-09-30 DIAGNOSIS — Z961 Presence of intraocular lens: Secondary | ICD-10-CM | POA: Diagnosis not present

## 2016-12-14 DIAGNOSIS — C329 Malignant neoplasm of larynx, unspecified: Secondary | ICD-10-CM | POA: Diagnosis not present

## 2016-12-14 DIAGNOSIS — H903 Sensorineural hearing loss, bilateral: Secondary | ICD-10-CM | POA: Diagnosis not present

## 2016-12-18 DIAGNOSIS — Z125 Encounter for screening for malignant neoplasm of prostate: Secondary | ICD-10-CM | POA: Diagnosis not present

## 2016-12-18 DIAGNOSIS — E039 Hypothyroidism, unspecified: Secondary | ICD-10-CM | POA: Diagnosis not present

## 2016-12-18 DIAGNOSIS — E7800 Pure hypercholesterolemia, unspecified: Secondary | ICD-10-CM | POA: Diagnosis not present

## 2016-12-18 DIAGNOSIS — Z0000 Encounter for general adult medical examination without abnormal findings: Secondary | ICD-10-CM | POA: Diagnosis not present

## 2016-12-18 DIAGNOSIS — C320 Malignant neoplasm of glottis: Secondary | ICD-10-CM | POA: Diagnosis not present

## 2016-12-18 DIAGNOSIS — R972 Elevated prostate specific antigen [PSA]: Secondary | ICD-10-CM | POA: Diagnosis not present

## 2016-12-20 IMAGING — CT NM PET TUM IMG INITIAL (PI) SKULL BASE T - THIGH
1 of 9 series · 1 of 25 positions shown · non-contrast
Comparison: Chest CT 06/13/2012

CLINICAL DATA: Initial treatment strategy for cancer of the left
vocal cord.

EXAM:
NUCLEAR MEDICINE PET SKULL BASE TO THIGH
TECHNIQUE: 12.56 mCi F-18 FDG was injected intravenously. Full-ring PET imaging
was performed from the skull base to thigh after the radiotracer. CT
data was obtained and used for attenuation correction and anatomic
localization.
FASTING BLOOD GLUCOSE:  Value: 93 mg/dl

[Series 3: ct wb 5.0 b30f · axial · 5.0mm · 0.98mm/px · 1 of 329 slices shown]
[im 329/329  brain]
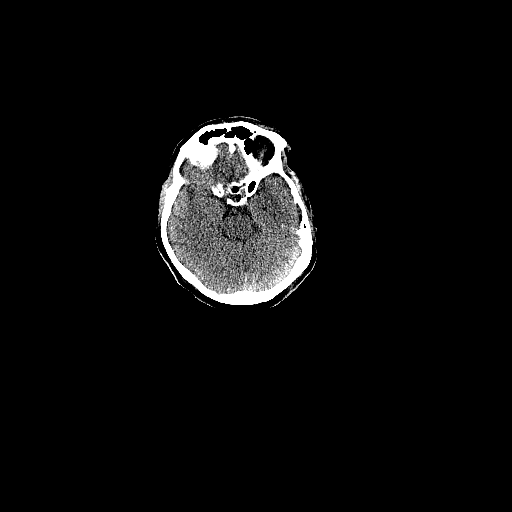

[1 of 25 positions shown; findings below may reference images not displayed]

FINDINGS: NECK

No obvious vocal cord mass or vocal cord hypermetabolism identified.
No hypermetabolic lymph nodes in the neck.

CHEST

No hypermetabolic mediastinal or hilar nodes. No suspicious
pulmonary nodules on the CT scan. Heart size is normal. There is no
significant pericardial fluid, thickening or pericardial
calcification. There is atherosclerosis of the thoracic aorta, the
great vessels of the mediastinum and the coronary arteries,
including calcified atherosclerotic plaque in the left main, left
anterior descending, left circumflex and right coronary arteries.
Mild diffuse bronchial wall thickening. Mild centrilobular and
paraseptal emphysema. Areas of scarring in the lung bases
bilaterally.

ABDOMEN/PELVIS

No abnormal hypermetabolic activity within the liver, pancreas,
adrenal glands, or spleen. No hypermetabolic lymph nodes in the
abdomen or pelvis. Exophytic 1.6 cm low-attenuation lesion in the
medial aspect of the upper pole the right kidney is incompletely
characterized, but demonstrates no hypermetabolism, likely a small
cyst. Atherosclerosis throughout the abdominal and pelvic
vasculature, without definite aneurysm. No significant volume of
ascites. No pneumoperitoneum.

SKELETON

No focal hypermetabolic activity to suggest skeletal metastasis.
IMPRESSION: 1. No definite vocal cord mass or wall or hypermetabolism on today's
examination. No findings to suggest metastatic disease to the neck,
chest, abdomen or pelvis.
2. Atherosclerosis, including left main and 3 vessel coronary artery
disease. Assessment for potential risk factor modification, dietary
therapy or pharmacologic therapy may be warranted, if clinically
indicated.
3. Mild diffuse bronchial thickening with mild centrilobular and
paraseptal emphysema; imaging findings suggestive of underlying
COPD.

## 2016-12-25 DIAGNOSIS — E039 Hypothyroidism, unspecified: Secondary | ICD-10-CM | POA: Diagnosis not present

## 2016-12-25 DIAGNOSIS — Z0000 Encounter for general adult medical examination without abnormal findings: Secondary | ICD-10-CM | POA: Diagnosis not present

## 2016-12-25 DIAGNOSIS — E7800 Pure hypercholesterolemia, unspecified: Secondary | ICD-10-CM | POA: Diagnosis not present

## 2016-12-25 DIAGNOSIS — C320 Malignant neoplasm of glottis: Secondary | ICD-10-CM | POA: Diagnosis not present

## 2016-12-25 DIAGNOSIS — R972 Elevated prostate specific antigen [PSA]: Secondary | ICD-10-CM | POA: Insufficient documentation

## 2017-05-12 ENCOUNTER — Inpatient Hospital Stay
Admission: EM | Admit: 2017-05-12 | Discharge: 2017-05-14 | DRG: 247 | Disposition: A | Discharge status: Home / Self Care | Payer: Medicare HMO | Attending: Internal Medicine | Admitting: Internal Medicine

## 2017-05-12 ENCOUNTER — Other Ambulatory Visit: Payer: Self-pay

## 2017-05-12 ENCOUNTER — Encounter
Admission: EM | Disposition: A | Discharge status: Home / Self Care | Payer: Self-pay | Source: Home / Self Care | Attending: Internal Medicine

## 2017-05-12 DIAGNOSIS — I214 Non-ST elevation (NSTEMI) myocardial infarction: Secondary | ICD-10-CM | POA: Diagnosis not present

## 2017-05-12 DIAGNOSIS — I2111 ST elevation (STEMI) myocardial infarction involving right coronary artery: Secondary | ICD-10-CM

## 2017-05-12 DIAGNOSIS — Z79890 Hormone replacement therapy (postmenopausal): Secondary | ICD-10-CM | POA: Diagnosis not present

## 2017-05-12 DIAGNOSIS — Z961 Presence of intraocular lens: Secondary | ICD-10-CM | POA: Diagnosis present

## 2017-05-12 DIAGNOSIS — I2510 Atherosclerotic heart disease of native coronary artery without angina pectoris: Secondary | ICD-10-CM | POA: Diagnosis present

## 2017-05-12 DIAGNOSIS — Z923 Personal history of irradiation: Secondary | ICD-10-CM | POA: Diagnosis not present

## 2017-05-12 DIAGNOSIS — R079 Chest pain, unspecified: Secondary | ICD-10-CM | POA: Diagnosis not present

## 2017-05-12 DIAGNOSIS — Z9842 Cataract extraction status, left eye: Secondary | ICD-10-CM

## 2017-05-12 DIAGNOSIS — E876 Hypokalemia: Secondary | ICD-10-CM | POA: Diagnosis not present

## 2017-05-12 DIAGNOSIS — I2119 ST elevation (STEMI) myocardial infarction involving other coronary artery of inferior wall: Secondary | ICD-10-CM

## 2017-05-12 DIAGNOSIS — E039 Hypothyroidism, unspecified: Secondary | ICD-10-CM | POA: Diagnosis present

## 2017-05-12 DIAGNOSIS — N179 Acute kidney failure, unspecified: Secondary | ICD-10-CM | POA: Diagnosis present

## 2017-05-12 DIAGNOSIS — Z9841 Cataract extraction status, right eye: Secondary | ICD-10-CM | POA: Diagnosis not present

## 2017-05-12 DIAGNOSIS — E785 Hyperlipidemia, unspecified: Secondary | ICD-10-CM | POA: Diagnosis present

## 2017-05-12 DIAGNOSIS — R072 Precordial pain: Secondary | ICD-10-CM

## 2017-05-12 DIAGNOSIS — Z87891 Personal history of nicotine dependence: Secondary | ICD-10-CM

## 2017-05-12 DIAGNOSIS — Z8521 Personal history of malignant neoplasm of larynx: Secondary | ICD-10-CM

## 2017-05-12 DIAGNOSIS — I213 ST elevation (STEMI) myocardial infarction of unspecified site: Secondary | ICD-10-CM | POA: Diagnosis not present

## 2017-05-12 DIAGNOSIS — Z9861 Coronary angioplasty status: Secondary | ICD-10-CM | POA: Diagnosis not present

## 2017-05-12 HISTORY — PX: CORONARY/GRAFT ACUTE MI REVASCULARIZATION: CATH118305

## 2017-05-12 HISTORY — PX: LEFT HEART CATH AND CORONARY ANGIOGRAPHY: CATH118249

## 2017-05-12 HISTORY — DX: ST elevation (STEMI) myocardial infarction involving other coronary artery of inferior wall: I21.19

## 2017-05-12 LAB — COMPREHENSIVE METABOLIC PANEL
ALBUMIN: 3.9 g/dL (ref 3.5–5.0)
ALK PHOS: 73 U/L (ref 38–126)
ALT: 21 U/L (ref 17–63)
ANION GAP: 9 (ref 5–15)
AST: 30 U/L (ref 15–41)
BUN: 23 mg/dL — ABNORMAL HIGH (ref 6–20)
CHLORIDE: 103 mmol/L (ref 101–111)
CO2: 26 mmol/L (ref 22–32)
Calcium: 9.4 mg/dL (ref 8.9–10.3)
Creatinine, Ser: 1.3 mg/dL — ABNORMAL HIGH (ref 0.61–1.24)
GFR calc Af Amer: 59 mL/min — ABNORMAL LOW (ref >60)
GFR calc non Af Amer: 51 mL/min — ABNORMAL LOW (ref >60)
GLUCOSE: 125 mg/dL — AB (ref 65–99)
POTASSIUM: 3.4 mmol/L — AB (ref 3.5–5.1)
SODIUM: 138 mmol/L (ref 135–145)
Total Bilirubin: 0.7 mg/dL (ref 0.3–1.2)
Total Protein: 7.3 g/dL (ref 6.5–8.1)

## 2017-05-12 LAB — CBC WITH DIFFERENTIAL/PLATELET
Basophils Absolute: 0.1 10*3/uL (ref 0–0.1)
Basophils Relative: 1 %
EOS ABS: 0.2 10*3/uL (ref 0–0.7)
Eosinophils Relative: 2 %
HCT: 42.6 % (ref 40.0–52.0)
HEMOGLOBIN: 14.6 g/dL (ref 13.0–18.0)
LYMPHS ABS: 2.3 10*3/uL (ref 1.0–3.6)
Lymphocytes Relative: 31 %
MCH: 31 pg (ref 26.0–34.0)
MCHC: 34.2 g/dL (ref 32.0–36.0)
MCV: 90.4 fL (ref 80.0–100.0)
Monocytes Absolute: 0.8 10*3/uL (ref 0.2–1.0)
Monocytes Relative: 10 %
NEUTROS PCT: 56 %
Neutro Abs: 4.3 10*3/uL (ref 1.4–6.5)
Platelets: 211 10*3/uL (ref 150–440)
RBC: 4.71 MIL/uL (ref 4.40–5.90)
RDW: 13.7 % (ref 11.5–14.5)
WBC: 7.6 10*3/uL (ref 3.8–10.6)

## 2017-05-12 LAB — PROTIME-INR
INR: 1.02
PROTHROMBIN TIME: 13.3 s (ref 11.4–15.2)

## 2017-05-12 LAB — LIPID PANEL
Cholesterol: 200 mg/dL (ref 0–200)
HDL: 37 mg/dL — ABNORMAL LOW (ref >40)
LDL CALC: 103 mg/dL — AB (ref 0–99)
TRIGLYCERIDES: 302 mg/dL — AB (ref <150)
Total CHOL/HDL Ratio: 5.4 RATIO
VLDL: 60 mg/dL — AB (ref 0–40)

## 2017-05-12 LAB — POCT ACTIVATED CLOTTING TIME: Activated Clotting Time: 351 seconds

## 2017-05-12 LAB — APTT: APTT: 33 s (ref 24–36)

## 2017-05-12 LAB — TROPONIN I: Troponin I: <0.03 ng/mL (ref <0.03)

## 2017-05-12 SURGERY — CORONARY/GRAFT ACUTE MI REVASCULARIZATION
Anesthesia: Moderate Sedation

## 2017-05-12 MED ORDER — HEPARIN (PORCINE) IN NACL 2-0.9 UNIT/ML-% IJ SOLN
INTRAMUSCULAR | Status: AC | PRN
  Administered 2017-05-12: 1000 mL via INTRA_ARTERIAL

## 2017-05-12 MED ORDER — SODIUM CHLORIDE 0.9 % IV SOLN
INTRAVENOUS | Status: DC
  Administered 2017-05-12: 20 mL/h via INTRAVENOUS

## 2017-05-12 MED ORDER — HEPARIN (PORCINE) IN NACL 2-0.9 UNIT/ML-% IJ SOLN
INTRAMUSCULAR | Status: AC
  Filled 2017-05-12: qty 500

## 2017-05-12 MED ORDER — ATORVASTATIN CALCIUM 20 MG PO TABS
80.0000 mg | ORAL_TABLET | Freq: Every day | ORAL | Status: DC
  Administered 2017-05-13: 80 mg via ORAL
  Filled 2017-05-12: qty 4

## 2017-05-12 MED ORDER — HEPARIN SODIUM (PORCINE) 5000 UNIT/ML IJ SOLN
4000.0000 [IU] | Freq: Once | INTRAMUSCULAR | Status: AC
  Administered 2017-05-12: 4000 [IU] via INTRAVENOUS

## 2017-05-12 MED ORDER — NITROGLYCERIN 0.4 MG SL SUBL
0.4000 mg | SUBLINGUAL_TABLET | Freq: Once | SUBLINGUAL | Status: AC
  Administered 2017-05-12: 0.4 mg via SUBLINGUAL

## 2017-05-12 MED ORDER — LIDOCAINE HCL (PF) 1 % IJ SOLN
INTRAMUSCULAR | Status: AC
  Filled 2017-05-12: qty 30

## 2017-05-12 MED ORDER — BIVALIRUDIN BOLUS VIA INFUSION - CUPID
INTRAVENOUS | Status: DC | PRN
  Administered 2017-05-12: 63.3 mg via INTRAVENOUS

## 2017-05-12 MED ORDER — NITROGLYCERIN 1 MG/10 ML FOR IR/CATH LAB
INTRA_ARTERIAL | Status: DC | PRN
  Administered 2017-05-12: 200 ug via INTRACORONARY

## 2017-05-12 MED ORDER — NITROGLYCERIN 5 MG/ML IV SOLN
INTRAVENOUS | Status: AC
  Filled 2017-05-12: qty 10

## 2017-05-12 MED ORDER — BIVALIRUDIN TRIFLUOROACETATE 250 MG IV SOLR
INTRAVENOUS | Status: AC
  Filled 2017-05-12: qty 250

## 2017-05-12 MED ORDER — SODIUM CHLORIDE 0.9 % IV SOLN
INTRAVENOUS | Status: AC | PRN
  Administered 2017-05-12 (×2): 1.75 mg/kg/h via INTRAVENOUS

## 2017-05-12 MED ORDER — TICAGRELOR 90 MG PO TABS
ORAL_TABLET | ORAL | Status: AC
  Filled 2017-05-12: qty 2

## 2017-05-12 MED ORDER — METOPROLOL TARTRATE 25 MG PO TABS
25.0000 mg | ORAL_TABLET | Freq: Two times a day (BID) | ORAL | Status: DC
  Administered 2017-05-13 – 2017-05-14 (×3): 25 mg via ORAL
  Filled 2017-05-12 (×3): qty 1

## 2017-05-12 MED ORDER — IOPAMIDOL (ISOVUE-300) INJECTION 61%
INTRAVENOUS | Status: DC | PRN
  Administered 2017-05-12: 310 mL via INTRA_ARTERIAL

## 2017-05-12 MED ORDER — TICAGRELOR 90 MG PO TABS
ORAL_TABLET | ORAL | Status: DC | PRN
  Administered 2017-05-12: 180 mg via ORAL

## 2017-05-12 SURGICAL SUPPLY — 21 items
BALLN TREK RX 2.5X20 (BALLOONS) ×2
BALLN ~~LOC~~ EUPHORA RX 3.5X15 (BALLOONS) ×2
BALLN ~~LOC~~ TREK RX 3.5X8 (BALLOONS) ×2
BALLOON TREK RX 2.5X20 (BALLOONS) ×1 IMPLANT
BALLOON ~~LOC~~ EUPHORA RX 3.5X15 (BALLOONS) ×1 IMPLANT
BALLOON ~~LOC~~ TREK RX 3.5X8 (BALLOONS) ×1 IMPLANT
CATH INFINITI 5FR ANG PIGTAIL (CATHETERS) ×2 IMPLANT
CATH INFINITI 5FR JL4 (CATHETERS) ×2 IMPLANT
CATH LAUNCHER 6FR JR4 (CATHETERS) ×2 IMPLANT
CATH VISTA GUIDE 6FR JR4 (CATHETERS) ×2 IMPLANT
DEVICE CLOSURE MYNXGRIP 6/7F (Vascular Products) ×2 IMPLANT
DEVICE INFLAT 30 PLUS (MISCELLANEOUS) ×2 IMPLANT
KIT MANI 3VAL PERCEP (MISCELLANEOUS) ×2 IMPLANT
NEEDLE PERC 18GX7CM (NEEDLE) ×2 IMPLANT
PACK CARDIAC CATH (CUSTOM PROCEDURE TRAY) ×2 IMPLANT
SHEATH AVANTI 6FR X 11CM (SHEATH) ×2 IMPLANT
STENT SIERRA 3.25 X 28 MM (Permanent Stent) ×2 IMPLANT
WIRE ASAHI PROWATER 180CM (WIRE) ×2 IMPLANT
WIRE G HI TQ BMW 190 (WIRE) ×2 IMPLANT
WIRE GUIDERIGHT .035X150 (WIRE) ×2 IMPLANT
WIRE RUNTHROUGH .014X180CM (WIRE) ×2 IMPLANT

## 2017-05-12 NOTE — Consult Note (Signed)
[facility]  CARDIOLOGY CONSULT NOTE  Patient ID: Kevin Glover MRN: [id number] DOB/AGE: [birth year] 79 y.o.  Admit date: 05/12/2017 Referring [place] Primary Physician [facility] Primary Cardiologist Reason for Consultation inferior STEMI  HPI: 77 year old gentleman referred for evaluation of inferior STEMI.  The patient was in his usual state of health until this evening when he developed 8 out of 10 substernal chest pain.  EMS was called, initial ECG revealed ST elevations in the inferior leads with reciprocal T wave inversions in the lateral leads.  The patient was brought to [facility] emergency room at which time his chest pain had improved after 1 sublingual nitroglycerin.  Review of systems complete and found to be negative unless listed above     Past Medical History:  Diagnosis Date  . 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 Arthritis    shoulder, knees  . Cough    from throat irritation  . 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 Wears dentures    full upper and lower    Past Surgical History:  Procedure Laterality Date  . BROW LIFT Bilateral 09/22/2016   Procedure: BLEPHAROPLASTY;  Surgeon: Karle Starch, MD;  Location: [facility];  Service: Ophthalmology;  Laterality: Bilateral;  . CATARACT EXTRACTION W/PHACO Right 06/02/2016   Procedure: CATARACT EXTRACTION PHACO AND INTRAOCULAR LENS PLACEMENT ([place]) right;  Surgeon: Eulogio Bear, MD;  Location: [place];  Service: Ophthalmology;  Laterality: Right;  . CATARACT EXTRACTION W/PHACO Left 06/30/2016   Procedure: CATARACT EXTRACTION PHACO AND INTRAOCULAR LENS PLACEMENT ([place])  Left;  Surgeon: Eulogio Bear, MD;  Location: [place];  Service: Ophthalmology;  Laterality: Left;  Marland Kitchen MICROLARYNGOSCOPY     3-4 yrs ago  . MICROLARYNGOSCOPY Left 03/22/2015   Procedure: MICROLARYNGOSCOPY WITH EXCISION V CORD LEFT AND BIOPSY;  Surgeon: Beverly Gust,  MD;  Location: [place];  Service: ENT;  Laterality: Left;  . PTOSIS REPAIR Bilateral 09/22/2016   Procedure: PTOSIS REPAIR;  Surgeon: Karle Starch, MD;  Location: [place];  Service: Ophthalmology;  Laterality: Bilateral;  . VEIN LIGATION Left     Medications Prior to Admission  Medication Sig Dispense Refill Last Dose  . doxycycline (PERIOSTAT) 20 MG tablet Take 1 tablet by mouth 2 (two) times daily.   unknown at unknown  . levothyroxine (SYNTHROID, LEVOTHROID) 25 MCG tablet Take 25 mcg by mouth daily before breakfast.   unknown at unknown   Social History   Socioeconomic History  . Marital status: Married    Spouse name: Not on file  . Number of children: Not on file  . Years of education: Not on file  . Highest education level: Not on file  Social Needs  . Financial resource strain: Not on file  . Food insecurity - worry: Not on file  . Food insecurity - inability: Not on file  . Transportation needs - medical: Not on file  . Transportation needs - non-medical: Not on file  Occupational History  . Not on file  Tobacco Use  . Smoking status: Former Smoker    Types: Cigarettes    Last attempt to quit: 04/20/1988    Years since quitting: 29.0  . Smokeless tobacco: Never Used  . Tobacco comment: quit 1991  Substance and Sexual Activity  . Alcohol use: Yes    Alcohol/week: 10.8 oz    Types: 18 Cans of beer per week  . Drug use:  Not on file  . Sexual activity: Not on file  Other Topics Concern  . Not on file  Social History Narrative  . Not on file    Family History  Problem Relation Age of Onset  . Cancer Mother        Melanoma      Review of systems complete and found to be negative unless listed above      PHYSICAL EXAM  General: Well developed, well nourished, in no acute distress HEENT:  Normocephalic and atramatic Neck:  No JVD.  Lungs: Clear bilaterally to auscultation and percussion. Heart: HRRR . Normal S1 and S2 without gallops  or murmurs.  Abdomen: Bowel sounds are positive, abdomen soft and non-tender  Msk:  Back normal, normal gait. Normal strength and tone for age. Extremities: No clubbing, cyanosis or edema.   Neuro: Alert and oriented X 3. Psych:  Good affect, responds appropriately  Labs:   Lab Results  Component Value Date   WBC 7.6 05/12/2017   HGB 14.6 05/12/2017   HCT 42.6 05/12/2017   MCV 90.4 05/12/2017   PLT 211 05/12/2017    Recent Labs  Lab 05/12/17 2135  NA 138  K 3.4*  CL 103  CO2 26  BUN 23*  CREATININE 1.30*  CALCIUM 9.4  PROT 7.3  BILITOT 0.7  ALKPHOS 73  ALT 21  AST 30  GLUCOSE 125*   Lab Results  Component Value Date   TROPONINI <0.03 05/12/2017    Lab Results  Component Value Date   CHOL 200 05/12/2017   Lab Results  Component Value Date   HDL 37 (L) 05/12/2017   Lab Results  Component Value Date   LDLCALC 103 (H) 05/12/2017   Lab Results  Component Value Date   TRIG 302 (H) 05/12/2017   Lab Results  Component Value Date   CHOLHDL 5.4 05/12/2017   No results found for: LDLDIRECT    Radiology: No results found.  EKG: Normal sinus rhythm, ST elevation in leads II, III and aVF  ASSESSMENT AND PLAN:   1.  New onset substernal chest pain, with ECG revealing ST elevations in leads II, III and aVF, consistent with inferior ST elevation myocardial infarction  Recommendations  1.  Emergent cardiac catheterization, and probable primary percutaneous coronary intervention.  The risks, benefits alternatives of cardiac catheterization and PCI were explained to the patient and informed written consent was obtained.  Signed: Isaias Cowman MD,PhD, [facility] 05/12/2017, 11:37 PM

## 2017-05-12 NOTE — Progress Notes (Signed)
   05/12/17 2200  Clinical Encounter Type  Visited With Patient;Family;Patient and family together  Visit Type Initial;Code;ED;Spiritual support (Code Stemi)  Referral From Nurse  Spiritual Encounters  Spiritual Needs Emotional  CH responded to code stemi; met family at bedside and escorted remaining family to cath lab waiting area. CH offered emotional and spiritual support. 

## 2017-05-12 NOTE — ED Triage Notes (Addendum)
Pt arrives to ED via [facility] from home with c/o non-radiating CP since this afternoon. EMS states pt has no cardiac history. Pt reports (+) nausea and 1 episode of emesis since s/x's started. Pt is A&O, in NAD; RR even, regular, and unlabored. CODE STEMI called en route and Dr Joni Fears at bedside upon pt's arrival. Pt given 324mg  Aspirin and 1 0.4mg  SL Nitro tablet by EMS while en route.

## 2017-05-12 NOTE — ED Provider Notes (Signed)
[facility] Emergency Department Provider Note  ____________________________________________  Time seen: Approximately 9:35 PM  I have reviewed the triage vital signs and the nursing notes.   HISTORY  Chief Complaint Chest Pain and Code STEMI    HPI Kevin Glover is a 77 y.o. male who complains of sudden onset of central chest pain, nonradiating, tightness, occurred at about 8:30 PM tonight. Associated with nausea and vomiting 1 as well as shortness of breath. No diaphoresis. Never had anything like this before. Symptoms were constant. EMS was called and their initial EKG at 9:02 PM showed an inferior STEMI. They gave one nitroglycerin spray which completely relieved his chest painand subsequent EKGs showed improvement of the ischemic changes .    Past Medical History:  Diagnosis Date  . 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 Arthritis    shoulder, knees  . Cough    from throat irritation  . 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 Wears dentures    full upper and lower     Patient Active Problem List   Diagnosis Date Noted  . 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 Hernia, inguinal, left 03/12/2014  . HLD (hyperlipidemia) 03/12/2014  . 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 BROW LIFT Bilateral 09/22/2016   Procedure: BLEPHAROPLASTY;  Surgeon: Karle Starch, MD;  Location: [place];  Service: Ophthalmology;  Laterality: Bilateral;  . CATARACT EXTRACTION W/PHACO Right 06/02/2016   Procedure: CATARACT EXTRACTION PHACO AND INTRAOCULAR LENS PLACEMENT ([place]) right;  Surgeon: Eulogio Bear, MD;  Location: [place];  Service: Ophthalmology;  Laterality: Right;  . CATARACT EXTRACTION W/PHACO Left 06/30/2016   Procedure: CATARACT EXTRACTION PHACO AND INTRAOCULAR LENS PLACEMENT ([place])  Left;  Surgeon: Eulogio Bear, MD;   Location: [place];  Service: Ophthalmology;  Laterality: Left;  Marland Kitchen MICROLARYNGOSCOPY     3-4 yrs ago  . MICROLARYNGOSCOPY Left 03/22/2015   Procedure: MICROLARYNGOSCOPY WITH EXCISION V CORD LEFT AND BIOPSY;  Surgeon: Beverly Gust, MD;  Location: [place];  Service: ENT;  Laterality: Left;  . PTOSIS REPAIR Bilateral 09/22/2016   Procedure: PTOSIS REPAIR;  Surgeon: Karle Starch, MD;  Location: [place];  Service: Ophthalmology;  Laterality: Bilateral;  . VEIN LIGATION Left      Prior to Admission medications   Medication Sig Start Date End Date Taking? Authorizing Provider  doxycycline (ADOXA) 50 MG tablet Take 25 mg by mouth 2 (two) times daily.    [provider]  erythromycin [facility]) ophthalmic ointment Use a small amount on your sutures 4 times a day for the next 2 weeks. Switch to Aquaphor ointment should allergy develop. 09/22/16   Karle Starch, MD  levothyroxine (SYNTHROID, LEVOTHROID) 25 MCG tablet Take 25 mcg by mouth daily before breakfast.    [provider]  oxyCODONE-acetaminophen (PERCOCET) 5-325 MG tablet Take 1 tablet by mouth every 4 (four) hours as needed for severe pain. 09/22/16   Karle Starch, MD     Allergies Patient has no known allergies.   Family History  Problem Relation Age of Onset  . Cancer Mother        Melanoma    Social History Social History   Tobacco Use  . Smoking status: Former Smoker    Types: Cigarettes    Last attempt to quit: 04/20/1988  Years since quitting: 29.0  . Smokeless tobacco: Never Used  . Tobacco comment: quit 1991  Substance Use Topics  . Alcohol use: Yes    Alcohol/week: 10.8 oz    Types: 18 Cans of beer per week  . Drug use: Not on file    Review of Systems  Constitutional:   No fever or chills.  ENT:   No sore throat. No rhinorrhea. Cardiovascular:  Positive as above chest pain without syncope. Respiratory:   No dyspnea or cough. Gastrointestinal:   Negative  for abdominal pain, vomiting and diarrhea.  Musculoskeletal:   Negative for focal pain or swelling All other systems reviewed and are negative except as documented above in ROS and HPI.  ____________________________________________   PHYSICAL EXAM:  VITAL SIGNS: ED Triage Vitals  Enc Vitals Group     BP 05/12/17 2133 106/80     Pulse Rate 05/12/17 2133 70     Resp 05/12/17 2133 18     Temp 05/12/17 2133 98.2 F (36.8 C)     Temp Source 05/12/17 2133 Oral     SpO2 05/12/17 2133 99 %     Weight 05/12/17 2135 186 lb (84.4 kg)     Height 05/12/17 2135 5\' 11"  (1.803 m)     Head Circumference --      Peak Flow --      Pain Score --      Pain Loc --      Pain Edu? --      Excl. in [place]? --     Vital signs reviewed, nursing assessments reviewed.   Constitutional:   Alert and oriented. Not in distress. Eyes:   No scleral icterus.  EOMI. No nystagmus. No conjunctival pallor. PERRL. ENT   Head:   Normocephalic and atraumatic.   Nose:   No congestion/rhinnorhea.    Mouth/Throat:   MMM, no pharyngeal erythema. No peritonsillar mass.    Neck:   No meningismus. Full ROM. Hematological/Lymphatic/Immunilogical:   No cervical lymphadenopathy. Cardiovascular:   RRR. Symmetric bilateral radial and DP pulses.  No murmurs.  Respiratory:   Normal respiratory effort without tachypnea/retractions. Breath sounds are clear and equal bilaterally. No wheezes/rales/rhonchi. Gastrointestinal:   Soft and nontender. Non distended. There is no CVA tenderness.  No rebound, rigidity, or guarding. Genitourinary:   deferred Musculoskeletal:   Normal range of motion in all extremities. No joint effusions.  No lower extremity tenderness.  No edema. Neurologic:   Normal speech and language.  Motor grossly intact. No acute focal neurologic deficits are appreciated.  Skin:    Skin is warm, dry and intact. No rash noted.  No petechiae, purpura, or  bullae.  ____________________________________________    LABS (pertinent positives/negatives) (all labs ordered are listed, but only abnormal results are displayed) Labs Reviewed - No data to display ____________________________________________   EKG  Multiple serial EKGs interpreted by me prehospital EKG 9:02pm shows sinus bradycardia, rate 52. ST elevation in II, III, aVF with associated TWI in I, aVL, and V2.   Subsequently EMS EKGs show improvement in the inferior ST elevation in tube inversions.  ED arrival EKG at 9:31 PM shows sinus rhythm, rate of 69, normal axis and intervals. Normal QRS and ST segments with inferior T-wave inversions. 3 PVCs on the strip.  ____________________________________________    RADIOLOGY  No results found.  ____________________________________________   PROCEDURES .Critical Care Performed by: Carrie Mew, MD Authorized by: Carrie Mew, MD   Critical care provider statement:    Critical care  time (minutes):  30   Critical care time was exclusive of:  Separately billable procedures and treating other patients   Critical care was necessary to treat or prevent imminent or life-threatening deterioration of the following conditions:  Cardiac failure   Critical care was time spent personally by me on the following activities:  Development of treatment plan with patient or surrogate, discussions with consultants, evaluation of patient's response to treatment, examination of patient, obtaining history from patient or surrogate, ordering and performing treatments and interventions, ordering and review of laboratory studies, ordering and review of radiographic studies, pulse oximetry, re-evaluation of patient's condition and review of old charts    ____________________________________________    CLINICAL IMPRESSION / [place] / ED COURSE  Pertinent labs & imaging results that were available during my care of the patient  were reviewed by me and considered in my medical decision making (see chart for details).     Clinical Course as of May 13 2151  Wed May 12, 2017  2134 Seen on arrival. Prehospital EKG shows a inferior STEMI. Ischemic changes resolved after the patient was given nitroglycerin spray by EMS. Currently chest pain-free. We will give IV heparin bolus, and await further evaluation by cardiology.  [PS]  2142 D/w Dr. Saralyn Pilar in ED, to bedside to eval.   [PS]  2149 D/w Paraschos. Plan to proceed to cath for PCI.  [PS]    Clinical Course User Index [PS] Carrie Mew, MD     ----------------------------------------- 9:54 PM on 05/12/2017 -----------------------------------------  Patient off the floor to catheter lab. Case discussed with the hospitalist. ____________________________________________   FINAL CLINICAL IMPRESSION(S) / ED DIAGNOSES    Final diagnoses:  Acute ST elevation myocardial infarction (STEMI) involving right coronary artery [facility])       Portions of this note were generated with dragon dictation software. Dictation errors may occur despite best attempts at proofreading.    Carrie Mew, MD 05/12/17 2154

## 2017-05-13 ENCOUNTER — Other Ambulatory Visit: Payer: Self-pay

## 2017-05-13 ENCOUNTER — Encounter: Payer: Self-pay | Admitting: Cardiology

## 2017-05-13 ENCOUNTER — Inpatient Hospital Stay
Admit: 2017-05-13 | Discharge: 2017-05-13 | Disposition: A | Discharge status: Home / Self Care | Payer: Medicare HMO | Attending: Cardiology | Admitting: Cardiology

## 2017-05-13 DIAGNOSIS — I213 ST elevation (STEMI) myocardial infarction of unspecified site: Secondary | ICD-10-CM | POA: Diagnosis present

## 2017-05-13 LAB — GLUCOSE, CAPILLARY: GLUCOSE-CAPILLARY: 103 mg/dL — AB (ref 65–99)

## 2017-05-13 LAB — BASIC METABOLIC PANEL
Anion gap: 10 (ref 5–15)
BUN: 19 mg/dL (ref 6–20)
CO2: 22 mmol/L (ref 22–32)
Calcium: 9.2 mg/dL (ref 8.9–10.3)
Chloride: 108 mmol/L (ref 101–111)
Creatinine, Ser: 0.9 mg/dL (ref 0.61–1.24)
GFR calc Af Amer: >60 mL/min (ref >60)
GFR calc non Af Amer: >60 mL/min (ref >60)
Glucose, Bld: 103 mg/dL — ABNORMAL HIGH (ref 65–99)
Potassium: 3.6 mmol/L (ref 3.5–5.1)
Sodium: 140 mmol/L (ref 135–145)

## 2017-05-13 LAB — CBC
HCT: 42.7 % (ref 40.0–52.0)
Hemoglobin: 14.4 g/dL (ref 13.0–18.0)
MCH: 30.8 pg (ref 26.0–34.0)
MCHC: 33.7 g/dL (ref 32.0–36.0)
MCV: 91.3 fL (ref 80.0–100.0)
Platelets: 185 10*3/uL (ref 150–440)
RBC: 4.68 MIL/uL (ref 4.40–5.90)
RDW: 14.2 % (ref 11.5–14.5)
WBC: 5.3 10*3/uL (ref 3.8–10.6)

## 2017-05-13 LAB — TROPONIN I
TROPONIN I: 1.19 ng/mL — AB (ref <0.03)
TROPONIN I: 1.12 ng/mL — AB (ref <0.03)
TROPONIN I: 1.01 ng/mL — AB (ref <0.03)
Troponin I: 1.03 ng/mL (ref <0.03)

## 2017-05-13 LAB — ECHOCARDIOGRAM COMPLETE
Height: 71 in
Weight: 3058.22 oz

## 2017-05-13 LAB — HEMOGLOBIN A1C
Hgb A1c MFr Bld: 5.9 % — ABNORMAL HIGH (ref 4.8–5.6)
Mean Plasma Glucose: 122.63 mg/dL

## 2017-05-13 LAB — MRSA PCR SCREENING: MRSA by PCR: NEGATIVE

## 2017-05-13 LAB — TSH: TSH: 4.264 u[IU]/mL (ref 0.350–4.500)

## 2017-05-13 MED ORDER — TICAGRELOR 90 MG PO TABS
90.0000 mg | ORAL_TABLET | Freq: Two times a day (BID) | ORAL | Status: DC
  Administered 2017-05-13 – 2017-05-14 (×3): 90 mg via ORAL
  Filled 2017-05-13 (×5): qty 1

## 2017-05-13 MED ORDER — SODIUM CHLORIDE 0.9 % WEIGHT BASED INFUSION
1.0000 mL/kg/h | INTRAVENOUS | Status: AC
  Administered 2017-05-13: 1 mL/kg/h via INTRAVENOUS

## 2017-05-13 MED ORDER — LEVOTHYROXINE SODIUM 25 MCG PO TABS
25.0000 ug | ORAL_TABLET | Freq: Every day | ORAL | Status: DC
  Administered 2017-05-13 – 2017-05-14 (×2): 25 ug via ORAL
  Filled 2017-05-13 (×2): qty 1

## 2017-05-13 MED ORDER — DOCUSATE SODIUM 100 MG PO CAPS
100.0000 mg | ORAL_CAPSULE | Freq: Two times a day (BID) | ORAL | Status: DC
  Administered 2017-05-13 – 2017-05-14 (×3): 100 mg via ORAL
  Filled 2017-05-13 (×3): qty 1

## 2017-05-13 MED ORDER — SODIUM CHLORIDE 0.9% FLUSH
3.0000 mL | Freq: Two times a day (BID) | INTRAVENOUS | Status: DC
  Administered 2017-05-14: 3 mL via INTRAVENOUS

## 2017-05-13 MED ORDER — SODIUM CHLORIDE 0.9% FLUSH: 3.0000 mL | INTRAVENOUS | Status: DC | PRN

## 2017-05-13 MED ORDER — ASPIRIN 81 MG PO CHEW
81.0000 mg | CHEWABLE_TABLET | Freq: Every day | ORAL | Status: DC
  Administered 2017-05-13 – 2017-05-14 (×2): 81 mg via ORAL
  Filled 2017-05-13 (×2): qty 1

## 2017-05-13 MED ORDER — SODIUM CHLORIDE 0.9 % IV SOLN: 250.0000 mL | INTRAVENOUS | Status: DC | PRN

## 2017-05-13 MED ORDER — MORPHINE SULFATE (PF) 2 MG/ML IV SOLN: 2.0000 mg | INTRAVENOUS | Status: DC | PRN

## 2017-05-13 MED ORDER — ACETAMINOPHEN 325 MG PO TABS: 650.0000 mg | ORAL_TABLET | ORAL | Status: DC | PRN

## 2017-05-13 MED ORDER — ONDANSETRON HCL 4 MG/2ML IJ SOLN: 4.0000 mg | Freq: Four times a day (QID) | INTRAMUSCULAR | Status: DC | PRN

## 2017-05-13 MED ORDER — ACETAMINOPHEN 650 MG RE SUPP: 650.0000 mg | Freq: Four times a day (QID) | RECTAL | Status: DC | PRN

## 2017-05-13 MED ORDER — ACETAMINOPHEN 325 MG PO TABS: 650.0000 mg | ORAL_TABLET | Freq: Four times a day (QID) | ORAL | Status: DC | PRN

## 2017-05-13 MED ORDER — LABETALOL HCL 5 MG/ML IV SOLN: 10.0000 mg | INTRAVENOUS | Status: AC | PRN

## 2017-05-13 MED ORDER — HYDRALAZINE HCL 20 MG/ML IJ SOLN: 5.0000 mg | INTRAMUSCULAR | Status: AC | PRN

## 2017-05-13 NOTE — Progress Notes (Signed)
Patient ID: Kevin Glover, male   DOB: 10-Nov-1940, 77 y.o.   MRN: [id number]  [employer] PROGRESS NOTE  DEO MEHRINGER WUJ:[id number] DOB: [birth year] DOA: 05/12/2017 PCP: Tracie Harrier, MD  HPI/Subjective: Patient feeling fine.  No complaints of chest pain or shortness of breath.  Objective: Vitals:   05/13/17 1305 05/13/17 1414  BP: 113/77 115/85  Pulse: 74 71  Resp: 20 18  Temp:  98 F (36.7 C)  SpO2: 96% 97%    Filed Weights   05/12/17 2135 05/13/17 0000  Weight: 84.4 kg (186 lb) 86.7 kg (191 lb 2.2 oz)    ROS: Review of Systems  Constitutional: Negative for chills and fever.  Eyes: Negative for blurred vision.  Respiratory: Negative for cough and shortness of breath.   Cardiovascular: Negative for chest pain.  Gastrointestinal: Negative for constipation, diarrhea, nausea and vomiting.  Genitourinary: Negative for dysuria.  Musculoskeletal: Negative for joint pain.  Neurological: Negative for dizziness and headaches.   Exam: Physical Exam  HENT:  Nose: No mucosal edema.  Mouth/Throat: No oropharyngeal exudate or posterior oropharyngeal edema.  Eyes: Conjunctivae, EOM and lids are normal. Pupils are equal, round, and reactive to light.  Neck: No JVD present. Carotid bruit is not present. No edema present. No thyroid mass and no thyromegaly present.  Cardiovascular: S1 normal and S2 normal. Exam reveals no gallop.  No murmur heard. Pulses:      Dorsalis pedis pulses are 2+ on the right side, and 2+ on the left side.  Respiratory: No respiratory distress. He has no wheezes. He has no rhonchi. He has no rales.  GI: Soft. Bowel sounds are normal. There is no tenderness.  Musculoskeletal:       Right shoulder: He exhibits no swelling.  Lymphadenopathy:    He has no cervical adenopathy.  Neurological: He is alert. No cranial nerve deficit.  Skin: Skin is warm. No rash noted. Nails show no clubbing.  Psychiatric: He has a normal mood and affect.      Data  Reviewed: Basic Metabolic Panel: Recent Labs  Lab 05/12/17 2135 05/13/17 0451  NA 138 140  K 3.4* 3.6  CL 103 108  CO2 26 22  GLUCOSE 125* 103*  BUN 23* 19  CREATININE 1.30* 0.90  CALCIUM 9.4 9.2   Liver Function Tests: Recent Labs  Lab 05/12/17 2135  AST 30  ALT 21  ALKPHOS 73  BILITOT 0.7  PROT 7.3  ALBUMIN 3.9  CBC: Recent Labs  Lab 05/12/17 2135 05/13/17 0451  WBC 7.6 5.3  NEUTROABS 4.3  --   HGB 14.6 14.4  HCT 42.6 42.7  MCV 90.4 91.3  PLT 211 185   Cardiac Enzymes: Recent Labs  Lab 05/12/17 2135 05/13/17 0451 05/13/17 0841  TROPONINI <0.03 1.03* 1.19*   BNP (last 3 results) No results for input(s): BNP in the last 8760 hours.   CBG: Recent Labs  Lab 05/13/17 0009  GLUCAP 103*    Recent Results (from the past 240 hour(s))  MRSA PCR Screening     Status: None   Collection Time: 05/13/17 12:16 AM  Result Value Ref Range Status   MRSA by PCR NEGATIVE NEGATIVE Final    Comment:        The GeneXpert MRSA Assay (FDA approved for NASAL specimens only), is one component of a comprehensive MRSA colonization surveillance program. It is not intended to diagnose MRSA infection nor to guide or monitor treatment for MRSA infections. Performed at [facility], [phone] [place], [place] [address]      Scheduled Meds: . aspirin  81 mg Oral Daily  . atorvastatin  80 mg Oral q1800  . docusate sodium  100 mg Oral BID  . levothyroxine  25 mcg Oral QAC breakfast  . metoprolol tartrate  25 mg Oral BID  . [START ON 05/14/2017] sodium chloride flush  3 mL Intravenous Q12H  . ticagrelor  90 mg Oral BID   Continuous Infusions: . sodium chloride 20 mL/hr (05/12/17 2145)  . [START ON 05/14/2017] sodium chloride      Assessment/Plan:  1. STEMI with DES placed to the mid RCA.  On aspirin and Brilinta.  Low-dose metoprolol and atorvastatin. 2. Acute kidney injury improved with IV fluid hydration 3. Hyperlipidemia unspecified.  LDL 103  and goal less than 70. 4. Hypothyroidism unspecified on levothyroxine  Code Status:     Code Status Orders  (From admission, onward)        Start     Ordered   05/13/17 0507  Full code  Continuous     05/13/17 0507    Code Status History    Date Active Date Inactive Code Status Order ID Comments User Context   05/13/2017 00:04 05/13/2017 05:07 Full Code [id number]  Isaias Cowman, MD Inpatient     Family Communication: Family at bedside Disposition Plan: Potentially home tomorrow  Consultants:  Cardiology  Time spent: 25 minutes  [place]

## 2017-05-13 NOTE — Progress Notes (Addendum)
Patient presented to the ER c/o of substernal chest pain along with nausea, SOB, and lightheadedness.   STEMI was determined.  Patient immediately underwent Cardiac Cath with DES to mid RCA.  Patient currently on ASA, Brilinta, Metoprolol, and Lipitor.  Patient also found to have AKI with hypokalemia this admission.  Hx of localized cancer of vocal cord, HLD, Hypothyroidism.  Patient lying in bed visiting with family.  Wife and daughter at bedside.      "Heart Attack Bouncing Back" booklet given and reviewed with patient and family. Discussed the definition of CAD. Reviewed the location CAD and where stent was placed. Patient has stent card. Explained the purpose of the stent card. Instructed patient to keep stent card in her wallet.  ? Discussed modifiable risk factors including controlling blood pressure, cholesterol, and blood sugar; following heart healthy diet; maintaining healthy weight; exercise; and smoking cessation, if applicable. ?Note: Patient is a former smoker.  ? Discussed cardiac medications including rationale for taking there medications, mechanisms of action, and side effects. Stressed the importance of taking medications as prescribed.  ? Discussed emergency plan for heart attack symptoms. Patient verbalized understanding of need to call 911 and not to drive herself to ER if having cardiac symptoms / chest pain.  ? Heart healthy diet of low sodium, low fat, low cholesterol heart healthy diet discussed. Information on diet provided. Dietitian Consultation entered.   ? Exercise discussed.  Benefits of exercise discussed.Patient informed this RN that he is very active and walks a lot.  Patient does a lot of walking with his hobby of rabbit hunting.   Explained to patient and family patient had been referred to Cardiac Rehab. Overview of the program provided. Patient informed this RN that he is not interested, as he feels he gets plenty of exercise at home.  Patient's wife and daughter  confirmed this.   Walking was encouraged. Instructed patient to slowly increase and build up to 30 minutes of walking per day for a total of 150 minutes per week per AHA guidelines.   ? Smoking Cessation - Patient quit smoking approximately 29 years ago.  ?  ?Patient and family thanked me for providing the above information.  ? ? Roanna Epley, RN, BSN, [facility] Cardiovascular and Pulmonary Nurse Navigator ?

## 2017-05-13 NOTE — Progress Notes (Signed)
77 yo male admitted to ICU s/p cardiac catheterization due to inferior STEMI found to have 99% stenosis of the mid RCA extending to distal RCA, therefore drug eluting stent placed by Dr. Saralyn Pilar. The pt is currently alert/oriented, follows commands, nsr on cardiac monitor, vss, faint crackle bilateral lung bases all other lobes clear, right groin site wnl no hematoma or bleeding, bilateral lower extremity pulses 2+, and pt denies pain.  PCCM will assist with plan of care while pt is in ICU.  Marda Stalker, [place] Pager [phone] (please enter 7 digits) PCCM Consult Pager [phone] (please enter 7 digits)

## 2017-05-13 NOTE — Progress Notes (Signed)
*  PRELIMINARY RESULTS* Echocardiogram 2D Echocardiogram has been performed.  Kevin Glover 05/13/2017, 4:02 PM

## 2017-05-13 NOTE — Plan of Care (Signed)
  Progressing Clinical Measurements: Cardiovascular complication will be avoided 05/13/2017 2035 - Progressing by Jeri Cos, RN

## 2017-05-13 NOTE — H&P (Signed)
Kevin Glover is an 78 y.o. male.   Chief Complaint: Chest pain HPI: The patient with past medical history of vocal cord cancer and iatrogenic hypothyroidism presents to the emergency department complaining of chest pain.  The patient was at rest on his couch when he began to have substernal chest pain.  It was pressure like in quality.  The patient had 3 episodes of nonbloody nonbilious emesis as well as some shortness of breath and lightheadedness.  EKG showed ST segment elevation and code STEMI was activated.  The patient received a stent in the Cath Lab and was transferred to the ICU for further care to be managed by the hospitalist service conjunction with cardiology.  Past Medical History:  Diagnosis Date  . 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 Arthritis    shoulder, knees  . Cough    from throat irritation  . 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 Wears dentures    full upper and lower    Past Surgical History:  Procedure Laterality Date  . BROW LIFT Bilateral 09/22/2016   Procedure: BLEPHAROPLASTY;  Surgeon: Karle Starch, MD;  Location: [place];  Service: Ophthalmology;  Laterality: Bilateral;  . CATARACT EXTRACTION W/PHACO Right 06/02/2016   Procedure: CATARACT EXTRACTION PHACO AND INTRAOCULAR LENS PLACEMENT ([place]) right;  Surgeon: Eulogio Bear, MD;  Location: [place];  Service: Ophthalmology;  Laterality: Right;  . CATARACT EXTRACTION W/PHACO Left 06/30/2016   Procedure: CATARACT EXTRACTION PHACO AND INTRAOCULAR LENS PLACEMENT ([place])  Left;  Surgeon: Eulogio Bear, MD;  Location: [facility];  Service: Ophthalmology;  Laterality: Left;  Marland Kitchen MICROLARYNGOSCOPY     3-4 yrs ago  . MICROLARYNGOSCOPY Left 03/22/2015   Procedure: MICROLARYNGOSCOPY WITH EXCISION V CORD LEFT AND BIOPSY;  Surgeon: Beverly Gust, MD;  Location: [place];  Service: ENT;  Laterality: Left;  . PTOSIS REPAIR  Bilateral 09/22/2016   Procedure: PTOSIS REPAIR;  Surgeon: Karle Starch, MD;  Location: [place];  Service: Ophthalmology;  Laterality: Bilateral;  . VEIN LIGATION Left     Family History  Problem Relation Age of Onset  . Cancer Mother        Melanoma   Social History:  reports that he quit smoking about 29 years ago. His smoking use included cigarettes. he has never used smokeless tobacco. He reports that he drinks about 10.8 oz of alcohol per week. His drug history is not on file.  Allergies: No Known Allergies  Medications Prior to Admission  Medication Sig Dispense Refill  . doxycycline (PERIOSTAT) 20 MG tablet Take 1 tablet by mouth 2 (two) times daily.    Marland Kitchen levothyroxine (SYNTHROID, LEVOTHROID) 25 MCG tablet Take 25 mcg by mouth daily before breakfast.      Results for orders placed or performed during the hospital encounter of 05/12/17 (from the past 48 hour(s))  CBC with Differential/Platelet     Status: None   Collection Time: 05/12/17  9:35 PM  Result Value Ref Range   WBC 7.6 3.8 - 10.6 K/uL   RBC 4.71 4.40 - 5.90 MIL/uL   Hemoglobin 14.6 13.0 - 18.0 g/dL   HCT 42.6 40.0 - 52.0 %   MCV 90.4 80.0 - 100.0 fL   MCH 31.0 26.0 - 34.0 pg   MCHC 34.2 32.0 - 36.0 g/dL   RDW 13.7 11.5 - 14.5 %   Platelets 211 150 - 440 K/uL  Neutrophils Relative % 56 %   Neutro Abs 4.3 1.4 - 6.5 K/uL   Lymphocytes Relative 31 %   Lymphs Abs 2.3 1.0 - 3.6 K/uL   Monocytes Relative 10 %   Monocytes Absolute 0.8 0.2 - 1.0 K/uL   Eosinophils Relative 2 %   Eosinophils Absolute 0.2 0 - 0.7 K/uL   Basophils Relative 1 %   Basophils Absolute 0.1 0 - 0.1 K/uL    Comment: Performed at [facility], [address] [place], [place] [address]  Protime-INR     Status: None   Collection Time: 05/12/17  9:35 PM  Result Value Ref Range   Prothrombin Time 13.3 11.4 - 15.2 seconds   INR 1.02     Comment: Performed at [facility], [address] [place], [place]  [address]  APTT     Status: None   Collection Time: 05/12/17  9:35 PM  Result Value Ref Range   aPTT 33 24 - 36 seconds    Comment: Performed at [facility], [address] [place], [place] [address]  Comprehensive metabolic panel     Status: Abnormal   Collection Time: 05/12/17  9:35 PM  Result Value Ref Range   Sodium 138 135 - 145 mmol/L   Potassium 3.4 (L) 3.5 - 5.1 mmol/L   Chloride 103 101 - 111 mmol/L   CO2 26 22 - 32 mmol/L   Glucose, Bld 125 (H) 65 - 99 mg/dL   BUN 23 (H) 6 - 20 mg/dL   Creatinine, Ser 1.30 (H) 0.61 - 1.24 mg/dL   Calcium 9.4 8.9 - 10.3 mg/dL   Total Protein 7.3 6.5 - 8.1 g/dL   Albumin 3.9 3.5 - 5.0 g/dL   AST 30 15 - 41 U/L   ALT 21 17 - 63 U/L   Alkaline Phosphatase 73 38 - 126 U/L   Total Bilirubin 0.7 0.3 - 1.2 mg/dL   GFR calc non Af Amer 51 (L) >60 mL/min   GFR calc Af Amer 59 (L) >60 mL/min    Comment: (NOTE) The eGFR has been calculated using the CKD EPI equation. This calculation has not been validated in all clinical situations. eGFR's persistently <60 mL/min signify possible Chronic Kidney Disease.    Anion gap 9 5 - 15    Comment: Performed at [facility], [place], [place] [address]  Troponin I     Status: None   Collection Time: 05/12/17  9:35 PM  Result Value Ref Range   Troponin I <0.03 <0.03 ng/mL    Comment: Performed at [facility], [address] [place], [place] [address]  Lipid panel     Status: Abnormal   Collection Time: 05/12/17  9:35 PM  Result Value Ref Range   Cholesterol 200 0 - 200 mg/dL   Triglycerides 302 (H) <150 mg/dL   HDL 37 (L) >40 mg/dL   Total CHOL/HDL Ratio 5.4 RATIO   VLDL 60 (H) 0 - 40 mg/dL   LDL Cholesterol 103 (H) 0 - 99 mg/dL    Comment:        Total Cholesterol/HDL:CHD Risk Coronary Heart Disease Risk Table                     Men   Women  1/2 Average Risk   3.4   3.3  Average Risk       5.0   4.4  2 X Average Risk   9.6  7.1  3 X Average Risk   23.4   11.0        Use the calculated Patient Ratio above and the CHD Risk Table to determine the patient's CHD Risk.        ATP III CLASSIFICATION (LDL):  <100     mg/dL   Optimal  100-129  mg/dL   Near or Above                    Optimal  130-159  mg/dL   Borderline  160-189  mg/dL   High  >190     mg/dL   Very High Performed at [facility], [address] [place], [place] [address]   POCT Activated clotting time     Status: None   Collection Time: 05/12/17 10:23 PM  Result Value Ref Range   Activated Clotting Time 351 seconds  Glucose, capillary     Status: Abnormal   Collection Time: 05/13/17 12:09 AM  Result Value Ref Range   Glucose-Capillary 103 (H) 65 - 99 mg/dL  MRSA PCR Screening     Status: None   Collection Time: 05/13/17 12:16 AM  Result Value Ref Range   MRSA by PCR NEGATIVE NEGATIVE    Comment:        The GeneXpert MRSA Assay (FDA approved for NASAL specimens only), is one component of a comprehensive MRSA colonization surveillance program. It is not intended to diagnose MRSA infection nor to guide or monitor treatment for MRSA infections. Performed at [facility], [address] [place], [place] [address]    No results found.  Review of Systems  Constitutional: Negative for chills and fever.  HENT: Negative for sore throat and tinnitus.   Eyes: Negative for blurred vision and redness.  Respiratory: Negative for cough and shortness of breath.   Cardiovascular: Positive for chest pain (Resolved). Negative for palpitations, orthopnea and PND.  Gastrointestinal: Negative for abdominal pain, diarrhea, nausea and vomiting.  Genitourinary: Negative for dysuria, frequency and urgency.  Musculoskeletal: Negative for joint pain and myalgias.  Skin: Negative for rash.       No lesions  Neurological: Negative for speech change, focal weakness and weakness.  Endo/Heme/Allergies: Does not bruise/bleed easily.       No temperature  intolerance  Psychiatric/Behavioral: Negative for depression and suicidal ideas.    Blood pressure 116/86, pulse 63, temperature 98.2 F (36.8 C), temperature source Oral, resp. rate 17, height _0  (1.803 m), weight 86.7 kg (191 lb 2.2 oz), SpO2 95 %. Physical Exam  Vitals reviewed. Constitutional: He is oriented to person, place, and time. He appears well-developed and well-nourished. No distress.  HENT:  Head: Normocephalic and atraumatic.  Mouth/Throat: Oropharynx is clear and moist.  Eyes: Conjunctivae and EOM are normal. Pupils are equal, round, and reactive to light. No scleral icterus.  Neck: Normal range of motion. Neck supple. No JVD present. No tracheal deviation present. No thyromegaly present.  Cardiovascular: Normal rate, regular rhythm and normal heart sounds. Exam reveals no gallop and no friction rub.  No murmur heard. Respiratory: Effort normal and breath sounds normal. No respiratory distress.  GI: Soft. Bowel sounds are normal. He exhibits no distension. There is no tenderness.  Genitourinary:  Genitourinary Comments: Deferred  Musculoskeletal: Normal range of motion. He exhibits no edema.  Lymphadenopathy:    He has no cervical adenopathy.  Neurological: He is alert and oriented to person, place, and time. No cranial nerve deficit.  Skin: Skin  is warm and dry. No rash noted. No erythema.  Psychiatric: He has a normal mood and affect. His behavior is normal. Judgment and thought content normal.     Assessment/Plan This is a 77 year old male admitted for STEMI. 1.  STEMI: Drug-eluting stent placed to the mid RCA.  Anticoagulation per cardiology recommendations in the immediate postoperative period.  Monitor heart rate to aim for decrease myocardial oxygen demand. 2.  CAD: Continue aspirin and Brilinta.  Nitroglycerin as needed for chest pain. 3.  Acute kidney injury: Avoid nephrotoxic agents.  Hydrate with intravenous fluid. 4.  Hypokalemia: Replete potassium 5.   Hypothyroidism: Continue Synthroid.  Check TSH  6.  Hyperlipidemia: Continue statin therapy 7.  DVT prophylaxis: Per cardiology recommendations 8.  GI prophylaxis: None Patient is a full code.  Time spent on admission orders and critical care approximately 45 minutes.  Harrie Foreman, MD 05/13/2017, 5:10 AM

## 2017-05-13 NOTE — Progress Notes (Addendum)
Pt to be transferred to room 256 per w/ch.  Report called to [facility]. Pt has been pain free. NSR on monitor with few to occassional PVC's. Lungs clear. No ShOB.Sats well on room air.  Right groin vascular entry site WNL and with occlusive dressing. No hematoma. No bruising. On Brilinta, ASA, and metoprolol.  Education regarding new meds and  groin site care given. He will need MI and medication written info once he arrives to telemetry unit. Pt's wife has been at bedside. Both interested in learning. Troponin 1.03 at 0451 and 1.19 at 0841. Unable to reach [facility] NP for Dr Josefa Half. Repeat troponin scheduled for 1430.

## 2017-05-13 NOTE — Progress Notes (Signed)
Spoke with MD this AM. Reported that HR has been in the 50s, did not restart cardiac medications last pm. B/P has been WNL. Troponin 1.03 this AM. Has been pain free. Slept most of the night.

## 2017-05-13 NOTE — Progress Notes (Signed)
[facility] Cardiology  SUBJECTIVE: Patient laying in bed, eating breakfast, denies chest pain or shortness of breath   Vitals:   05/13/17 0500 05/13/17 0600 05/13/17 0700 05/13/17 0800  BP: 126/80 126/78 (!) 144/87 140/82  Pulse: (!) 56 (!) 56 61 71  Resp: 14 15 14  (!) 21  Temp:      TempSrc:      SpO2: 94% 100% 95% 97%  Weight:      Height:         Intake/Output Summary (Last 24 hours) at 05/13/2017 0830 Last data filed at 05/13/2017 0756 Gross per 24 hour  Intake 620.88 ml  Output 750 ml  Net -129.12 ml      PHYSICAL EXAM  General: Well developed, well nourished, in no acute distress HEENT:  Normocephalic and atramatic Neck:  No JVD.  Lungs: Clear bilaterally to auscultation and percussion. Heart: HRRR . Normal S1 and S2 without gallops or murmurs.  Abdomen: Bowel sounds are positive, abdomen soft and non-tender  Msk:  Back normal, normal gait. Normal strength and tone for age. Extremities: No clubbing, cyanosis or edema.   Neuro: Alert and oriented X 3. Psych:  Good affect, responds appropriately   LABS: Basic Metabolic Panel: Recent Labs    05/12/17 2135 05/13/17 0451  NA 138 140  K 3.4* 3.6  CL 103 108  CO2 26 22  GLUCOSE 125* 103*  BUN 23* 19  CREATININE 1.30* 0.90  CALCIUM 9.4 9.2   Liver Function Tests: Recent Labs    05/12/17 2135  AST 30  ALT 21  ALKPHOS 73  BILITOT 0.7  PROT 7.3  ALBUMIN 3.9   No results for input(s): LIPASE, AMYLASE in the last 72 hours. CBC: Recent Labs    05/12/17 2135 05/13/17 0451  WBC 7.6 5.3  NEUTROABS 4.3  --   HGB 14.6 14.4  HCT 42.6 42.7  MCV 90.4 91.3  PLT 211 185   Cardiac Enzymes: Recent Labs    05/12/17 2135 05/13/17 0451  TROPONINI <0.03 1.03*   BNP: Invalid input(s): POCBNP D-Dimer: No results for input(s): DDIMER in the last 72 hours. Hemoglobin A1C: No results for input(s): HGBA1C in the last 72 hours. Fasting Lipid Panel: Recent Labs    05/12/17 2135  CHOL 200  HDL 37*  LDLCALC  103*  TRIG 302*  CHOLHDL 5.4   Thyroid Function Tests: Recent Labs    05/13/17 0451  TSH 4.264   Anemia Panel: No results for input(s): VITAMINB12, FOLATE, FERRITIN, TIBC, IRON, RETICCTPCT in the last 72 hours.  No results found.   Echo pending  TELEMETRY: Normal sinus rhythm:  ASSESSMENT AND PLAN:  Active Problems:   STEMI involving oth coronary artery of inferior wall (HCC)   STEMI (ST elevation myocardial infarction) ([place])    1.  Inferior STEMI, status post DES mid to distal RCA, no recurrent chest pain 2.  Hypothyroidism, on Synthroid  Recommendations  1.  Continue dual antiplatelet therapy uninterrupted for 1 year 2.  Continue high-dose atorvastatin for hyperlipidemia management 3.  Review 2D echocardiogram 4.  May transfer to telemetry   Kevin Cowman, MD, PhD, [facility] 05/13/2017 8:30 AM

## 2017-05-14 MED ORDER — METOPROLOL TARTRATE 25 MG PO TABS: 25.0000 mg | ORAL_TABLET | Freq: Two times a day (BID) | ORAL | 0 refills | Status: DC

## 2017-05-14 MED ORDER — ATORVASTATIN CALCIUM 80 MG PO TABS: 80.0000 mg | ORAL_TABLET | Freq: Every day | ORAL | 0 refills | Status: DC

## 2017-05-14 MED ORDER — ASPIRIN 81 MG PO CHEW: 81.0000 mg | CHEWABLE_TABLET | Freq: Every day | ORAL | Status: AC

## 2017-05-14 MED ORDER — TICAGRELOR 90 MG PO TABS: 90.0000 mg | ORAL_TABLET | Freq: Two times a day (BID) | ORAL | 0 refills | Status: DC

## 2017-05-14 MED ORDER — ACETAMINOPHEN 325 MG PO TABS: 650.0000 mg | ORAL_TABLET | Freq: Four times a day (QID) | ORAL | Status: DC | PRN

## 2017-05-14 MED ORDER — DOCUSATE SODIUM 100 MG PO CAPS: 100.0000 mg | ORAL_CAPSULE | Freq: Two times a day (BID) | ORAL | 0 refills | Status: DC | PRN

## 2017-05-14 NOTE — Progress Notes (Signed)
[facility] Cardiology  SUBJECTIVE: Patient laying in bed comfortably, denies chest pain or shortness of breath   Vitals:   05/13/17 1414 05/13/17 1937 05/14/17 0334 05/14/17 0750  BP: 115/85 113/73 112/78 111/76  Pulse: 71 74 72 74  Resp: 18 16 17 14   Temp: 98 F (36.7 C) 97.8 F (36.6 C) (!) 97.5 F (36.4 C) (!) 97.5 F (36.4 C)  TempSrc: Oral Oral Oral Oral  SpO2: 97% 96% 93% 98%  Weight:   85.2 kg (187 lb 12.8 oz)   Height:         Intake/Output Summary (Last 24 hours) at 05/14/2017 0802 Last data filed at 05/13/2017 1300 Gross per 24 hour  Intake 360 ml  Output 450 ml  Net -90 ml      PHYSICAL EXAM  General: Well developed, well nourished, in no acute distress HEENT:  Normocephalic and atramatic Neck:  No JVD.  Lungs: Clear bilaterally to auscultation and percussion. Heart: HRRR . Normal S1 and S2 without gallops or murmurs.  Abdomen: Bowel sounds are positive, abdomen soft and non-tender  Msk:  Back normal, normal gait. Normal strength and tone for age. Extremities: No clubbing, cyanosis or edema.   Neuro: Alert and oriented X 3. Psych:  Good affect, responds appropriately   LABS: Basic Metabolic Panel: Recent Labs    05/12/17 2135 05/13/17 0451  NA 138 140  K 3.4* 3.6  CL 103 108  CO2 26 22  GLUCOSE 125* 103*  BUN 23* 19  CREATININE 1.30* 0.90  CALCIUM 9.4 9.2   Liver Function Tests: Recent Labs    05/12/17 2135  AST 30  ALT 21  ALKPHOS 73  BILITOT 0.7  PROT 7.3  ALBUMIN 3.9   No results for input(s): LIPASE, AMYLASE in the last 72 hours. CBC: Recent Labs    05/12/17 2135 05/13/17 0451  WBC 7.6 5.3  NEUTROABS 4.3  --   HGB 14.6 14.4  HCT 42.6 42.7  MCV 90.4 91.3  PLT 211 185   Cardiac Enzymes: Recent Labs    05/13/17 0841 05/13/17 1521 05/13/17 1843  TROPONINI 1.19* 1.12* 1.01*   BNP: Invalid input(s): POCBNP D-Dimer: No results for input(s): DDIMER in the last 72 hours. Hemoglobin A1C: Recent Labs    05/13/17 0451   HGBA1C 5.9*   Fasting Lipid Panel: Recent Labs    05/12/17 2135  CHOL 200  HDL 37*  LDLCALC 103*  TRIG 302*  CHOLHDL 5.4   Thyroid Function Tests: Recent Labs    05/13/17 0451  TSH 4.264   Anemia Panel: No results for input(s): VITAMINB12, FOLATE, FERRITIN, TIBC, IRON, RETICCTPCT in the last 72 hours.  No results found.   Echo LVEF 60-65%  TELEMETRY: Normal sinus rhythm:  ASSESSMENT AND PLAN:  Active Problems:   STEMI involving oth coronary artery of inferior wall (HCC)   STEMI (ST elevation myocardial infarction) ([place])    1.  Acute inferior STEMI, status post DES mid to distal RCA, no recurrent chest pain, peak troponin I 0.19 normal left ventricular function,  Recommendations  1.  Continue dual antiplatelet therapy uninterrupted for 1 year 2.  Ambulate  3.  May discharge home, follow-up in 1 week   Isaias Cowman, MD, PhD, [facility] 05/14/2017 8:02 AM

## 2017-05-14 NOTE — Progress Notes (Signed)
Pt up for d/c per MD order.Patient ambulated around nurses station and tolerated well, SPO2 remained 95%-98% on R/A, no distress noted.

## 2017-05-14 NOTE — Discharge Summary (Signed)
[facility] [place] NAME: Kevin Glover    MR#:  [id number]  DATE OF BIRTH:  1941-03-31  DATE OF ADMISSION:  05/12/2017 ADMITTING PHYSICIAN: Harrie Foreman, MD  DATE OF DISCHARGE: 05/14/2017  PRIMARY CARE PHYSICIAN: Tracie Harrier, MD    ADMISSION DIAGNOSIS:  Precordial pain [R07.2] Acute ST elevation myocardial infarction (STEMI) involving right coronary artery (HCC) [I21.11] STEMI involving oth coronary artery of inferior wall ([place]) [I21.19]  DISCHARGE DIAGNOSIS:  Active Problems:   STEMI involving oth coronary artery of inferior wall (HCC)   STEMI (ST elevation myocardial infarction) ([place])   SECONDARY DIAGNOSIS:   Past Medical History:  Diagnosis Date  . 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 Arthritis    shoulder, knees  . Cough    from throat irritation  . 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 Wears dentures    full upper and lower    HOSPITAL COURSE:  HPI: The patient with past medical history of vocal cord cancer and iatrogenic hypothyroidism presents to the emergency department complaining of chest pain.  The patient was at rest on his couch when he began to have substernal chest pain.  It was pressure like in quality.  The patient had 3 episodes of nonbloody nonbilious emesis as well as some shortness of breath and lightheadedness.  EKG showed ST segment elevation and code STEMI was activated.  The patient received a stent in the Cath Lab and was transferred to the ICU for further care to be managed by the hospitalist service conjunction with cardiology.    1. STEMI with DES placed to the mid RCA.  Clinically doing fine.  Ambulated in the hallway without any discomfort.  Continue on aspirin and Brilinta.  Low-dose metoprolol and atorvastatin.  Okay to discharge patient from cardiology standpoint  2. Acute kidney injury improved with IV fluid  hydration 3. Hyperlipidemia unspecified.  LDL 103 and goal less than 70. 4. Hypothyroidism unspecified on levothyroxine     DISCHARGE CONDITIONS:   Stable  CONSULTS OBTAINED:  Treatment Team:  Isaias Cowman, MD   PROCEDURES cardiac catheterization and drug-eluting stent on mid RCA 05/13/17  DRUG ALLERGIES:  No Known Allergies  DISCHARGE MEDICATIONS:   Allergies as of 05/14/2017   No Known Allergies     Medication List    TAKE these medications   acetaminophen 325 MG tablet Commonly known as:  TYLENOL Take 2 tablets (650 mg total) by mouth every 6 (six) hours as needed for mild pain or headache (or Fever >/= 101).   aspirin 81 MG chewable tablet Chew 1 tablet (81 mg total) by mouth daily. Start taking on:  05/15/2017   atorvastatin 80 MG tablet Commonly known as:  LIPITOR Take 1 tablet (80 mg total) by mouth daily at 6 PM.   docusate sodium 100 MG capsule Commonly known as:  COLACE Take 1 capsule (100 mg total) by mouth 2 (two) times daily as needed for mild constipation.   doxycycline 20 MG tablet Commonly known as:  PERIOSTAT Take 1 tablet by mouth 2 (two) times daily.   levothyroxine 25 MCG tablet Commonly known as:  SYNTHROID, LEVOTHROID Take 25 mcg by mouth daily before breakfast.   metoprolol tartrate 25 MG tablet Commonly known as:  LOPRESSOR Take 1 tablet (25 mg total) by mouth 2 (two) times daily.   ticagrelor 90 MG Tabs tablet Commonly known as:  [employer]  Take 1 tablet (90 mg total) by mouth 2 (two) times daily.        DISCHARGE INSTRUCTIONS:   Follow-up with primary care physician in 5-7 days Follow-up with cardiology Dr. Saralyn Pilar in a week Follow-up with cardiac rehabilitation center in 3-4 days  DIET:  Cardiac diet  DISCHARGE CONDITION:  Stable  ACTIVITY:  Activity as tolerated  OXYGEN:  Home Oxygen: No.   Oxygen Delivery: room air  DISCHARGE LOCATION:  home   If you experience worsening of your admission  symptoms, develop shortness of breath, life threatening emergency, suicidal or homicidal thoughts you must seek medical attention immediately by calling 911 or calling your MD immediately  if symptoms less severe.  You Must read complete instructions/literature along with all the possible adverse reactions/side effects for all the Medicines you take and that have been prescribed to you. Take any new Medicines after you have completely understood and accpet all the possible adverse reactions/side effects.   Please note  You were cared for by a hospitalist during your hospital stay. If you have any questions about your discharge medications or the care you received while you were in the hospital after you are discharged, you can call the unit and asked to speak with the hospitalist on call if the hospitalist that took care of you is not available. Once you are discharged, your primary care physician will handle any further medical issues. Please note that NO REFILLS for any discharge medications will be authorized once you are discharged, as it is imperative that you return to your primary care physician (or establish a relationship with a primary care physician if you do not have one) for your aftercare needs so that they can reassess your need for medications and monitor your lab values.     Today  Chief Complaint  Patient presents with  . Chest Pain  . Code STEMI   Patient denies any chest pain or shortness of breath.  Feeling much better.  Ambulated in the hallway without any discomfort, vital signs are stable  ROS:  CONSTITUTIONAL: Denies fevers, chills. Denies any fatigue, weakness.  EYES: Denies blurry vision, double vision, eye pain. EARS, NOSE, THROAT: Denies tinnitus, ear pain, hearing loss. RESPIRATORY: Denies cough, wheeze, shortness of breath.  CARDIOVASCULAR: Denies chest pain, palpitations, edema.  GASTROINTESTINAL: Denies nausea, vomiting, diarrhea, abdominal pain. Denies  bright red blood per rectum. GENITOURINARY: Denies dysuria, hematuria. ENDOCRINE: Denies nocturia or thyroid problems. HEMATOLOGIC AND LYMPHATIC: Denies easy bruising or bleeding. SKIN: Denies rash or lesion. MUSCULOSKELETAL: Denies pain in neck, back, shoulder, knees, hips or arthritic symptoms.  NEUROLOGIC: Denies paralysis, paresthesias.  PSYCHIATRIC: Denies anxiety or depressive symptoms.   VITAL SIGNS:  Blood pressure 111/76, pulse 74, temperature (!) 97.5 F (36.4 C), temperature source Oral, resp. rate 14, height 5\' 11"  (1.803 m), weight 85.2 kg (187 lb 12.8 oz), SpO2 98 %.  I/O:    Intake/Output Summary (Last 24 hours) at 05/14/2017 0907 Last data filed at 05/13/2017 1300 Gross per 24 hour  Intake 360 ml  Output 450 ml  Net -90 ml    PHYSICAL EXAMINATION:  GENERAL:  77 y.o.-year-old patient lying in the bed with no acute distress.  EYES: Pupils equal, round, reactive to light and accommodation. No scleral icterus. Extraocular muscles intact.  HEENT: Head atraumatic, normocephalic. Oropharynx and nasopharynx clear.  NECK:  Supple, no jugular venous distention. No thyroid enlargement, no tenderness.  LUNGS: Normal breath sounds bilaterally, no wheezing, rales,rhonchi or crepitation. No use of  accessory muscles of respiration.  CARDIOVASCULAR: S1, S2 normal. No murmurs, rubs, or gallops.  ABDOMEN: Soft, non-tender, non-distended. Bowel sounds present. No organomegaly or mass.  EXTREMITIES: Right groin area, clean post cardiac catheter dressing  no pedal edema, cyanosis, or clubbing.  NEUROLOGIC: Cranial nerves II through XII are intact. Muscle strength 5/5 in all extremities. Sensation intact. Gait not checked.  PSYCHIATRIC: The patient is alert and oriented x 3.  SKIN: No obvious rash, lesion, or ulcer.   DATA REVIEW:   CBC Recent Labs  Lab 05/13/17 0451  WBC 5.3  HGB 14.4  HCT 42.7  PLT 185    Chemistries  Recent Labs  Lab 05/12/17 2135 05/13/17 0451  NA  138 140  K 3.4* 3.6  CL 103 108  CO2 26 22  GLUCOSE 125* 103*  BUN 23* 19  CREATININE 1.30* 0.90  CALCIUM 9.4 9.2  AST 30  --   ALT 21  --   ALKPHOS 73  --   BILITOT 0.7  --     Cardiac Enzymes Recent Labs  Lab 05/13/17 1843  TROPONINI 1.01*    Microbiology Results  Results for orders placed or performed during the hospital encounter of 05/12/17  MRSA PCR Screening     Status: None   Collection Time: 05/13/17 12:16 AM  Result Value Ref Range Status   MRSA by PCR NEGATIVE NEGATIVE Final    Comment:        The GeneXpert MRSA Assay (FDA approved for NASAL specimens only), is one component of a comprehensive MRSA colonization surveillance program. It is not intended to diagnose MRSA infection nor to guide or monitor treatment for MRSA infections. Performed at [facility], [address] [place], [place] [address]     RADIOLOGY:  No results found.  EKG:   Orders placed or performed during the hospital encounter of 05/12/17  . EKG 12-Lead  . EKG 12-Lead  . EKG 12-Lead immediately post procedure  . EKG 12-Lead  . EKG 12-Lead immediately post procedure  . EKG 12-Lead  . EKG 12-Lead  . EKG 12-Lead  . EKG 12-Lead  . EKG 12-Lead      Management plans discussed with the patient, he is  in agreement.  CODE STATUS:     Code Status Orders  (From admission, onward)        Start     Ordered   05/13/17 0507  Full code  Continuous     05/13/17 0507    Code Status History    Date Active Date Inactive Code Status Order ID Comments User Context   05/13/2017 00:04 05/13/2017 05:07 Full Code [id number]  Isaias Cowman, MD Inpatient      TOTAL TIME TAKING CARE OF THIS PATIENT: 41 minutes.   Note: This dictation was prepared with Dragon dictation along with smaller phrase technology. Any transcriptional errors that result from this process are unintentional.   @[facility] @  on 05/14/2017 at 9:07 AM  Between 7am to 6pm - Pager -  [phone]  After 6pm go to [email or web address] - password [email or web address] [facility]  Office  [phone]  CC: Primary care physician; Tracie Harrier, MD

## 2017-05-14 NOTE — Progress Notes (Signed)
Kevin Glover to be D/C'd Home per MD order.  Discussed prescriptions and follow up appointments with the patient. Prescriptions given to patient, medication list explained in detail. Pt verbalized understanding.  Allergies as of 05/14/2017   No Known Allergies     Medication List    TAKE these medications   acetaminophen 325 MG tablet Commonly known as:  TYLENOL Take 2 tablets (650 mg total) by mouth every 6 (six) hours as needed for mild pain or headache (or Fever >/= 101).   aspirin 81 MG chewable tablet Chew 1 tablet (81 mg total) by mouth daily. Start taking on:  05/15/2017   atorvastatin 80 MG tablet Commonly known as:  LIPITOR Take 1 tablet (80 mg total) by mouth daily at 6 PM.   docusate sodium 100 MG capsule Commonly known as:  COLACE Take 1 capsule (100 mg total) by mouth 2 (two) times daily as needed for mild constipation.   doxycycline 20 MG tablet Commonly known as:  PERIOSTAT Take 1 tablet by mouth 2 (two) times daily.   levothyroxine 25 MCG tablet Commonly known as:  SYNTHROID, LEVOTHROID Take 25 mcg by mouth daily before breakfast.   metoprolol tartrate 25 MG tablet Commonly known as:  LOPRESSOR Take 1 tablet (25 mg total) by mouth 2 (two) times daily.   ticagrelor 90 MG Tabs tablet Commonly known as:  BRILINTA Take 1 tablet (90 mg total) by mouth 2 (two) times daily.       Vitals:   05/14/17 0334 05/14/17 0750  BP: 112/78 111/76  Pulse: 72 74  Resp: 17 14  Temp: (!) 97.5 F (36.4 C) (!) 97.5 F (36.4 C)  SpO2: 93% 98%    Tele box removed and returned.skin clean, dry and intact without evidence of skin break down, no evidence of skin tears noted. IV catheter discontinued intact. Site without signs and symptoms of complications. Dressing and pressure applied. Pt denies pain at this time. No complaints noted.  An After Visit Summary was printed and given to the patient. Patient escorted via [place], and D/C home via private auto.  Rolley Sims

## 2017-05-14 NOTE — Discharge Instructions (Signed)
Follow-up with primary care physician in 5-7 days Follow-up with cardiology Dr. Saralyn Pilar in a week Follow-up with cardiac rehabilitation center in 3-4 days

## 2017-05-18 ENCOUNTER — Other Ambulatory Visit: Payer: Self-pay

## 2017-05-18 NOTE — Patient Outreach (Signed)
[facility]) Care Management  05/18/2017  Kevin Glover 01/21/1941 [id number]     Transition of Care Referral  Referral Date: 05/18/17 Referral Source: [employer] Discharge Report Date of Admission: 05/12/17 Diagnosis: STEMI Date of Discharge: 05/14/17 Facility: [facility] Medicare    Outreach attempt # 1 to patient at [phone] disconnected. RN CM attempted to reach pt at alternate number. No answer and unable to leave voicemail message.     Plan: RN CM will make outreach attempt to patient within one business day if no return call.    Enzo Montgomery, RN,BSN,CCM [place] Management Telephonic Care Management Coordinator Direct Phone: [phone] Toll Free: [phone] Fax: [phone]

## 2017-05-19 ENCOUNTER — Other Ambulatory Visit: Payer: Self-pay

## 2017-05-19 DIAGNOSIS — I8392 Asymptomatic varicose veins of left lower extremity: Secondary | ICD-10-CM | POA: Diagnosis not present

## 2017-05-19 DIAGNOSIS — E039 Hypothyroidism, unspecified: Secondary | ICD-10-CM | POA: Diagnosis not present

## 2017-05-19 DIAGNOSIS — C320 Malignant neoplasm of glottis: Secondary | ICD-10-CM | POA: Diagnosis not present

## 2017-05-19 DIAGNOSIS — Z09 Encounter for follow-up examination after completed treatment for conditions other than malignant neoplasm: Secondary | ICD-10-CM | POA: Diagnosis not present

## 2017-05-19 DIAGNOSIS — I2510 Atherosclerotic heart disease of native coronary artery without angina pectoris: Secondary | ICD-10-CM | POA: Insufficient documentation

## 2017-05-19 DIAGNOSIS — E7800 Pure hypercholesterolemia, unspecified: Secondary | ICD-10-CM | POA: Diagnosis not present

## 2017-05-19 NOTE — Patient Outreach (Signed)
[facility]) Care Management  05/19/2017  Kevin Glover 02/10/1941 [id number]     Transition of Care Referral  Referral Date: 05/18/17 Referral Source: [employer] Discharge Report Date of Admission: 05/12/17 Diagnosis: STEMI Date of Discharge: 05/14/17 Facility: [facility]   Outreach attempt #2 to patient. Spoke with patient who voices he is doing fine. He has PCP f/u appt later today. He goes to see cardiologist on Friday. He voices spouse will be taking him to appts and spouse able to assist with any needs. He voices that he has all his meds and denies any issues or concerns regarding meds at this time. Patient is already aware and plans to take list of medications with him to MD appts for MD review. He voices he knows when and how to contact MD for any changes in condition. Patient denies any [facility] and/or RN CM needs or concerns at this time. He was appreciative of f/u call.       Plan: RN CM will notify [facility] administrative assistant of case status.   Enzo Montgomery, RN,BSN,CCM [facility] Management Telephonic Care Management Coordinator Direct Phone: [phone] Toll Free: [phone] Fax: [phone]

## 2017-05-21 DIAGNOSIS — E7800 Pure hypercholesterolemia, unspecified: Secondary | ICD-10-CM | POA: Diagnosis not present

## 2017-05-21 DIAGNOSIS — I2510 Atherosclerotic heart disease of native coronary artery without angina pectoris: Secondary | ICD-10-CM | POA: Diagnosis not present

## 2017-05-21 DIAGNOSIS — I2111 ST elevation (STEMI) myocardial infarction involving right coronary artery: Secondary | ICD-10-CM | POA: Diagnosis not present

## 2017-06-08 ENCOUNTER — Encounter (INDEPENDENT_AMBULATORY_CARE_PROVIDER_SITE_OTHER): Payer: Self-pay | Admitting: Vascular Surgery

## 2017-06-08 ENCOUNTER — Ambulatory Visit (INDEPENDENT_AMBULATORY_CARE_PROVIDER_SITE_OTHER): Payer: Medicare HMO | Admitting: Vascular Surgery

## 2017-06-08 VITALS — BP 166/96 | HR 66 | Resp 17 | Ht 71.0 in | Wt 184.0 lb

## 2017-06-08 DIAGNOSIS — R600 Localized edema: Secondary | ICD-10-CM

## 2017-06-08 DIAGNOSIS — E785 Hyperlipidemia, unspecified: Secondary | ICD-10-CM

## 2017-06-08 DIAGNOSIS — I83813 Varicose veins of bilateral lower extremities with pain: Secondary | ICD-10-CM | POA: Diagnosis not present

## 2017-06-08 NOTE — Progress Notes (Signed)
Subjective:    Patient ID: Kevin Glover, male    DOB: Mar 29, 1941, 77 y.o.   MRN: [id number] Chief Complaint  Patient presents with  . New Patient (Initial Visit)    ref [facility] Varicose Veins   Presents as a new patient referred by Dr. Roque Lias for evaluation of bilateral lower extremity varicose veins.  The patient endorses a long-standing history of painful varicosities.  The patient underwent what sounds like vein stripping to the left lower extremity by Dr. Tamala Julian "years ago".  Over the last few months the patient has noted the varicose veins in his left thigh have grown in size and discomfort.  The patient experiences the discomfort towards the end of the day or with sitting and standing for long periods of time the patient notes the symptoms to the left lower extremity are worse when compared to the right.  The patient also experiences some mild swelling to the bilateral lower extremity.  This also worsens with sitting and standing for long periods of time the patient's discomfort has progressed to the point he is unable to function on a daily basis.  He feels that his symptoms are lifestyle limiting.  At this point, the patient does not engage in conservative therapy including wearing medical grade 1 compression socks and elevating his legs heart level or higher on a daily basis.  The patient denies any surgery or trauma to the bilateral lower extremity with the exception of what sounds like vein stripping to the left lower extremity.  He denies any DVT history to the bilateral lower extremity.  The patient denies any fever, nausea vomiting.   Review of Systems  Constitutional: Negative.   HENT: Negative.   Eyes: Negative.   Respiratory: Negative.   Cardiovascular: Positive for leg swelling.       Painful varicose veins  Gastrointestinal: Negative.   Endocrine: Negative.   Genitourinary: Negative.   Musculoskeletal: Negative.   Skin: Negative.   Allergic/Immunologic: Negative.     Neurological: Negative.   Hematological: Negative.   Psychiatric/Behavioral: Negative.       Objective:   Physical Exam  Constitutional: He is oriented to person, place, and time. He appears well-developed and well-nourished. No distress.  HENT:  Head: Normocephalic and atraumatic.  Eyes: Conjunctivae are normal. Pupils are equal, round, and reactive to light.  Neck: Normal range of motion.  Cardiovascular: Normal rate, regular rhythm, normal heart sounds and intact distal pulses.  Pulses:      Radial pulses are 2+ on the right side, and 2+ on the left side.       Dorsalis pedis pulses are 2+ on the right side, and 2+ on the left side.       Posterior tibial pulses are 2+ on the right side, and 2+ on the left side.  Pulmonary/Chest: Breath sounds normal.  Musculoskeletal: Normal range of motion. He exhibits edema (Mild nonpitting bilateral lower extremity noted).  Neurological: He is oriented to person, place, and time.  Skin: He is not diaphoretic.  Left thigh: Greater than 1 cm scattered varicose veins otherwise less than 1 cm scattered varicosities noted to the bilateral lower extremity.  There is mild stasis dermatitis noted to the bilateral calves.  There is no skin changes.  There is no cellulitis.  Skin is intact.  Psychiatric: He has a normal mood and affect. His behavior is normal. Judgment and thought content normal.  Vitals reviewed.  BP (!) 166/96 (BP Location: Right Arm)   Pulse  66   Resp 17   Ht 5\' 11"  (1.803 m)   Wt 184 lb (83.5 kg)   BMI 25.66 kg/m   Past Medical History:  Diagnosis Date  . 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 Arthritis    shoulder, knees  . Cough    from throat irritation  . 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 Myocardial infarction ([place])   . Wears dentures    full upper and lower   Social History   Socioeconomic History  . Marital status: Married    Spouse name: Not on file  .  Number of children: Not on file  . Years of education: Not on file  . Highest education level: Not on file  Social Needs  . Financial resource strain: Not on file  . Food insecurity - worry: Not on file  . Food insecurity - inability: Not on file  . Transportation needs - medical: Not on file  . Transportation needs - non-medical: Not on file  Occupational History  . Not on file  Tobacco Use  . Smoking status: Former Smoker    Types: Cigarettes    Last attempt to quit: 04/20/1988    Years since quitting: 29.1  . Smokeless tobacco: Never Used  . Tobacco comment: quit 1991  Substance and Sexual Activity  . Alcohol use: Yes    Alcohol/week: 10.8 oz    Types: 18 Cans of beer per week  . Drug use: No  . Sexual activity: Not on file  Other Topics Concern  . Not on file  Social History Narrative  . Not on file   Past Surgical History:  Procedure Laterality Date  . BROW LIFT Bilateral 09/22/2016   Procedure: BLEPHAROPLASTY;  Surgeon: Karle Starch, MD;  Location: [facility];  Service: Ophthalmology;  Laterality: Bilateral;  . CATARACT EXTRACTION W/PHACO Right 06/02/2016   Procedure: CATARACT EXTRACTION PHACO AND INTRAOCULAR LENS PLACEMENT ([place]) right;  Surgeon: Eulogio Bear, MD;  Location: [facility];  Service: Ophthalmology;  Laterality: Right;  . CATARACT EXTRACTION W/PHACO Left 06/30/2016   Procedure: CATARACT EXTRACTION PHACO AND INTRAOCULAR LENS PLACEMENT ([place])  Left;  Surgeon: Eulogio Bear, MD;  Location: [facility];  Service: Ophthalmology;  Laterality: Left;  . CORONARY/GRAFT ACUTE MI REVASCULARIZATION N/A 05/12/2017   Procedure: Coronary/Graft Acute MI Revascularization;  Surgeon: Isaias Cowman, MD;  Location: [facility];  Service: Cardiovascular;  Laterality: N/A;  . LEFT HEART CATH AND CORONARY ANGIOGRAPHY N/A 05/12/2017   Procedure: LEFT HEART CATH AND CORONARY ANGIOGRAPHY;  Surgeon: Isaias Cowman, MD;  Location: [facility];  Service: Cardiovascular;  Laterality: N/A;  . MICROLARYNGOSCOPY     3-4 yrs ago  . MICROLARYNGOSCOPY Left 03/22/2015   Procedure: MICROLARYNGOSCOPY WITH EXCISION V CORD LEFT AND BIOPSY;  Surgeon: Beverly Gust, MD;  Location: [facility];  Service: ENT;  Laterality: Left;  . PTOSIS REPAIR Bilateral 09/22/2016   Procedure: PTOSIS REPAIR;  Surgeon: Karle Starch, MD;  Location: [facility];  Service: Ophthalmology;  Laterality: Bilateral;  . VEIN LIGATION Left    Family History  Problem Relation Age of Onset  . Cancer Mother        Melanoma   No Known Allergies     Assessment & Plan:  Presents as a new patient referred by Dr. Roque Lias for evaluation of bilateral lower extremity varicose veins.  The patient endorses a long-standing  history of painful varicosities.  The patient underwent what sounds like vein stripping to the left lower extremity by Dr. Tamala Julian "years ago".  Over the last few months the patient has noted the varicose veins in his left thigh have grown in size and discomfort.  The patient experiences the discomfort towards the end of the day or with sitting and standing for long periods of time the patient notes the symptoms to the left lower extremity are worse when compared to the right.  The patient also experiences some mild swelling to the bilateral lower extremity.  This also worsens with sitting and standing for long periods of time the patient's discomfort has progressed to the point he is unable to function on a daily basis.  He feels that his symptoms are lifestyle limiting.  At this point, the patient does not engage in conservative therapy including wearing medical grade 1 compression socks and elevating his legs heart level or higher on a daily basis.  The patient denies any surgery or trauma to the bilateral lower extremity with the exception of what sounds like vein stripping to the left lower extremity.  He denies any DVT history to the  bilateral lower extremity.  The patient denies any fever, nausea vomiting.  1. Varicose veins of both lower extremities with pain - New The patient was encouraged to wear graduated compression stockings (20-30 mmHg) on a daily basis. The patient was instructed to begin wearing the stockings first thing in the morning and removing them in the evening. The patient was instructed specifically not to sleep in the stockings. Prescription given In addition, behavioral modification including elevation during the day will be initiated. Anti-inflammatories for pain. I will bring the patient back in 3 months and undergo a bilateral lower extremity venous duplex to rule out any contributing venous disease The patient will follow up in three months to asses conservative management.  Information on chronic venous insufficiency and compression stockings was given to the patient. The patient was instructed to call the office in the interim if any worsening edema or ulcerations to the legs, feet or toes occurs. The patient expresses their understanding.  - VAS [place] LOWER EXTREMITY VENOUS REFLUX; Future  2. Bilateral lower extremity edema - New As above  3. Hyperlipidemia, unspecified hyperlipidemia type - Stable Encouraged good control as its slows the progression of atherosclerotic disease  Current Outpatient Medications on File Prior to Visit  Medication Sig Dispense Refill  . aspirin 81 MG chewable tablet Chew 1 tablet (81 mg total) by mouth daily.    Marland Kitchen atorvastatin (LIPITOR) 80 MG tablet Take 1 tablet (80 mg total) by mouth daily at 6 PM. 30 tablet 0  . doxycycline (PERIOSTAT) 20 MG tablet Take 1 tablet by mouth 2 (two) times daily.    Marland Kitchen levothyroxine (SYNTHROID, LEVOTHROID) 25 MCG tablet Take 25 mcg by mouth daily before breakfast.    . metoprolol tartrate (LOPRESSOR) 25 MG tablet Take 1 tablet (25 mg total) by mouth 2 (two) times daily. 60 tablet 0  . ticagrelor (BRILINTA) 90 MG TABS tablet Take 1  tablet (90 mg total) by mouth 2 (two) times daily. 60 tablet 0  . acetaminophen (TYLENOL) 325 MG tablet Take 2 tablets (650 mg total) by mouth every 6 (six) hours as needed for mild pain or headache (or Fever >/= 101). (Patient not taking: Reported on 06/08/2017)    . docusate sodium (COLACE) 100 MG capsule Take 1 capsule (100 mg total) by mouth 2 (two)  times daily as needed for mild constipation. (Patient not taking: Reported on 06/08/2017) 10 capsule 0   No current facility-administered medications on file prior to visit.    There are no Patient Instructions on file for this visit. No Follow-up on file.  Zahirah Cheslock A Jorel Gravlin, PA-C

## 2017-06-17 DIAGNOSIS — C329 Malignant neoplasm of larynx, unspecified: Secondary | ICD-10-CM | POA: Diagnosis not present

## 2017-06-17 DIAGNOSIS — H903 Sensorineural hearing loss, bilateral: Secondary | ICD-10-CM | POA: Diagnosis not present

## 2017-06-22 DIAGNOSIS — G8929 Other chronic pain: Secondary | ICD-10-CM | POA: Diagnosis not present

## 2017-06-22 DIAGNOSIS — M25561 Pain in right knee: Secondary | ICD-10-CM | POA: Diagnosis not present

## 2017-06-30 DIAGNOSIS — M25561 Pain in right knee: Secondary | ICD-10-CM | POA: Diagnosis not present

## 2017-06-30 DIAGNOSIS — I2510 Atherosclerotic heart disease of native coronary artery without angina pectoris: Secondary | ICD-10-CM | POA: Diagnosis not present

## 2017-07-16 DIAGNOSIS — S83231A Complex tear of medial meniscus, current injury, right knee, initial encounter: Secondary | ICD-10-CM | POA: Diagnosis not present

## 2017-07-21 ENCOUNTER — Other Ambulatory Visit: Payer: Self-pay | Admitting: Orthopedic Surgery

## 2017-07-21 DIAGNOSIS — S83231A Complex tear of medial meniscus, current injury, right knee, initial encounter: Secondary | ICD-10-CM

## 2017-07-29 ENCOUNTER — Ambulatory Visit
Admission: RE | Admit: 2017-07-29 | Discharge: 2017-07-29 | Disposition: A | Discharge status: Home / Self Care | Payer: Medicare HMO | Source: Ambulatory Visit | Attending: Orthopedic Surgery | Admitting: Orthopedic Surgery

## 2017-07-29 DIAGNOSIS — S83231A Complex tear of medial meniscus, current injury, right knee, initial encounter: Secondary | ICD-10-CM | POA: Diagnosis not present

## 2017-07-29 DIAGNOSIS — R609 Edema, unspecified: Secondary | ICD-10-CM | POA: Insufficient documentation

## 2017-07-29 DIAGNOSIS — S83281A Other tear of lateral meniscus, current injury, right knee, initial encounter: Secondary | ICD-10-CM | POA: Diagnosis not present

## 2017-07-29 DIAGNOSIS — M25461 Effusion, right knee: Secondary | ICD-10-CM | POA: Insufficient documentation

## 2017-07-29 DIAGNOSIS — X58XXXA Exposure to other specified factors, initial encounter: Secondary | ICD-10-CM | POA: Diagnosis not present

## 2017-07-29 DIAGNOSIS — M25561 Pain in right knee: Secondary | ICD-10-CM | POA: Diagnosis not present

## 2017-08-11 DIAGNOSIS — R972 Elevated prostate specific antigen [PSA]: Secondary | ICD-10-CM | POA: Diagnosis not present

## 2017-08-11 DIAGNOSIS — C320 Malignant neoplasm of glottis: Secondary | ICD-10-CM | POA: Diagnosis not present

## 2017-08-11 DIAGNOSIS — E039 Hypothyroidism, unspecified: Secondary | ICD-10-CM | POA: Diagnosis not present

## 2017-08-11 DIAGNOSIS — E7800 Pure hypercholesterolemia, unspecified: Secondary | ICD-10-CM | POA: Diagnosis not present

## 2017-08-11 DIAGNOSIS — Z0000 Encounter for general adult medical examination without abnormal findings: Secondary | ICD-10-CM | POA: Diagnosis not present

## 2017-08-18 DIAGNOSIS — E039 Hypothyroidism, unspecified: Secondary | ICD-10-CM | POA: Diagnosis not present

## 2017-08-18 DIAGNOSIS — Z01818 Encounter for other preprocedural examination: Secondary | ICD-10-CM | POA: Diagnosis not present

## 2017-08-18 DIAGNOSIS — R972 Elevated prostate specific antigen [PSA]: Secondary | ICD-10-CM | POA: Diagnosis not present

## 2017-08-18 DIAGNOSIS — Z0000 Encounter for general adult medical examination without abnormal findings: Secondary | ICD-10-CM | POA: Diagnosis not present

## 2017-08-18 DIAGNOSIS — I2510 Atherosclerotic heart disease of native coronary artery without angina pectoris: Secondary | ICD-10-CM | POA: Diagnosis not present

## 2017-08-20 DIAGNOSIS — E7800 Pure hypercholesterolemia, unspecified: Secondary | ICD-10-CM | POA: Diagnosis not present

## 2017-08-20 DIAGNOSIS — I2111 ST elevation (STEMI) myocardial infarction involving right coronary artery: Secondary | ICD-10-CM | POA: Diagnosis not present

## 2017-08-20 DIAGNOSIS — I2510 Atherosclerotic heart disease of native coronary artery without angina pectoris: Secondary | ICD-10-CM | POA: Diagnosis not present

## 2017-08-24 ENCOUNTER — Other Ambulatory Visit: Payer: Self-pay

## 2017-08-24 ENCOUNTER — Encounter
Admission: RE | Admit: 2017-08-24 | Discharge: 2017-08-24 | Disposition: A | Discharge status: Home / Self Care | Payer: Medicare HMO | Source: Ambulatory Visit | Attending: Orthopedic Surgery | Admitting: Orthopedic Surgery

## 2017-08-24 DIAGNOSIS — X58XXXA Exposure to other specified factors, initial encounter: Secondary | ICD-10-CM | POA: Insufficient documentation

## 2017-08-24 DIAGNOSIS — Z01818 Encounter for other preprocedural examination: Secondary | ICD-10-CM | POA: Insufficient documentation

## 2017-08-24 DIAGNOSIS — S83231A Complex tear of medial meniscus, current injury, right knee, initial encounter: Secondary | ICD-10-CM | POA: Insufficient documentation

## 2017-08-24 HISTORY — DX: Atherosclerotic heart disease of native coronary artery without angina pectoris: I25.10

## 2017-08-24 NOTE — Anesthesia Pain Management Evaluation Note (Addendum)
MI/STENT 1/19 . DR PARASCHOS NOTE 08/20/17 INDICATES MUST STAY ON BRILINTA AND ASPIRIN. DR [facility] AWARE AND WANTS GENERAL ANESTHESIA. DISCUSSED WITH DR Angus. 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

## 2017-08-24 NOTE — Pre-Procedure Instructions (Signed)
DR PARASCOS NOTE 08/20/17 STATES MUST STAY ON BRILINTA AND ASPIRIN. SPOKE WITH TIFFANY AT DR [facility]. HE IS AWARE AND PLANS GEN ANESTHESIA. SEE ANES NOTE

## 2017-08-24 NOTE — Patient Instructions (Signed)
Your procedure is scheduled on: 09/02/17 Report to Day Surgery.[facility] SECOND FLOOR To find out your arrival time please call [phone] between 1PM - 3PM on 09/01/17.  Remember: Instructions that are not followed completely may result in serious medical risk, up to and including death, or upon the discretion of your surgeon and anesthesiologist your surgery may need to be rescheduled.     _X__ 1. Do not eat food after midnight the night before your procedure.                 No gum chewing or hard candies. You may drink clear liquids up to 2 hours                 before you are scheduled to arrive for your surgery- DO not drink clear                 liquids within 2 hours of the start of your surgery.                 Clear Liquids include:  water, apple juice without pulp, clear carbohydrate                 drink such as Clearfast of Gartorade, Black Coffee or Tea (Do not add                 anything to coffee or tea).  __X__2.  On the morning of surgery brush your teeth with toothpaste and water, you                 may rinse your mouth with mouthwash if you wish.  Do not swallow any              toothpaste of mouthwash.     _X__ 3.  No Alcohol for 24 hours before or after surgery.   _X__ 4.  Do Not Smoke or use e-cigarettes For 24 Hours Prior to Your Surgery.                 Do not use any chewable tobacco products for at least 6 hours prior to                 surgery.  ____  5.  Bring all medications with you on the day of surgery if instructed.   X____  6.  Notify your doctor if there is any change in your medical condition      (cold, fever, infections).     Do not wear jewelry, make-up, hairpins, clips or nail polish. Do not wear lotions, powders, or perfumes. You may wear deodorant. Do not shave 48 hours prior to surgery. Men may shave face and neck. Do not bring valuables to the hospital.    [facility] is not responsible for any belongings or  valuables.  Contacts, dentures or bridgework may not be worn into surgery. Leave your suitcase in the car. After surgery it may be brought to your room. For patients admitted to the hospital, discharge time is determined by your treatment team.   Patients discharged the day of surgery will not be allowed to drive home.   Please read over the following fact sheets that you were given:   Surgical Site Infection Prevention   __X__ Take these medicines the morning of surgery with A SIP OF WATER:    1. LEVOTHYROXINE  2. METOPROLOL  3.   4.  5.  6.  ____ Fleet Enema (  as directed)   __X__ Use CHG Soap as directed  ____ Use inhalers on the day of surgery  ____ Stop metformin 2 days prior to surgery    ____ Take 1/2 of usual insulin dose the night before surgery. No insulin the morning          of surgery.   _X___ Stop Coumadin/Plavix/aspirin on     TO CONTINUE ASPIRIN AND BRILINTA  BY DR PARACHOS NOTE 08/20/17 AND DR MENZ AWARE. PLANNING GENERAL ANESTHESIA  ____ Stop Anti-inflammatories on  ____ Stop supplements until after surgery.    ____ Bring C-Pap to the hospital.

## 2017-09-02 ENCOUNTER — Other Ambulatory Visit: Payer: Self-pay

## 2017-09-02 ENCOUNTER — Encounter
Admission: RE | Disposition: A | Discharge status: Home / Self Care | Payer: Self-pay | Source: Ambulatory Visit | Attending: Orthopedic Surgery

## 2017-09-02 ENCOUNTER — Ambulatory Visit
Admission: RE | Admit: 2017-09-02 | Discharge: 2017-09-02 | Disposition: A | Discharge status: Home / Self Care | Payer: Medicare HMO | Source: Ambulatory Visit | Attending: Orthopedic Surgery | Admitting: Orthopedic Surgery

## 2017-09-02 ENCOUNTER — Encounter: Payer: Self-pay | Admitting: *Deleted

## 2017-09-02 ENCOUNTER — Ambulatory Visit: Payer: Medicare HMO | Admitting: Certified Registered Nurse Anesthetist

## 2017-09-02 DIAGNOSIS — M67361 Transient synovitis, right knee: Secondary | ICD-10-CM | POA: Diagnosis not present

## 2017-09-02 DIAGNOSIS — Z79899 Other long term (current) drug therapy: Secondary | ICD-10-CM | POA: Insufficient documentation

## 2017-09-02 DIAGNOSIS — S83271A Complex tear of lateral meniscus, current injury, right knee, initial encounter: Secondary | ICD-10-CM | POA: Diagnosis not present

## 2017-09-02 DIAGNOSIS — S83289A Other tear of lateral meniscus, current injury, unspecified knee, initial encounter: Secondary | ICD-10-CM | POA: Diagnosis not present

## 2017-09-02 DIAGNOSIS — Y929 Unspecified place or not applicable: Secondary | ICD-10-CM | POA: Diagnosis not present

## 2017-09-02 DIAGNOSIS — M6751 Plica syndrome, right knee: Secondary | ICD-10-CM | POA: Diagnosis not present

## 2017-09-02 DIAGNOSIS — E039 Hypothyroidism, unspecified: Secondary | ICD-10-CM | POA: Insufficient documentation

## 2017-09-02 DIAGNOSIS — I252 Old myocardial infarction: Secondary | ICD-10-CM | POA: Insufficient documentation

## 2017-09-02 DIAGNOSIS — I2510 Atherosclerotic heart disease of native coronary artery without angina pectoris: Secondary | ICD-10-CM | POA: Diagnosis not present

## 2017-09-02 DIAGNOSIS — X58XXXA Exposure to other specified factors, initial encounter: Secondary | ICD-10-CM | POA: Diagnosis not present

## 2017-09-02 DIAGNOSIS — S83231A Complex tear of medial meniscus, current injury, right knee, initial encounter: Secondary | ICD-10-CM | POA: Diagnosis not present

## 2017-09-02 DIAGNOSIS — Z87891 Personal history of nicotine dependence: Secondary | ICD-10-CM | POA: Insufficient documentation

## 2017-09-02 HISTORY — PX: KNEE ARTHROSCOPY WITH MEDIAL MENISECTOMY: SHX5651

## 2017-09-02 SURGERY — ARTHROSCOPY, KNEE, WITH MEDIAL MENISCECTOMY
Anesthesia: General | Site: Knee | Laterality: Right | Wound class: "Clean "

## 2017-09-02 MED ORDER — ONDANSETRON HCL 4 MG/2ML IJ SOLN
INTRAMUSCULAR | Status: AC
  Filled 2017-09-02: qty 2

## 2017-09-02 MED ORDER — DEXAMETHASONE SODIUM PHOSPHATE 10 MG/ML IJ SOLN
INTRAMUSCULAR | Status: AC
  Filled 2017-09-02: qty 1

## 2017-09-02 MED ORDER — BUPIVACAINE-EPINEPHRINE (PF) 0.5% -1:200000 IJ SOLN
INTRAMUSCULAR | Status: AC
  Filled 2017-09-02: qty 30

## 2017-09-02 MED ORDER — LIDOCAINE HCL (CARDIAC) PF 100 MG/5ML IV SOSY
PREFILLED_SYRINGE | INTRAVENOUS | Status: DC | PRN
Start: 2017-09-02 — End: 2017-09-02
  Administered 2017-09-02: 60 mg via INTRAVENOUS

## 2017-09-02 MED ORDER — FENTANYL CITRATE (PF) 100 MCG/2ML IJ SOLN
INTRAMUSCULAR | Status: DC | PRN
  Administered 2017-09-02 (×4): 25 ug via INTRAVENOUS

## 2017-09-02 MED ORDER — BUPIVACAINE-EPINEPHRINE (PF) 0.5% -1:200000 IJ SOLN
INTRAMUSCULAR | Status: DC | PRN
  Administered 2017-09-02: 20 mL

## 2017-09-02 MED ORDER — FENTANYL CITRATE (PF) 100 MCG/2ML IJ SOLN: 25.0000 ug | INTRAMUSCULAR | Status: DC | PRN

## 2017-09-02 MED ORDER — PROPOFOL 10 MG/ML IV BOLUS
INTRAVENOUS | Status: DC | PRN
  Administered 2017-09-02: 180 mg via INTRAVENOUS

## 2017-09-02 MED ORDER — LACTATED RINGERS IV SOLN
INTRAVENOUS | Status: DC
  Administered 2017-09-02: 09:00:00 via INTRAVENOUS

## 2017-09-02 MED ORDER — ONDANSETRON HCL 4 MG/2ML IJ SOLN: 4.0000 mg | Freq: Once | INTRAMUSCULAR | Status: DC | PRN

## 2017-09-02 MED ORDER — FAMOTIDINE 20 MG PO TABS
20.0000 mg | ORAL_TABLET | Freq: Once | ORAL | Status: AC
  Administered 2017-09-02: 20 mg via ORAL

## 2017-09-02 MED ORDER — DEXAMETHASONE SODIUM PHOSPHATE 10 MG/ML IJ SOLN
INTRAMUSCULAR | Status: DC | PRN
  Administered 2017-09-02: 5 mg via INTRAVENOUS

## 2017-09-02 MED ORDER — ONDANSETRON HCL 4 MG/2ML IJ SOLN
INTRAMUSCULAR | Status: DC | PRN
  Administered 2017-09-02: 4 mg via INTRAVENOUS

## 2017-09-02 MED ORDER — LIDOCAINE HCL (PF) 2 % IJ SOLN
INTRAMUSCULAR | Status: AC
  Filled 2017-09-02: qty 10

## 2017-09-02 MED ORDER — HYDROCODONE-ACETAMINOPHEN 5-325 MG PO TABS: 1.0000 | ORAL_TABLET | Freq: Four times a day (QID) | ORAL | 0 refills | Status: DC | PRN

## 2017-09-02 MED ORDER — PROPOFOL 10 MG/ML IV BOLUS
INTRAVENOUS | Status: AC
  Filled 2017-09-02: qty 20

## 2017-09-02 MED ORDER — FENTANYL CITRATE (PF) 100 MCG/2ML IJ SOLN
INTRAMUSCULAR | Status: AC
  Filled 2017-09-02: qty 2

## 2017-09-02 MED ORDER — FAMOTIDINE 20 MG PO TABS
ORAL_TABLET | ORAL | Status: AC
  Administered 2017-09-02: 20 mg via ORAL
  Filled 2017-09-02: qty 1

## 2017-09-02 SURGICAL SUPPLY — 25 items
BANDAGE ACE 4X5 VEL STRL LF (GAUZE/BANDAGES/DRESSINGS) IMPLANT
BLADE INCISOR PLUS 4.5 (BLADE) IMPLANT
CHLORAPREP W/TINT 26ML (MISCELLANEOUS) ×3 IMPLANT
CUFF TOURN 24 STER (MISCELLANEOUS) IMPLANT
CUFF TOURN 30 STER DUAL PORT (MISCELLANEOUS) IMPLANT
GAUZE SPONGE 4X4 12PLY STRL (GAUZE/BANDAGES/DRESSINGS) ×3 IMPLANT
GLOVE SURG SYN 9.0  PF PI (GLOVE) ×2
GLOVE SURG SYN 9.0 PF PI (GLOVE) ×1 IMPLANT
GOWN SRG 2XL LVL 4 RGLN SLV (GOWNS) ×1 IMPLANT
GOWN STRL NON-REIN 2XL LVL4 (GOWNS) ×2
GOWN STRL REUS W/ TWL LRG LVL3 (GOWN DISPOSABLE) ×2 IMPLANT
GOWN STRL REUS W/TWL LRG LVL3 (GOWN DISPOSABLE) ×4
IV LACTATED RINGER IRRG 3000ML (IV SOLUTION) ×4
IV LR IRRIG 3000ML ARTHROMATIC (IV SOLUTION) ×2 IMPLANT
KIT TURNOVER KIT A (KITS) ×3 IMPLANT
MANIFOLD NEPTUNE II (INSTRUMENTS) ×3 IMPLANT
PACK ARTHROSCOPY KNEE (MISCELLANEOUS) ×3 IMPLANT
SCALPEL PROTECTED #11 DISP (BLADE) ×3 IMPLANT
SET TUBE SUCT SHAVER OUTFL 24K (TUBING) ×3 IMPLANT
SET TUBE TIP INTRA-ARTICULAR (MISCELLANEOUS) ×3 IMPLANT
SUT ETHILON 4-0 (SUTURE) ×2
SUT ETHILON 4-0 FS2 18XMFL BLK (SUTURE) ×1
SUTURE ETHLN 4-0 FS2 18XMF BLK (SUTURE) ×1 IMPLANT
TUBING ARTHRO INFLOW-ONLY STRL (TUBING) ×3 IMPLANT
WAND COBLATION FLOW 50 (SURGICAL WAND) ×3 IMPLANT

## 2017-09-02 NOTE — Discharge Instructions (Addendum)
Ice to knee as needed.  Keep dressing clean and dry.  Try to minimize activities this weekend just getting around the house until recheck on Monday.  Resume aspirin, blood thinner  AMBULATORY SURGERY  DISCHARGE INSTRUCTIONS   1) The drugs that you were given will stay in your system until tomorrow so for the next 24 hours you should not:  A) Drive an automobile B) Make any legal decisions C) Drink any alcoholic beverage   2) You may resume regular meals tomorrow.  Today it is better to start with liquids and gradually work up to solid foods.  You may eat anything you prefer, but it is better to start with liquids, then soup and crackers, and gradually work up to solid foods.   3) Please notify your doctor immediately if you have any unusual bleeding, trouble breathing, redness and pain at the surgery site, drainage, fever, or pain not relieved by medication.    4) Additional Instructions:        Please contact your physician with any problems or Same Day Surgery at [phone], Monday through Friday 6 am to 4 pm, or [place] at [facility] number at [phone].

## 2017-09-02 NOTE — Anesthesia Preprocedure Evaluation (Signed)
Anesthesia Evaluation  Patient identified by MRN, date of birth, ID band Patient awake    Reviewed: Allergy & Precautions, H&P , NPO status , Patient's Chart, lab work & pertinent test results, reviewed documented beta blocker date and time   Airway Mallampati: II  TM Distance: >3 FB Neck ROM: full    Dental  (+) Upper Dentures, Lower Dentures   Pulmonary neg pulmonary ROS, former smoker,    Pulmonary exam normal breath sounds clear to auscultation       Cardiovascular Exercise Tolerance: Good + CAD, + Past MI and + Peripheral Vascular Disease  negative cardio ROS   Rhythm:regular Rate:Normal     Neuro/Psych negative neurological ROS  negative psych ROS   GI/Hepatic negative GI ROS, Neg liver ROS,   Endo/Other  Hypothyroidism   Renal/GU negative Renal ROS  negative genitourinary   Musculoskeletal  (+) Arthritis , Osteoarthritis,    Abdominal   Peds  Hematology negative hematology ROS (+)   Anesthesia Other Findings   Reproductive/Obstetrics negative OB ROS                             Anesthesia Physical  Anesthesia Plan  ASA: II  Anesthesia Plan: General   Post-op Pain Management:    Induction:   PONV Risk Score and Plan:   Airway Management Planned: LMA  Additional Equipment:   Intra-op Plan:   Post-operative Plan: Extubation in OR  Informed Consent: I have reviewed the patients History and Physical, chart, labs and discussed the procedure including the risks, benefits and alternatives for the proposed anesthesia with the patient or authorized representative who has indicated his/her understanding and acceptance.     Dental Advisory Given  Plan Discussed with: CRNA  Anesthesia Plan Comments:         Anesthesia Quick Evaluation  

## 2017-09-02 NOTE — Op Note (Signed)
09/02/2017  10:20 AM  PATIENT:  Kevin Glover  77 y.o. male  PRE-OPERATIVE DIAGNOSIS:  COMPLEX TEAR OF MEDIAL MENISCUS OF RIGHT KNEE and lateral meniscus tear  POST-OPERATIVE DIAGNOSIS:  COMPLEX TEAR OF MEDIAL MENISCUS OF RIGHT KNEE and lateral meniscus tear with plica band  PROCEDURE:  Procedure(s): KNEE ARTHROSCOPY WITH PARTIAL MEDIAL AND LATERAL MENISECTOMY, PARTIAL SYNOVECTOMY (Right)  SURGEON: Laurene Footman, MD  ASSISTANTS: None  ANESTHESIA:   general  EBL:  Total I/O In: 400 [I.V.:400] Out: 5 [Blood:5]  BLOOD ADMINISTERED:none  DRAINS: none   LOCAL MEDICATIONS USED:  MARCAINE     SPECIMEN:  No Specimen  DISPOSITION OF SPECIMEN:  N/A  COUNTS:  YES  TOURNIQUET:  * Missing tourniquet times found for documented tourniquets in log: [id number] *  IMPLANTS: None  DICTATION: .Dragon Dictation she was brought in the operating room and after adequate anesthesia was obtained right leg was prepped and draped in sterile fashion.  An arthroscopic leg holder and tourniquet were applied.  After prepping and draping the usual sterile fashion appropriate patient identification and timeout procedures were completed.  An inferolateral portal was made and there is a large amount of effusion in the joint.  Inspection revealed significant suprapatellar synovitis as well as a thick plica band arising from the medial aspect of the knee impinging on the patellofemoral joint.  There is moderately advanced patellofemoral degenerative changes on both sides of the joint with normal tracking of the patella.  Coming on the medial compartment inferior medial portal was made and on probing there was a posterior horn tear with a large flap tear as well as some loose articular cartilage on the femoral condyle which was ablated with the ArthroCare wand as well as debriding the posterior horn involving approximately half of the thickness of the posterior third of the meniscus anterior horn appeared intact.  In  the notch there is no abnormalities in the ACL appeared intact.  Going laterally there is a small tear of the middle third with a small flap tear present this was ablated and the articular cartilage was normal gutters were free of any loose bodies at this point arthroscopic shaver was used to debride the suprapatellar pouch and plica band after this was completed the knee was irrigated until clear and all instrumentation withdrawn.  The wounds were closed with simple interrupted 4-0 nylon skin sutures followed by injection of 20 cc half percent Sensorcaine in the area of the portals followed by Xeroform 4 x 4 web roll and Ace wrap  PLAN OF CARE: Discharge to home after PACU  PATIENT DISPOSITION:  PACU - hemodynamically stable.

## 2017-09-02 NOTE — H&P (Signed)
Reviewed paper H+P, will be scanned into chart. No changes noted.  

## 2017-09-02 NOTE — Transfer of Care (Signed)
Immediate Anesthesia Transfer of Care Note  Patient: Kevin Glover  Procedure(s) Performed: KNEE ARTHROSCOPY WITH PARTIAL MEDIAL AND LATERAL MENISECTOMY, PARTIAL SYNOVECTOMY (Right Knee)  Patient Location: PACU  Anesthesia Type:General  Level of Consciousness: drowsy  Airway & Oxygen Therapy: Patient Spontanous Breathing and Patient connected to face mask oxygen  Post-op Assessment: Report given to RN and Post -op Vital signs reviewed and stable  Post vital signs: Reviewed and stable  Last Vitals:  Vitals Value Taken Time  BP 102/75 09/02/2017 10:19 AM  Temp    Pulse 55 09/02/2017 10:22 AM  Resp 8 09/02/2017 10:22 AM  SpO2 100 % 09/02/2017 10:22 AM  Vitals shown include unvalidated device data.  Last Pain:  Vitals:   09/02/17 0838  TempSrc: Oral  PainSc: 2          Complications: No apparent anesthesia complications

## 2017-09-02 NOTE — Anesthesia Procedure Notes (Signed)
Procedure Name: LMA Insertion Date/Time: 09/02/2017 9:41 AM Performed by: Eben Burow, CRNA Pre-anesthesia Checklist: Patient identified, Emergency Drugs available, Suction available, Patient being monitored and Timeout performed Patient Re-evaluated:Patient Re-evaluated prior to induction Oxygen Delivery Method: Circle system utilized Preoxygenation: Pre-oxygenation with 100% oxygen Induction Type: IV induction LMA: LMA inserted LMA Size: 5.0 Number of attempts: 2 Placement Confirmation: positive ETCO2 and breath sounds checked- equal and bilateral Tube secured with: Tape Dental Injury: Teeth and Oropharynx as per pre-operative assessment

## 2017-09-02 NOTE — Anesthesia Post-op Follow-up Note (Signed)
Anesthesia QCDR form completed.        

## 2017-09-06 ENCOUNTER — Encounter: Payer: Self-pay | Admitting: Orthopedic Surgery

## 2017-09-06 NOTE — Anesthesia Postprocedure Evaluation (Signed)
Anesthesia Post Note  Patient: Kevin Glover  Procedure(s) Performed: KNEE ARTHROSCOPY WITH PARTIAL MEDIAL AND LATERAL MENISECTOMY, PARTIAL SYNOVECTOMY (Right Knee)  Patient location during evaluation: PACU Anesthesia Type: General Level of consciousness: awake and alert and oriented Pain management: pain level controlled Vital Signs Assessment: post-procedure vital signs reviewed and stable Respiratory status: spontaneous breathing Cardiovascular status: blood pressure returned to baseline Anesthetic complications: no     Last Vitals:  Vitals:   09/02/17 1049 09/02/17 1118  BP: 117/86 130/82  Pulse: (!) 58 (!) 53  Resp: 17 16  Temp: 36.6 C (!) 36.1 C  SpO2: 94% 96%    Last Pain:  Vitals:   09/03/17 0849  TempSrc:   PainSc: 0-No pain                 Dashae Wilcher

## 2017-09-08 ENCOUNTER — Encounter (INDEPENDENT_AMBULATORY_CARE_PROVIDER_SITE_OTHER): Payer: Medicare HMO

## 2017-09-08 ENCOUNTER — Ambulatory Visit (INDEPENDENT_AMBULATORY_CARE_PROVIDER_SITE_OTHER): Payer: Medicare HMO | Admitting: Vascular Surgery

## 2017-12-02 DIAGNOSIS — R350 Frequency of micturition: Secondary | ICD-10-CM | POA: Diagnosis not present

## 2017-12-02 DIAGNOSIS — N3000 Acute cystitis without hematuria: Secondary | ICD-10-CM | POA: Diagnosis not present

## 2017-12-16 DIAGNOSIS — C329 Malignant neoplasm of larynx, unspecified: Secondary | ICD-10-CM | POA: Diagnosis not present

## 2017-12-16 DIAGNOSIS — H903 Sensorineural hearing loss, bilateral: Secondary | ICD-10-CM | POA: Diagnosis not present

## 2017-12-22 DIAGNOSIS — I2111 ST elevation (STEMI) myocardial infarction involving right coronary artery: Secondary | ICD-10-CM | POA: Diagnosis not present

## 2017-12-22 DIAGNOSIS — E7800 Pure hypercholesterolemia, unspecified: Secondary | ICD-10-CM | POA: Diagnosis not present

## 2017-12-22 DIAGNOSIS — I2510 Atherosclerotic heart disease of native coronary artery without angina pectoris: Secondary | ICD-10-CM | POA: Diagnosis not present

## 2018-01-13 DIAGNOSIS — Z23 Encounter for immunization: Secondary | ICD-10-CM | POA: Diagnosis not present

## 2018-02-11 DIAGNOSIS — R972 Elevated prostate specific antigen [PSA]: Secondary | ICD-10-CM | POA: Diagnosis not present

## 2018-02-11 DIAGNOSIS — I2510 Atherosclerotic heart disease of native coronary artery without angina pectoris: Secondary | ICD-10-CM | POA: Diagnosis not present

## 2018-02-11 DIAGNOSIS — Z0000 Encounter for general adult medical examination without abnormal findings: Secondary | ICD-10-CM | POA: Diagnosis not present

## 2018-02-11 DIAGNOSIS — E039 Hypothyroidism, unspecified: Secondary | ICD-10-CM | POA: Diagnosis not present

## 2018-02-11 DIAGNOSIS — Z01818 Encounter for other preprocedural examination: Secondary | ICD-10-CM | POA: Diagnosis not present

## 2018-02-18 DIAGNOSIS — N342 Other urethritis: Secondary | ICD-10-CM | POA: Diagnosis not present

## 2018-02-18 DIAGNOSIS — R3129 Other microscopic hematuria: Secondary | ICD-10-CM | POA: Diagnosis not present

## 2018-02-18 DIAGNOSIS — R972 Elevated prostate specific antigen [PSA]: Secondary | ICD-10-CM | POA: Diagnosis not present

## 2018-02-18 DIAGNOSIS — E039 Hypothyroidism, unspecified: Secondary | ICD-10-CM | POA: Diagnosis not present

## 2018-02-18 DIAGNOSIS — I2510 Atherosclerotic heart disease of native coronary artery without angina pectoris: Secondary | ICD-10-CM | POA: Diagnosis not present

## 2018-02-18 DIAGNOSIS — C320 Malignant neoplasm of glottis: Secondary | ICD-10-CM | POA: Diagnosis not present

## 2018-03-08 ENCOUNTER — Encounter: Payer: Self-pay | Admitting: Urology

## 2018-03-08 ENCOUNTER — Ambulatory Visit: Payer: Medicare HMO | Admitting: Urology

## 2018-03-08 DIAGNOSIS — R972 Elevated prostate specific antigen [PSA]: Secondary | ICD-10-CM | POA: Diagnosis not present

## 2018-03-08 DIAGNOSIS — R3129 Other microscopic hematuria: Secondary | ICD-10-CM | POA: Diagnosis not present

## 2018-03-08 DIAGNOSIS — N3281 Overactive bladder: Secondary | ICD-10-CM | POA: Diagnosis not present

## 2018-03-08 LAB — MICROSCOPIC EXAMINATION: EPITHELIAL CELLS (NON RENAL): NONE SEEN /HPF (ref 0–10)

## 2018-03-08 LAB — URINALYSIS, COMPLETE
Bilirubin, UA: NEGATIVE
GLUCOSE, UA: NEGATIVE
Ketones, UA: NEGATIVE
NITRITE UA: NEGATIVE
PH UA: 7 (ref 5.0–7.5)
Specific Gravity, UA: 1.02 (ref 1.005–1.030)
UUROB: 1 mg/dL (ref 0.2–1.0)

## 2018-03-08 NOTE — Progress Notes (Addendum)
03/08/2018 12:03 PM   Kevin Glover [birth year] [id number]  Referring provider: Tracie Harrier, MD [address] [place], [place] [address]  CC: Microscopic hematuria  HPI: I saw Kevin Glover today in urology clinic in consultation for microscopic hematuria.  He is a 77 year old healthy male with no family history of any urologic malignancies who was found to have microscopic hematuria with 4-10 RBCs per high-power field on 02/11/2018.  He was also having some intermittent dysuria at this time, however urine culture was negative.  He was prescribed doxycycline by his PCP which he feels did improve his symptoms slightly.  He does report persistent occasional dysuria over the last 1 to 2 months though.  He does report a history of one urinary tract infection approximately 3 to 4 years ago.  He is a 50-pack-year smoking history and quit over 30 years ago.  His past medical history is also notable for vocal cord malignancy treated with radiation.  In terms of his urinary symptoms, he does report some occasional urgency and frequency, and mild nocturia.  He denies any gross hematuria or blood clots.  Bladder scan in clinic today 36 cc.  He also has a long history of mildly elevated PSA.  Last PSA was 5.88 in April 2019, and has varied from 4.5-6.9 over the last 3 to 4 years.   PMH: Past Medical History:  Diagnosis Date  . 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 Arthritis    shoulder, knees  . Coronary artery disease   . Cough    from throat irritation  . 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 Myocardial infarction ([place])    05/14/17  . Wears dentures    full upper and lower    Surgical History: Past Surgical History:  Procedure Laterality Date  . BROW LIFT Bilateral 09/22/2016   Procedure: BLEPHAROPLASTY;  Surgeon: Karle Starch, MD;  Location: [place];  Service: Ophthalmology;  Laterality:  Bilateral;  . CATARACT EXTRACTION W/PHACO Right 06/02/2016   Procedure: CATARACT EXTRACTION PHACO AND INTRAOCULAR LENS PLACEMENT ([place]) right;  Surgeon: Eulogio Bear, MD;  Location: [place];  Service: Ophthalmology;  Laterality: Right;  . CATARACT EXTRACTION W/PHACO Left 06/30/2016   Procedure: CATARACT EXTRACTION PHACO AND INTRAOCULAR LENS PLACEMENT ([place])  Left;  Surgeon: Eulogio Bear, MD;  Location: [place] [place];  Service: Ophthalmology;  Laterality: Left;  . CORONARY ANGIOPLASTY     1/19  . CORONARY/GRAFT ACUTE MI REVASCULARIZATION N/A 05/12/2017   Procedure: Coronary/Graft Acute MI Revascularization;  Surgeon: Isaias Cowman, MD;  Location: [facility];  Service: Cardiovascular;  Laterality: N/A;  . HERNIA REPAIR    . KNEE ARTHROSCOPY WITH MEDIAL MENISECTOMY Right 09/02/2017   Procedure: KNEE ARTHROSCOPY WITH PARTIAL MEDIAL AND LATERAL MENISECTOMY, PARTIAL SYNOVECTOMY;  Surgeon: Hessie Knows, MD;  Location: [facility];  Service: Orthopedics;  Laterality: Right;  . LEFT HEART CATH AND CORONARY ANGIOGRAPHY N/A 05/12/2017   Procedure: LEFT HEART CATH AND CORONARY ANGIOGRAPHY;  Surgeon: Isaias Cowman, MD;  Location: [facility];  Service: Cardiovascular;  Laterality: N/A;  . MICROLARYNGOSCOPY     3-4 yrs ago  . MICROLARYNGOSCOPY Left 03/22/2015   Procedure: MICROLARYNGOSCOPY WITH EXCISION V CORD LEFT AND BIOPSY;  Surgeon: Beverly Gust, MD;  Location: [place] [facility];  Service: ENT;  Laterality: Left;  . PTOSIS REPAIR Bilateral 09/22/2016   Procedure: PTOSIS REPAIR;  Surgeon: Vickki Muff, Amy  M, MD;  Location: [place] [facility];  Service: Ophthalmology;  Laterality: Bilateral;  . VEIN LIGATION Left     Home Medications:  Allergies as of 03/08/2018   No Known Allergies     Medication List        Accurate as of 03/08/18 12:03 PM. Always use your most recent med list.          aspirin 81 MG chewable tablet Chew 1 tablet (81 mg  total) by mouth daily.   atorvastatin 80 MG tablet Commonly known as:  LIPITOR Take 1 tablet (80 mg total) by mouth daily at 6 PM.   doxycycline 20 MG tablet Commonly known as:  PERIOSTAT Take 1 tablet by mouth 2 (two) times daily.   levothyroxine 25 MCG tablet Commonly known as:  SYNTHROID, LEVOTHROID Take 25 mcg by mouth daily before breakfast.   metoprolol tartrate 25 MG tablet Commonly known as:  LOPRESSOR Take 1 tablet (25 mg total) by mouth 2 (two) times daily.   ticagrelor 90 MG Tabs tablet Commonly known as:  BRILINTA Take 1 tablet (90 mg total) by mouth 2 (two) times daily.       Allergies: No Known Allergies  Family History: Family History  Problem Relation Age of Onset  . Cancer Mother        Melanoma  . Prolactinoma Neg Hx   . Prostate cancer Neg Hx   . Kidney cancer Neg Hx     Social History:  reports that he quit smoking about 29 years ago. His smoking use included cigarettes. He has never used smokeless tobacco. He reports that he drinks about 18.0 standard drinks of alcohol per week. He reports that he does not use drugs.  ROS: Please see flowsheet from today's date for complete review of systems.  Physical Exam:  Constitutional:  Alert and oriented, No acute distress. Cardiovascular: No clubbing, cyanosis, or edema. Respiratory: Normal respiratory effort, no increased work of breathing. GI: Abdomen is soft, nontender, nondistended, no abdominal masses GU: No CVA tenderness, phallus without lesions, widely patent meatus Lymph: No cervical or inguinal lymphadenopathy. Skin: No rashes, bruises or suspicious lesions. Neurologic: Grossly intact, no focal deficits, moving all 4 extremities. Psychiatric: Normal mood and affect.  Assessment & Plan:   In summary, Kevin Glover is a 77 year old male with microscopic hematuria and mild urinary symptoms, as well as extensive smoking history.  Regarding his PSA, he is well past the age of recommended screening  per the AUA guidelines, and his most recent PSA of 5.9 falls within the normal range for his age group.  I would not recommend further PSA screening.  We discussed common possible etiologies of hematuria including BPH, malignancy, urolithiasis, medical renal disease, and idiopathic. Standard workup recommended by the AUA includes imaging with CT urogram to assess the upper tracts, and cystoscopy. Cytology is performed on patient's with gross hematuria to look for malignant cells in the urine.  CT urogram and cystoscopy  Billey Co, MD  [facility] [address] [place], [place] [address] [phone]

## 2018-03-09 LAB — PSA, TOTAL AND FREE
PROSTATE SPECIFIC AG, SERUM: 5 ng/mL — AB (ref 0.0–4.0)
PSA FREE PCT: 13.6 %
PSA FREE: 0.68 ng/mL

## 2018-03-25 ENCOUNTER — Telehealth: Payer: Self-pay | Admitting: Urology

## 2018-03-25 NOTE — Addendum Note (Signed)
Addended by: Billey Co on: 03/25/2018 02:36 PM   Modules accepted: Orders

## 2018-03-25 NOTE — Telephone Encounter (Signed)
CT urogram entered.  Nickolas Madrid, MD 03/25/2018

## 2018-03-25 NOTE — Telephone Encounter (Signed)
Patient called because he never got his CT scan prior to his follow up app with you on Monday. There was never an order put in for the CT scan. Can you put one in so that I can get him scheduled please?   Thanks, Sharyn Lull

## 2018-03-28 ENCOUNTER — Other Ambulatory Visit: Payer: Medicare HMO | Admitting: Urology

## 2018-04-11 ENCOUNTER — Ambulatory Visit
Admission: RE | Admit: 2018-04-11 | Discharge: 2018-04-11 | Disposition: A | Discharge status: Home / Self Care | Payer: Medicare HMO | Source: Ambulatory Visit | Attending: Urology | Admitting: Urology

## 2018-04-11 DIAGNOSIS — R3129 Other microscopic hematuria: Secondary | ICD-10-CM

## 2018-04-11 LAB — POCT I-STAT CREATININE: CREATININE: 1.2 mg/dL (ref 0.61–1.24)

## 2018-04-11 MED ORDER — IOPAMIDOL (ISOVUE-300) INJECTION 61%
125.0000 mL | Freq: Once | INTRAVENOUS | Status: AC | PRN
  Administered 2018-04-11: 125 mL via INTRAVENOUS

## 2018-04-25 ENCOUNTER — Other Ambulatory Visit: Payer: Self-pay | Admitting: Radiology

## 2018-04-25 ENCOUNTER — Telehealth: Payer: Self-pay | Admitting: Radiology

## 2018-04-25 ENCOUNTER — Encounter: Payer: Self-pay | Admitting: Urology

## 2018-04-25 ENCOUNTER — Ambulatory Visit: Payer: Medicare HMO | Admitting: Urology

## 2018-04-25 VITALS — BP 152/95 | HR 73 | Ht 71.0 in | Wt 188.4 lb

## 2018-04-25 DIAGNOSIS — R3129 Other microscopic hematuria: Secondary | ICD-10-CM | POA: Diagnosis not present

## 2018-04-25 DIAGNOSIS — R319 Hematuria, unspecified: Secondary | ICD-10-CM | POA: Diagnosis not present

## 2018-04-25 DIAGNOSIS — N329 Bladder disorder, unspecified: Secondary | ICD-10-CM

## 2018-04-25 LAB — URINALYSIS, COMPLETE
Bilirubin, UA: NEGATIVE
GLUCOSE, UA: NEGATIVE
Ketones, UA: NEGATIVE
Leukocytes, UA: NEGATIVE
NITRITE UA: NEGATIVE
PH UA: 5.5 (ref 5.0–7.5)
Specific Gravity, UA: 1.025 (ref 1.005–1.030)
UUROB: 0.2 mg/dL (ref 0.2–1.0)

## 2018-04-25 LAB — MICROSCOPIC EXAMINATION: Epithelial Cells (non renal): NONE SEEN /hpf (ref 0–10)

## 2018-04-25 MED ORDER — LIDOCAINE HCL URETHRAL/MUCOSAL 2 % EX GEL
1.0000 | Freq: Once | CUTANEOUS | Status: DC
Start: 2018-04-25 — End: 2018-06-03

## 2018-04-25 NOTE — Progress Notes (Signed)
Cystoscopy Procedure Note:  Indication: Microscopic hematuria  After informed consent and discussion of the procedure and its risks, Kevin Glover was positioned and prepped in the standard fashion. Cystoscopy was performed with a flexible cystoscope. The urethra, bladder neck and entire bladder was visualized in a standard fashion. The prostate was large with small intravesical lobe.  There were multiple small 1 cm patches of erythema worrisome for possible carcinoma in situ.  Imaging: CT urogram personally reviewed, no renal masses, filling defects, or nephrolithiasis  Findings: Multiple small patches of erythema worrisome for carcinoma in situ  Assessment and Plan: History of MI January 2019, has cardiology follow-up later this month to discuss stopping anticoagulation(Brilinta).  OR early February for cystoscopy, bladder biopsy, fulguration after anticoagulation stopped  Nickolas Madrid, MD 04/25/2018

## 2018-04-25 NOTE — Patient Instructions (Signed)
Bladder Biopsy  A bladder biopsy is a procedure to remove a small sample of tissue from the bladder. The procedure is done so that the tissue can be examined under a microscope. You may have a bladder biopsy to diagnose or rule out bladder cancer. During your bladder biopsy, your health care provider may insert a long, thin scope with a tiny lighted camera (cystoscope) into the tube that leads from your bladder to the outside of your body (urethra) and move it into your bladder. The cystoscope will allow your health care provider to examine the lining of the urethra and bladder and remove the tissue sample. If your health provider also needs to examine the tubes that connect the kidneys to the bladder (ureters), a longer tube (ureteroscope) may be used. Tell a health care provider about:  Any allergies you have.  All medicines you are taking, including vitamins, herbs, eye drops, creams, and over-the-counter medicines.  Any problems you or family members have had with anesthetic medicines.  Any blood disorders you have.  Any surgeries you have had.  Any medical conditions you have.  Whether you are pregnant or may be pregnant. What are the risks? Generally, this is a safe procedure. However, problems may occur, including:  Unusual bleeding.  Infection, especially a urinary tract infection (UTI).  Allergic reactions to medicines.  Damage to the surrounding organs, including the urethra, bladder, or ureters.  Abdominal pain.  Burning or pain during urination.  Narrowing of the urethra due to scar tissue.  Difficulty urinating due to swelling. What happens before the procedure?  You may be given antibiotic medicine to help prevent infection.  Ask your health care provider about: ? Changing or stopping your regular medicines. This is especially important if you are taking diabetes medicines or blood thinners. ? Taking medicines such as aspirin and ibuprofen. These medicines can  thin your blood. Do not take these medicines before your procedure if your health care provider instructs you not to.  Follow instructions from your health care provider about eating and drinking restrictions.  You may be asked to drink plenty of fluids.  You may be asked to urinate right before the procedure. You may have a urine sample taken for UTI testing.  Plan to have someone take you home from the hospital or clinic.  If you will be going home right after the procedure, plan to have someone with you for 24 hours. What happens during the procedure?  To lower your risk of infection: ? Your health care team will wash or sanitize their hands. ? Your skin will be washed with soap.  An IV tube may be inserted into one of your veins.  You may be given one or more of the following: ? A medicine to help you relax (sedative). ? A medicine to numb the opening of the urethra (local anesthetic). ? A medicine to make you fall asleep (general anesthetic). ? A medicine that is injected into your spine to numb the area below and slightly above the injection site (spinal anesthetic). ? A medicine that is injected into an area of your body to numb everything below the injection site (regional anesthetic).  You will lie on your back with your knees bent and spread apart.  The cystoscope or ureteroscope will be inserted into your urethra and guided into your bladder or ureters.  Your bladder may be slowly filled with germ-free (sterile) water. This will make it easier for your health care provider to view  the wall or lining of your bladder.  Small instruments will be inserted through the scope to collect a small tissue sample that will be examined under a microscope. The procedure may vary among health care providers and hospitals. What happens after the procedure?  You may be asked to empty your bladder, or your bladder may be emptied for you.  Your blood pressure, heart rate, breathing  rate, and blood oxygen level will be monitored until the medicines you were given wear off.  Do not drive for 24 hours if you received a sedative. This information is not intended to replace advice given to you by your health care provider. Make sure you discuss any questions you have with your health care provider. Document Released: 04/23/2015 Document Revised: 09/12/2015 Document Reviewed: 04/23/2015 Elsevier Interactive Patient Education  2019 [employer].    Bladder Biopsy, Care After Refer to this sheet in the next few weeks. These instructions provide you with information about caring for yourself after your procedure. Your health care provider may also give you more specific instructions. Your treatment has been planned according to current medical practices, but problems sometimes occur. Call your health care provider if you have any problems or questions after your procedure. What can I expect after the procedure? After the procedure, it is common to have:  Mild pain in your bladder or kidney area during urination.  Minor burning during urination.  Small amounts of blood in your urine.  A sudden urge to urinate.  A need to urinate more often than usual. Follow these instructions at home: Medicines  Take over-the-counter and prescription medicines only as told by your health care provider.  If you were prescribed an antibiotic medicine, take it as told by your health care provider. Do not stop taking the antibiotic even if you start to feel better. General instructions   Take a warm bath to relieve any burning sensations around your urethra.  Hold a warm, damp washcloth over the urethral area to ease pain.  Return to your normal activities as told by your health care provider. Ask your health care provider what activities are safe for you.  Do not drive for 24 hours if you received a medicine to help you relax (sedative) during your procedure. Ask your health care  provider when it is safe for you to drive.  It is your responsibility to get the results of your procedure. Ask your health care provider or the department performing the procedure when your results will be ready.  Keep all follow-up visits as told by your health care provider. This is important. Contact a health care provider if:  You have a fever.  Your symptoms do not improve within 24 hours and you continue to have: ? Burning during urination. ? Increasing amounts of blood in your urine. ? Pain during urination. ? An urgent need to urinate. ? A need to urinate more often than usual. Get help right away if:  You have a lot of bleeding or more bleeding.  You have severe pain.  You are unable to urinate.  You have bright red blood in your urine.  You are passing blood clots in your urine.  You have a fever.  You have swelling, redness, or pain in your legs.  You have difficulty breathing. This information is not intended to replace advice given to you by your health care provider. Make sure you discuss any questions you have with your health care provider. Document Released: 04/23/2015 Document  Revised: 09/12/2015 Document Reviewed: 04/23/2015 Elsevier Interactive Patient Education  [employer].

## 2018-04-25 NOTE — Telephone Encounter (Signed)
Patient was given the [facility] Information form below as well as the Instructions for Pre-Admission Testing form & a map of [facility].   Kevin Glover, Kevin Glover, [place] [address] Telephone: [phone] Fax: [phone]   Thank you for choosing [facility] for your upcoming surgery!  We are always here to assist in your urological needs.  Please read the following information with specific details for your upcoming appointments related to your surgery. Please contact Marsalis Beaulieu at [phone] Option 3 with any questions.  The Name of Your Surgery: Cystoscopy, bladder biopsy, fulgeration Your Surgery Date: 06/03/2018 Your Surgeon: Nickolas Madrid  Please call Same Day Surgery at [phone] between the hours of 1pm-3pm one day prior to your surgery. They will inform you of the time to arrive at Same Day Surgery which is located on the second floor of the [facility].   Please refer to the attached letter regarding instructions for Pre-Admission Testing. You will receive a call from the [place] office regarding your appointment with them.  The Pre-Admission Testing office is located at [place], on the first floor of the [place] [facility] in [place] (office is to the right as you enter through the [employer] of the [facility]). Please have all medications you are currently taking and your insurance card available.   Patient was advised to have nothing to eat or drink after midnight the night prior to surgery except that he may have only water until 2 hours before surgery with nothing to drink within 2 hours of surgery.  The patient states he currently takes aspirin 81mg  & Brilinta but has an appointment with Dr Saralyn Pilar on 05/11/2018 where he expects to be taken off of all blood thinners. He will be notified of  instructions regarding blood thinners once clearance is obtained after this appointment. Patient's questions were answered and he expressed understanding of these instructions.

## 2018-04-26 ENCOUNTER — Other Ambulatory Visit: Payer: Self-pay | Admitting: Radiology

## 2018-04-28 ENCOUNTER — Other Ambulatory Visit: Payer: Self-pay | Admitting: Urology

## 2018-05-11 DIAGNOSIS — I2510 Atherosclerotic heart disease of native coronary artery without angina pectoris: Secondary | ICD-10-CM | POA: Diagnosis not present

## 2018-05-11 DIAGNOSIS — I2111 ST elevation (STEMI) myocardial infarction involving right coronary artery: Secondary | ICD-10-CM | POA: Diagnosis not present

## 2018-05-11 DIAGNOSIS — E7800 Pure hypercholesterolemia, unspecified: Secondary | ICD-10-CM | POA: Diagnosis not present

## 2018-05-11 NOTE — Telephone Encounter (Signed)
Unable to reach patient by phone & no voicemail has been set up. Notified wife that clearance has been received from Dr Saralyn Pilar stating Kevin Glover has been discontinued & for patient to hold aspirin 81mg  for 7 days prior to surgery beginning 05/27/2018. Questions answered. Wife expresses understanding of instructions.

## 2018-05-23 ENCOUNTER — Encounter
Admission: RE | Admit: 2018-05-23 | Discharge: 2018-05-23 | Disposition: A | Discharge status: Home / Self Care | Payer: Medicare HMO | Source: Ambulatory Visit | Attending: Urology | Admitting: Urology

## 2018-05-23 ENCOUNTER — Other Ambulatory Visit: Payer: Self-pay

## 2018-05-23 DIAGNOSIS — Z01818 Encounter for other preprocedural examination: Secondary | ICD-10-CM | POA: Diagnosis not present

## 2018-05-23 DIAGNOSIS — I10 Essential (primary) hypertension: Secondary | ICD-10-CM | POA: Insufficient documentation

## 2018-05-23 LAB — URINALYSIS, ROUTINE W REFLEX MICROSCOPIC
Bilirubin Urine: NEGATIVE
Glucose, UA: NEGATIVE mg/dL
Hgb urine dipstick: NEGATIVE
Ketones, ur: NEGATIVE mg/dL
Leukocytes, UA: NEGATIVE
Nitrite: NEGATIVE
PH: 6 (ref 5.0–8.0)
Protein, ur: NEGATIVE mg/dL
Specific Gravity, Urine: 1.02 (ref 1.005–1.030)

## 2018-05-23 LAB — CBC
HCT: 42.2 % (ref 39.0–52.0)
Hemoglobin: 13.8 g/dL (ref 13.0–17.0)
MCH: 30.1 pg (ref 26.0–34.0)
MCHC: 32.7 g/dL (ref 30.0–36.0)
MCV: 91.9 fL (ref 80.0–100.0)
NRBC: 0 % (ref 0.0–0.2)
PLATELETS: 234 10*3/uL (ref 150–400)
RBC: 4.59 MIL/uL (ref 4.22–5.81)
RDW: 14 % (ref 11.5–15.5)
WBC: 7.1 10*3/uL (ref 4.0–10.5)

## 2018-05-23 LAB — BASIC METABOLIC PANEL
ANION GAP: 8 (ref 5–15)
BUN: 20 mg/dL (ref 8–23)
CO2: 23 mmol/L (ref 22–32)
Calcium: 9.5 mg/dL (ref 8.9–10.3)
Chloride: 106 mmol/L (ref 98–111)
Creatinine, Ser: 0.87 mg/dL (ref 0.61–1.24)
GFR calc Af Amer: >60 mL/min (ref >60)
Glucose, Bld: 96 mg/dL (ref 70–99)
POTASSIUM: 3.9 mmol/L (ref 3.5–5.1)
SODIUM: 137 mmol/L (ref 135–145)

## 2018-05-23 NOTE — Patient Instructions (Signed)
Your procedure is scheduled on: Friday 06/03/18.   Report to DAY SURGERY DEPARTMENT LOCATED ON 2ND FLOOR MEDICAL MALL ENTRANCE. To find out your arrival time please call [phone] between 1PM - 3PM on Thursday 06/02/18.   Remember: Instructions that are not followed completely may result in serious medical risk, up to and including death, or upon the discretion of your surgeon and anesthesiologist your surgery may need to be rescheduled.      _X__ 1. Do not eat food after midnight the night before your procedure.                 No gum chewing or hard candies. You may drink clear liquids up to 2 hours                 before you are scheduled to arrive for your surgery- DO NOT drink clear                 liquids within 2 hours of the start of your surgery.                 Clear Liquids include:  water, apple juice without pulp, clear carbohydrate                 drink such as Clearfast or Gatorade, Black Coffee or Tea (Do not add                 milk or creamer to coffee or tea).   __X__2.  On the morning of surgery brush your teeth with toothpaste and water, you may rinse your mouth with mouthwash if you wish.  Do not swallow any toothpaste or mouthwash.      _X__ 3.  No Alcohol for 24 hours before or after surgery.   __X__6.  Notify your doctor if there is any change in your medical condition      (cold, fever, infections).     Do not wear jewelry, make-up, hairpins, clips or nail polish. Do not wear lotions, powders, or perfumes. You may wear your deodorant. Do not shave 48 hours prior to surgery. Men may shave face and neck. Do not bring valuables to the hospital.    [facility] is not responsible for any belongings or valuables.   Contacts, dentures/partials or body piercings may not be worn into surgery. Bring a case for your contacts, glasses or hearing aids, a denture cup will be supplied.    Patients discharged the day of surgery will not be allowed to drive  home.    Please read over the following fact sheets that you were given:   MRSA Information    __X__ Take these medicines the morning of surgery with A SIP OF WATER:     1. levothyroxine (SYNTHROID, LEVOTHROID) 25 MCG tablet  2. metoprolol tartrate (LOPRESSOR) 25 MG tablet    __X__ Stop Blood Thinners: Aspirin according to your cardiologist's recommendations.   __X__ Stop Anti-inflammatories 7 days before surgery such as Advil, Ibuprofen, Motrin, BC or Goodies Powder, Naprosyn, Naproxen, Aleve, Aspirin, Meloxicam. May take Tylenol if needed for pain or discomfort.    __X__ Do not begin taking any new herbal supplements before your surgery.

## 2018-05-24 LAB — URINE CULTURE: Culture: NO GROWTH

## 2018-05-31 ENCOUNTER — Other Ambulatory Visit: Payer: Self-pay | Admitting: Radiology

## 2018-06-03 ENCOUNTER — Ambulatory Visit: Payer: Medicare HMO

## 2018-06-03 ENCOUNTER — Encounter
Admission: RE | Disposition: A | Discharge status: Home / Self Care | Payer: Self-pay | Source: Home / Self Care | Attending: Urology

## 2018-06-03 ENCOUNTER — Ambulatory Visit
Admission: RE | Admit: 2018-06-03 | Discharge: 2018-06-03 | Disposition: A | Discharge status: Home / Self Care | Payer: Medicare HMO | Attending: Urology | Admitting: Urology

## 2018-06-03 DIAGNOSIS — Z87891 Personal history of nicotine dependence: Secondary | ICD-10-CM | POA: Insufficient documentation

## 2018-06-03 DIAGNOSIS — N3289 Other specified disorders of bladder: Secondary | ICD-10-CM | POA: Diagnosis not present

## 2018-06-03 DIAGNOSIS — Z9842 Cataract extraction status, left eye: Secondary | ICD-10-CM | POA: Diagnosis not present

## 2018-06-03 DIAGNOSIS — R3129 Other microscopic hematuria: Secondary | ICD-10-CM | POA: Diagnosis not present

## 2018-06-03 DIAGNOSIS — I2510 Atherosclerotic heart disease of native coronary artery without angina pectoris: Secondary | ICD-10-CM | POA: Insufficient documentation

## 2018-06-03 DIAGNOSIS — I252 Old myocardial infarction: Secondary | ICD-10-CM | POA: Insufficient documentation

## 2018-06-03 DIAGNOSIS — N329 Bladder disorder, unspecified: Secondary | ICD-10-CM | POA: Insufficient documentation

## 2018-06-03 DIAGNOSIS — I739 Peripheral vascular disease, unspecified: Secondary | ICD-10-CM | POA: Insufficient documentation

## 2018-06-03 DIAGNOSIS — Z808 Family history of malignant neoplasm of other organs or systems: Secondary | ICD-10-CM | POA: Insufficient documentation

## 2018-06-03 DIAGNOSIS — M19019 Primary osteoarthritis, unspecified shoulder: Secondary | ICD-10-CM | POA: Diagnosis not present

## 2018-06-03 DIAGNOSIS — E039 Hypothyroidism, unspecified: Secondary | ICD-10-CM | POA: Insufficient documentation

## 2018-06-03 DIAGNOSIS — M170 Bilateral primary osteoarthritis of knee: Secondary | ICD-10-CM | POA: Diagnosis not present

## 2018-06-03 DIAGNOSIS — Z9841 Cataract extraction status, right eye: Secondary | ICD-10-CM | POA: Diagnosis not present

## 2018-06-03 DIAGNOSIS — Z8521 Personal history of malignant neoplasm of larynx: Secondary | ICD-10-CM | POA: Diagnosis not present

## 2018-06-03 HISTORY — PX: CYSTOSCOPY WITH FULGERATION: SHX6638

## 2018-06-03 HISTORY — PX: CYSTOSCOPY WITH BIOPSY: SHX5122

## 2018-06-03 SURGERY — CYSTOSCOPY, WITH BIOPSY
Anesthesia: General

## 2018-06-03 MED ORDER — FENTANYL CITRATE (PF) 100 MCG/2ML IJ SOLN
INTRAMUSCULAR | Status: DC | PRN
  Administered 2018-06-03: 50 ug via INTRAVENOUS
  Administered 2018-06-03: 25 ug via INTRAVENOUS

## 2018-06-03 MED ORDER — ONDANSETRON HCL 4 MG/2ML IJ SOLN: 4.0000 mg | Freq: Once | INTRAMUSCULAR | Status: DC | PRN

## 2018-06-03 MED ORDER — MIDAZOLAM HCL 2 MG/2ML IJ SOLN
INTRAMUSCULAR | Status: AC
  Filled 2018-06-03: qty 2

## 2018-06-03 MED ORDER — ONDANSETRON HCL 4 MG/2ML IJ SOLN
INTRAMUSCULAR | Status: DC | PRN
  Administered 2018-06-03: 4 mg via INTRAVENOUS

## 2018-06-03 MED ORDER — CEFAZOLIN SODIUM-DEXTROSE 2-4 GM/100ML-% IV SOLN
2.0000 g | INTRAVENOUS | Status: AC
  Administered 2018-06-03: 2 g via INTRAVENOUS

## 2018-06-03 MED ORDER — FAMOTIDINE 20 MG PO TABS
ORAL_TABLET | ORAL | Status: AC
  Administered 2018-06-03: 20 mg via ORAL
  Filled 2018-06-03: qty 1

## 2018-06-03 MED ORDER — ONDANSETRON HCL 4 MG/2ML IJ SOLN
INTRAMUSCULAR | Status: AC
  Filled 2018-06-03: qty 2

## 2018-06-03 MED ORDER — FENTANYL CITRATE (PF) 250 MCG/5ML IJ SOLN
INTRAMUSCULAR | Status: AC
  Filled 2018-06-03: qty 5

## 2018-06-03 MED ORDER — FAMOTIDINE 20 MG PO TABS
20.0000 mg | ORAL_TABLET | Freq: Once | ORAL | Status: AC
  Administered 2018-06-03: 20 mg via ORAL

## 2018-06-03 MED ORDER — SUCCINYLCHOLINE CHLORIDE 20 MG/ML IJ SOLN
INTRAMUSCULAR | Status: AC
  Filled 2018-06-03: qty 1

## 2018-06-03 MED ORDER — PHENYLEPHRINE HCL 10 MG/ML IJ SOLN
INTRAMUSCULAR | Status: DC | PRN
  Administered 2018-06-03: 100 ug via INTRAVENOUS

## 2018-06-03 MED ORDER — LIDOCAINE HCL (PF) 2 % IJ SOLN
INTRAMUSCULAR | Status: AC
  Filled 2018-06-03: qty 10

## 2018-06-03 MED ORDER — LACTATED RINGERS IV SOLN
INTRAVENOUS | Status: DC
  Administered 2018-06-03: 06:00:00 via INTRAVENOUS

## 2018-06-03 MED ORDER — CEFAZOLIN SODIUM-DEXTROSE 2-4 GM/100ML-% IV SOLN
INTRAVENOUS | Status: AC
  Filled 2018-06-03: qty 100

## 2018-06-03 MED ORDER — BELLADONNA ALKALOIDS-OPIUM 16.2-60 MG RE SUPP
1.0000 | Freq: Four times a day (QID) | RECTAL | Status: DC | PRN
  Filled 2018-06-03: qty 1

## 2018-06-03 MED ORDER — MIDAZOLAM HCL 2 MG/2ML IJ SOLN
INTRAMUSCULAR | Status: DC | PRN
  Administered 2018-06-03: 1 mg via INTRAVENOUS

## 2018-06-03 MED ORDER — PROPOFOL 10 MG/ML IV BOLUS
INTRAVENOUS | Status: DC | PRN
  Administered 2018-06-03: 150 mg via INTRAVENOUS

## 2018-06-03 MED ORDER — ROCURONIUM BROMIDE 50 MG/5ML IV SOLN
INTRAVENOUS | Status: AC
  Filled 2018-06-03: qty 1

## 2018-06-03 MED ORDER — DEXAMETHASONE SODIUM PHOSPHATE 10 MG/ML IJ SOLN
INTRAMUSCULAR | Status: AC
  Filled 2018-06-03: qty 1

## 2018-06-03 MED ORDER — PROPOFOL 10 MG/ML IV BOLUS
INTRAVENOUS | Status: AC
  Filled 2018-06-03: qty 20

## 2018-06-03 MED ORDER — FENTANYL CITRATE (PF) 100 MCG/2ML IJ SOLN: 25.0000 ug | INTRAMUSCULAR | Status: DC | PRN

## 2018-06-03 MED ORDER — LIDOCAINE HCL (CARDIAC) PF 100 MG/5ML IV SOSY
PREFILLED_SYRINGE | INTRAVENOUS | Status: DC | PRN
  Administered 2018-06-03: 100 mg via INTRAVENOUS

## 2018-06-03 SURGICAL SUPPLY — 21 items
BAG DRAIN CYSTO-URO LG1000N (MISCELLANEOUS) ×3 IMPLANT
BRUSH SCRUB EZ  4% CHG (MISCELLANEOUS) ×2
BRUSH SCRUB EZ 4% CHG (MISCELLANEOUS) ×1 IMPLANT
DRSG TELFA 4X3 1S NADH ST (GAUZE/BANDAGES/DRESSINGS) ×3 IMPLANT
ELECT REM PT RETURN 9FT ADLT (ELECTROSURGICAL) ×3
ELECTRODE REM PT RTRN 9FT ADLT (ELECTROSURGICAL) ×1 IMPLANT
GLOVE BIOGEL PI IND STRL 7.5 (GLOVE) ×1 IMPLANT
GLOVE BIOGEL PI INDICATOR 7.5 (GLOVE) ×2
GOWN STRL REUS W/ TWL LRG LVL3 (GOWN DISPOSABLE) ×1 IMPLANT
GOWN STRL REUS W/ TWL XL LVL3 (GOWN DISPOSABLE) ×1 IMPLANT
GOWN STRL REUS W/TWL LRG LVL3 (GOWN DISPOSABLE) ×2
GOWN STRL REUS W/TWL XL LVL3 (GOWN DISPOSABLE) ×2
GUIDEWIRE STR DUAL SENSOR (WIRE) IMPLANT
KIT TURNOVER CYSTO (KITS) ×3 IMPLANT
PACK CYSTO AR (MISCELLANEOUS) ×3 IMPLANT
SET CYSTO W/LG BORE CLAMP LF (SET/KITS/TRAYS/PACK) ×3 IMPLANT
SOL .9 NS 3000ML IRR  AL (IV SOLUTION)
SOL .9 NS 3000ML IRR UROMATIC (IV SOLUTION) IMPLANT
SURGILUBE 2OZ TUBE FLIPTOP (MISCELLANEOUS) ×3 IMPLANT
WATER STERILE IRR 1000ML POUR (IV SOLUTION) ×3 IMPLANT
WATER STERILE IRR 3000ML UROMA (IV SOLUTION) ×6 IMPLANT

## 2018-06-03 NOTE — Discharge Instructions (Signed)

## 2018-06-03 NOTE — H&P (Signed)
Kevin Glover 06/26/1940 [id number]   HPI: Kevin Glover is a 78 year old male with cardiac history and smoking history who was found of microscopic hematuria and multiple small erythematous patches in the bladder concerning for possible carcinoma in situ.  He is no longer on anticoagulation from his myocardial infarction in January 2019.  He denies chest pain or shortness of breath.   PMH: Past Medical History:  Diagnosis Date  . 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 Arthritis    shoulder, knees  . Coronary artery disease   . Cough    from throat irritation  . 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 Myocardial infarction ([facility])    05/14/17  . Wears dentures    full upper and lower    Surgical History: Past Surgical History:  Procedure Laterality Date  . BROW LIFT Bilateral 09/22/2016   Procedure: BLEPHAROPLASTY;  Surgeon: Karle Starch, MD;  Location: [facility];  Service: Ophthalmology;  Laterality: Bilateral;  . CATARACT EXTRACTION W/PHACO Right 06/02/2016   Procedure: CATARACT EXTRACTION PHACO AND INTRAOCULAR LENS PLACEMENT ([facility]) right;  Surgeon: Eulogio Bear, MD;  Location: [facility];  Service: Ophthalmology;  Laterality: Right;  . CATARACT EXTRACTION W/PHACO Left 06/30/2016   Procedure: CATARACT EXTRACTION PHACO AND INTRAOCULAR LENS PLACEMENT ([facility])  Left;  Surgeon: Eulogio Bear, MD;  Location: [facility];  Service: Ophthalmology;  Laterality: Left;  . CORONARY ANGIOPLASTY     1/19  . CORONARY/GRAFT ACUTE MI REVASCULARIZATION N/A 05/12/2017   Procedure: Coronary/Graft Acute MI Revascularization;  Surgeon: Isaias Cowman, MD;  Location: [facility] CV [facility];  Service: Cardiovascular;  Laterality: N/A;  . HERNIA REPAIR    . KNEE ARTHROSCOPY WITH MEDIAL MENISECTOMY Right 09/02/2017   Procedure: KNEE ARTHROSCOPY WITH PARTIAL MEDIAL AND LATERAL MENISECTOMY, PARTIAL SYNOVECTOMY;  Surgeon: Hessie Knows, MD;  Location: [facility];  Service: Orthopedics;  Laterality: Right;  . LEFT HEART CATH AND CORONARY ANGIOGRAPHY N/A 05/12/2017   Procedure: LEFT HEART CATH AND CORONARY ANGIOGRAPHY;  Surgeon: Isaias Cowman, MD;  Location: [facility] CV LAB;  Service: Cardiovascular;  Laterality: N/A;  . MICROLARYNGOSCOPY     3-4 yrs ago  . MICROLARYNGOSCOPY Left 03/22/2015   Procedure: MICROLARYNGOSCOPY WITH EXCISION V CORD LEFT AND BIOPSY;  Surgeon: Beverly Gust, MD;  Location: [facility];  Service: ENT;  Laterality: Left;  . PTOSIS REPAIR Bilateral 09/22/2016   Procedure: PTOSIS REPAIR;  Surgeon: Karle Starch, MD;  Location: [facility];  Service: Ophthalmology;  Laterality: Bilateral;  . VEIN LIGATION Left     Allergies: No Known Allergies  Family History: Family History  Problem Relation Age of Onset  . Cancer Mother        Melanoma  . Prolactinoma Neg Hx   . Prostate cancer Neg Hx   . Kidney cancer Neg Hx     Social History:  reports that he quit smoking about 30 years ago. His smoking use included cigarettes. He has never used smokeless tobacco. He reports current alcohol use of about 18.0 standard drinks of alcohol per week. He reports that he does not use drugs.  ROS: Negative aside from those stated in the HPI  Physical Exam: BP (!) 158/91   Pulse 65   Temp 98.7 F (37.1 C) (Oral)   Resp 14   Ht 5\' 11"  (1.803 m)   Wt 86.8 kg   SpO2 97%   BMI 26.69  kg/m    Constitutional:  Alert and oriented, No acute distress. Cardiovascular: Regular rate and rhythm Respiratory: Clear to auscultation bilaterally GI: Abdomen is soft, nontender, nondistended, no abdominal masses GU: No CVA tenderness Lymph: No cervical or inguinal lymphadenopathy. Skin: No rashes, bruises or suspicious lesions. Neurologic: Grossly intact, no focal deficits, moving all 4 extremities. Psychiatric: Normal mood and affect.  Laboratory Data: Reviewed  Urine culture 2/3 no  growth  Assessment & Plan:   Patient is a 78 year old male with extensive smoking history and dysuria and on microscopic hematuria work-up was found to have multiple small erythematous patches in the bladder concerning for carcinoma in situ.  Office cytology was negative.  We discussed the risks and benefits of cystoscopy, bladder biopsy, and fulguration.  We specifically discussed the risks of bleeding, infection, need for additional procedures, need for further management based on pathology findings, possible temporary Foley placement, and pain and dysuria.  Proceed with cystoscopy, bladder biopsy, fulguration  Billey Co, MD  [facility] [address] [place], [place] [address] [phone]

## 2018-06-03 NOTE — Transfer of Care (Signed)
Immediate Anesthesia Transfer of Care Note  Patient: Kevin Glover  Procedure(s) Performed: CYSTOSCOPY WITH bladder BIOPSY (N/A ) CYSTOSCOPY WITH FULGERATION (N/A )  Patient Location: PACU  Anesthesia Type:General  Level of Consciousness: drowsy  Airway & Oxygen Therapy: Patient Spontanous Breathing and Patient connected to face mask oxygen  Post-op Assessment: Report given to RN and Post -op Vital signs reviewed and stable  Post vital signs: Reviewed and stable  Last Vitals:  Vitals Value Taken Time  BP 100/69 06/03/2018  8:21 AM  Temp    Pulse 57 06/03/2018  8:22 AM  Resp 9 06/03/2018  8:22 AM  SpO2 99 % 06/03/2018  8:22 AM  Vitals shown include unvalidated device data.  Last Pain:  Vitals:   06/03/18 0611  TempSrc: Oral         Complications: No apparent anesthesia complications

## 2018-06-03 NOTE — Progress Notes (Signed)
Pt. Voided 125 ml of pink urine , no clots noted .

## 2018-06-03 NOTE — Anesthesia Procedure Notes (Signed)
Procedure Name: LMA Insertion Date/Time: 06/03/2018 7:45 AM Performed by: Alvin Critchley, MD Pre-anesthesia Checklist: Patient identified, Suction available, Emergency Drugs available, Patient being monitored and Timeout performed Patient Re-evaluated:Patient Re-evaluated prior to induction Oxygen Delivery Method: Circle system utilized Preoxygenation: Pre-oxygenation with 100% oxygen Induction Type: IV induction Ventilation: Mask ventilation without difficulty LMA: LMA inserted LMA Size: 5.0 Number of attempts: 2 Tube secured with: Tape Dental Injury: Teeth and Oropharynx as per pre-operative assessment

## 2018-06-03 NOTE — Op Note (Signed)
Date of procedure: 06/03/18  Preoperative diagnosis:  1. Bladder lesion  Postoperative diagnosis:  1. Same  Procedure: 1. Cystoscopy, bladder biopsy, and fulguration  Surgeon: Nickolas Madrid, MD  Anesthesia: General  Complications: None  Intraoperative findings:  1.  Moderate size prostate, minimal bladder trabeculations, ureteral orifices orthotopic bilaterally 2.  3 small patches of erythema, approximately 1 cm each, located at each lateral wall, and the posterior wall/dome 3.  All abnormal lesions removed with cold cup biopsy forceps, and fulgurated.  Excellent hemostasis  EBL: Minimal  Specimens: Bladder biopsy  Drains: None  Indication: Kevin Glover is a 78 y.o. patient with microscopic hematuria who was found to have multiple small patches of erythema in the bladder worrisome for possible carcinoma in situ or urothelial cell carcinoma.  Cytology was negative.  After reviewing the management options for treatment, they elected to proceed with the above surgical procedure(s). We have discussed the potential benefits and risks of the procedure, side effects of the proposed treatment, the likelihood of the patient achieving the goals of the procedure, and any potential problems that might occur during the procedure or recuperation. Informed consent has been obtained.  Description of procedure:  The patient was taken to the operating room and general anesthesia was induced.  The patient was placed in the dorsal lithotomy position, prepped and draped in the usual sterile fashion, and preoperative antibiotics were administered. A preoperative time-out was performed.   On exam, the patient had multiple skin nodules in the scrotum bilaterally.  We had previously discussed this in clinic, and he stated they have been there for over 20 years and were minimally bothersome to him.  We started by inserting a 21 French rigid cystoscope with a 30 degree lens into the bladder.  The prostate  was moderate in size.  Thorough bladder inspection was performed and revealed ureteral orifices that were orthotopic bilaterally.  There were 3 small patches of erythema in the bladder approximately 1 cm each, and they were located at each lateral wall well away from the ureteral orifices, and the posterior wall/dome.  These lesions were removed entirely with cold cup biopsy forceps, and the Bugbee was used to achieve excellent hemostasis.  All abnormal appearing tissue was removed and fulgurated.  Excellent hemostasis was achieved under low flow conditions.  Disposition: Stable to PACU  Plan: Must void prior to discharge Okay to resume anticoagulation in 5 days Follow-up in 1 week to discuss pathology results  Nickolas Madrid, MD

## 2018-06-03 NOTE — Anesthesia Preprocedure Evaluation (Signed)
Anesthesia Evaluation  Patient identified by MRN, date of birth, ID band Patient awake    Reviewed: Allergy & Precautions, H&P , NPO status , Patient's Chart, lab work & pertinent test results, reviewed documented beta blocker date and time   Airway Mallampati: II  TM Distance: >3 FB Neck ROM: full    Dental  (+) Upper Dentures, Lower Dentures   Pulmonary neg pulmonary ROS, former smoker,    Pulmonary exam normal breath sounds clear to auscultation       Cardiovascular Exercise Tolerance: Good + CAD, + Past MI and + Peripheral Vascular Disease  negative cardio ROS   Rhythm:regular Rate:Normal     Neuro/Psych negative neurological ROS  negative psych ROS   GI/Hepatic negative GI ROS, Neg liver ROS,   Endo/Other  Hypothyroidism   Renal/GU negative Renal ROS  negative genitourinary   Musculoskeletal  (+) Arthritis , Osteoarthritis,    Abdominal   Peds  Hematology negative hematology ROS (+)   Anesthesia Other Findings   Reproductive/Obstetrics negative OB ROS                             Anesthesia Physical  Anesthesia Plan  ASA: II  Anesthesia Plan: General   Post-op Pain Management:    Induction:   PONV Risk Score and Plan:   Airway Management Planned: LMA  Additional Equipment:   Intra-op Plan:   Post-operative Plan: Extubation in OR  Informed Consent: I have reviewed the patients History and Physical, chart, labs and discussed the procedure including the risks, benefits and alternatives for the proposed anesthesia with the patient or authorized representative who has indicated his/her understanding and acceptance.     Dental Advisory Given  Plan Discussed with: CRNA  Anesthesia Plan Comments:         Anesthesia Quick Evaluation

## 2018-06-03 NOTE — Anesthesia Post-op Follow-up Note (Signed)
Anesthesia QCDR form completed.        

## 2018-06-05 NOTE — Anesthesia Postprocedure Evaluation (Signed)
Anesthesia Post Note  Patient: Kevin Glover  Procedure(s) Performed: CYSTOSCOPY WITH bladder BIOPSY (N/A ) CYSTOSCOPY WITH FULGERATION (N/A )  Patient location during evaluation: PACU Anesthesia Type: General Level of consciousness: awake and alert and oriented Pain management: pain level controlled Vital Signs Assessment: post-procedure vital signs reviewed and stable Respiratory status: spontaneous breathing Cardiovascular status: blood pressure returned to baseline Anesthetic complications: no     Last Vitals:  Vitals:   06/03/18 0902 06/03/18 0927  BP: (!) 147/83 (!) 168/76  Pulse: (!) 55 67  Resp: 14   Temp: (!) 36 C   SpO2: 92% 93%    Last Pain:  Vitals:   06/03/18 0902  TempSrc: Temporal  PainSc: 0-No pain                 Kensie Susman

## 2018-06-06 LAB — SURGICAL PATHOLOGY

## 2018-06-09 ENCOUNTER — Telehealth: Payer: Self-pay

## 2018-06-09 NOTE — Telephone Encounter (Signed)
-----   Message from Billey Co, MD sent at 06/09/2018 11:49 AM EST ----- Doristine Devoid news, no cancer seen in bladder biopsy! Keep follow up to discuss further, but wanted to tell them the good news  Nickolas Madrid, MD 06/09/2018

## 2018-06-09 NOTE — Telephone Encounter (Signed)
Called and advised patient that no cancer was seen in biopsy. Reminded patient of appt for fu. Patient verbalized understanding and confirmed appt.

## 2018-06-14 ENCOUNTER — Other Ambulatory Visit: Payer: Self-pay

## 2018-06-14 ENCOUNTER — Encounter: Payer: Self-pay | Admitting: Emergency Medicine

## 2018-06-14 ENCOUNTER — Telehealth: Payer: Self-pay | Admitting: Urology

## 2018-06-14 ENCOUNTER — Emergency Department
Admission: EM | Admit: 2018-06-14 | Discharge: 2018-06-14 | Disposition: A | Discharge status: Home / Self Care | Payer: Medicare HMO | Attending: Emergency Medicine | Admitting: Emergency Medicine

## 2018-06-14 DIAGNOSIS — I2510 Atherosclerotic heart disease of native coronary artery without angina pectoris: Secondary | ICD-10-CM | POA: Insufficient documentation

## 2018-06-14 DIAGNOSIS — Z8521 Personal history of malignant neoplasm of larynx: Secondary | ICD-10-CM | POA: Insufficient documentation

## 2018-06-14 DIAGNOSIS — Z79899 Other long term (current) drug therapy: Secondary | ICD-10-CM | POA: Diagnosis not present

## 2018-06-14 DIAGNOSIS — I252 Old myocardial infarction: Secondary | ICD-10-CM | POA: Insufficient documentation

## 2018-06-14 DIAGNOSIS — R319 Hematuria, unspecified: Secondary | ICD-10-CM

## 2018-06-14 DIAGNOSIS — Z87891 Personal history of nicotine dependence: Secondary | ICD-10-CM | POA: Insufficient documentation

## 2018-06-14 DIAGNOSIS — Z7982 Long term (current) use of aspirin: Secondary | ICD-10-CM | POA: Diagnosis not present

## 2018-06-14 LAB — URINALYSIS, COMPLETE (UACMP) WITH MICROSCOPIC
Bilirubin Urine: NEGATIVE
Glucose, UA: 50 mg/dL — AB
Ketones, ur: NEGATIVE mg/dL
Leukocytes,Ua: NEGATIVE
Nitrite: NEGATIVE
Protein, ur: 100 mg/dL — AB
RBC / HPF: >50 RBC/hpf — ABNORMAL HIGH (ref 0–5)
SPECIFIC GRAVITY, URINE: 1.006 (ref 1.005–1.030)
Squamous Epithelial / HPF: NONE SEEN (ref 0–5)
pH: 6 (ref 5.0–8.0)

## 2018-06-14 LAB — BASIC METABOLIC PANEL
Anion gap: 7 (ref 5–15)
BUN: 16 mg/dL (ref 8–23)
CO2: 26 mmol/L (ref 22–32)
Calcium: 9.4 mg/dL (ref 8.9–10.3)
Chloride: 105 mmol/L (ref 98–111)
Creatinine, Ser: 0.89 mg/dL (ref 0.61–1.24)
GFR calc Af Amer: >60 mL/min (ref >60)
GFR calc non Af Amer: >60 mL/min (ref >60)
GLUCOSE: 102 mg/dL — AB (ref 70–99)
Potassium: 3.6 mmol/L (ref 3.5–5.1)
Sodium: 138 mmol/L (ref 135–145)

## 2018-06-14 LAB — CBC
HEMATOCRIT: 41.6 % (ref 39.0–52.0)
Hemoglobin: 13.7 g/dL (ref 13.0–17.0)
MCH: 29.8 pg (ref 26.0–34.0)
MCHC: 32.9 g/dL (ref 30.0–36.0)
MCV: 90.6 fL (ref 80.0–100.0)
Platelets: 231 10*3/uL (ref 150–400)
RBC: 4.59 MIL/uL (ref 4.22–5.81)
RDW: 13.9 % (ref 11.5–15.5)
WBC: 7.4 10*3/uL (ref 4.0–10.5)
nRBC: 0 % (ref 0.0–0.2)

## 2018-06-14 NOTE — ED Provider Notes (Signed)
[facility] Emergency Department Provider Note  ____________________________________________   I have reviewed the triage vital signs and the nursing notes.   HISTORY  Chief Complaint Hematuria   History limited by: Not Limited   HPI Kevin Glover is a 78 y.o. male who presents to the emergency department today because of concern for hematuria. The patient has noticed some blood in his urine since undergoing a biopsy roughly 10 days ago. The patient came in today because he noticed some larger clots. Since arriving to the emergency department he does state that he has had a couple of clear micturitions. The patient denies any fevers. He has not had any nausea or vomiting.    Per medical record review patient has a history of cough, CAD, MI, recent cystoscopy and biopsy.   Past Medical History:  Diagnosis Date  . 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 Arthritis    shoulder, knees  . Coronary artery disease   . Cough    from throat irritation  . 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 Myocardial infarction ([place])    05/14/17  . Wears dentures    full upper and lower    Patient Active Problem List   Diagnosis Date Noted  . Bilateral lower extremity edema 06/08/2017  . Varicose veins of both lower extremities with pain 06/08/2017  . CAD in native artery 05/19/2017  . STEMI (ST elevation myocardial infarction) ([place]) 05/13/2017  . STEMI involving oth coronary artery of inferior wall ([place]) 05/12/2017  . Acquired hypothyroidism 12/25/2016  . Elevated PSA 12/25/2016  . 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 Hernia, inguinal, left 03/12/2014  . HLD (hyperlipidemia) 03/12/2014  . Actinic keratoses 09/06/2013    Past Surgical History:  Procedure Laterality Date  . BROW LIFT Bilateral 09/22/2016   Procedure: BLEPHAROPLASTY;  Surgeon: Karle Starch, MD;  Location: [place];  Service:  Ophthalmology;  Laterality: Bilateral;  . CATARACT EXTRACTION W/PHACO Right 06/02/2016   Procedure: CATARACT EXTRACTION PHACO AND INTRAOCULAR LENS PLACEMENT ([place]) right;  Surgeon: Eulogio Bear, MD;  Location: [place];  Service: Ophthalmology;  Laterality: Right;  . CATARACT EXTRACTION W/PHACO Left 06/30/2016   Procedure: CATARACT EXTRACTION PHACO AND INTRAOCULAR LENS PLACEMENT ([place])  Left;  Surgeon: Eulogio Bear, MD;  Location: [place];  Service: Ophthalmology;  Laterality: Left;  . CORONARY ANGIOPLASTY     1/19  . CORONARY/GRAFT ACUTE MI REVASCULARIZATION N/A 05/12/2017   Procedure: Coronary/Graft Acute MI Revascularization;  Surgeon: Isaias Cowman, MD;  Location: [place] [facility];  Service: Cardiovascular;  Laterality: N/A;  . CYSTOSCOPY WITH BIOPSY N/A 06/03/2018   Procedure: CYSTOSCOPY WITH bladder BIOPSY;  Surgeon: Billey Co, MD;  Location: [facility];  Service: Urology;  Laterality: N/A;  . CYSTOSCOPY WITH FULGERATION N/A 06/03/2018   Procedure: CYSTOSCOPY WITH FULGERATION;  Surgeon: Billey Co, MD;  Location: [facility];  Service: Urology;  Laterality: N/A;  . HERNIA REPAIR    . KNEE ARTHROSCOPY WITH MEDIAL MENISECTOMY Right 09/02/2017   Procedure: KNEE ARTHROSCOPY WITH PARTIAL MEDIAL AND LATERAL MENISECTOMY, PARTIAL SYNOVECTOMY;  Surgeon: Hessie Knows, MD;  Location: [facility];  Service: Orthopedics;  Laterality: Right;  . LEFT HEART CATH AND CORONARY ANGIOGRAPHY N/A 05/12/2017   Procedure: LEFT HEART CATH AND CORONARY ANGIOGRAPHY;  Surgeon: Isaias Cowman, MD;  Location: [place] [facility];  Service: Cardiovascular;  Laterality: N/A;  . MICROLARYNGOSCOPY  3-4 yrs ago  . MICROLARYNGOSCOPY Left 03/22/2015   Procedure: MICROLARYNGOSCOPY WITH EXCISION V CORD LEFT AND BIOPSY;  Surgeon: Beverly Gust, MD;  Location: [place];  Service: ENT;  Laterality: Left;  . PTOSIS REPAIR Bilateral 09/22/2016   Procedure: PTOSIS REPAIR;   Surgeon: Karle Starch, MD;  Location: [place];  Service: Ophthalmology;  Laterality: Bilateral;  . VEIN LIGATION Left     Prior to Admission medications   Medication Sig Start Date End Date Taking? Authorizing Provider  aspirin 81 MG chewable tablet Chew 1 tablet (81 mg total) by mouth daily. 05/15/17   Nicholes Mango, MD  atorvastatin (LIPITOR) 80 MG tablet Take 1 tablet (80 mg total) by mouth daily at 6 PM. 05/14/17   Gouru, Aruna, MD  doxycycline (PERIOSTAT) 20 MG tablet Take 20 mg by mouth 2 (two) times daily.  05/12/17   [provider]  levothyroxine (SYNTHROID, LEVOTHROID) 25 MCG tablet Take 25 mcg by mouth daily before breakfast.    [provider]  metoprolol tartrate (LOPRESSOR) 25 MG tablet Take 1 tablet (25 mg total) by mouth 2 (two) times daily. 05/14/17   Nicholes Mango, MD    Allergies Patient has no known allergies.  Family History  Problem Relation Age of Onset  . Cancer Mother        Melanoma  . Prolactinoma Neg Hx   . Prostate cancer Neg Hx   . Kidney cancer Neg Hx     Social History Social History   Tobacco Use  . Smoking status: Former Smoker    Types: Cigarettes    Last attempt to quit: 04/20/1988    Years since quitting: 30.1  . Smokeless tobacco: Never Used  . Tobacco comment: quit 1991  Substance Use Topics  . Alcohol use: Yes    Alcohol/week: 18.0 standard drinks    Types: 18 Cans of beer per week    Comment: 1 to 2 beers most days  . Drug use: No    Review of Systems Constitutional: No fever/chills Eyes: No visual changes. ENT: No sore throat. Cardiovascular: Denies chest pain. Respiratory: Denies shortness of breath. Gastrointestinal: No abdominal pain.  No nausea, no vomiting.  No diarrhea.   Genitourinary: Positive for hematuria.  Musculoskeletal: Negative for back pain. Skin: Negative for rash. Neurological: Negative for headaches, focal weakness or  numbness.  ____________________________________________   PHYSICAL EXAM:  VITAL SIGNS: ED Triage Vitals  Enc Vitals Group     BP 06/14/18 1812 (!) 152/88     Pulse Rate 06/14/18 1812 88     Resp 06/14/18 1812 20     Temp 06/14/18 1812 97.8 F (36.6 C)     Temp Source 06/14/18 1812 Oral     SpO2 06/14/18 1812 95 %     Weight 06/14/18 1812 189 lb (85.7 kg)     Height 06/14/18 1812 5\' 11"  (1.803 m)     Head Circumference --      Peak Flow --      Pain Score 06/14/18 1815 0   Constitutional: Alert and oriented.  Eyes: Conjunctivae are normal.  ENT      Head: Normocephalic and atraumatic.      Nose: No congestion/rhinnorhea.      Mouth/Throat: Mucous membranes are moist.      Neck: No stridor. Hematological/Lymphatic/Immunilogical: No cervical lymphadenopathy. Cardiovascular: Normal rate, regular rhythm.  No murmurs, rubs, or gallops. Respiratory: Normal respiratory effort without tachypnea nor retractions. Breath sounds are clear and equal bilaterally.  No wheezes/rales/rhonchi. Gastrointestinal: Soft and non tender. No rebound. No guarding.  Genitourinary: Deferred Musculoskeletal: Normal range of motion in all extremities. No lower extremity edema. Neurologic:  Normal speech and language. No gross focal neurologic deficits are appreciated.  Skin:  Skin is warm, dry and intact. No rash noted. Psychiatric: Mood and affect are normal. Speech and behavior are normal. Patient exhibits appropriate insight and judgment.  ____________________________________________    LABS (pertinent positives/negatives)  BMP wnl except glu 102 CBC wbc 7.4, hgb 13.7, plt 231 UA clear, moderate hgb dipstick, > 50 RBC, 11-20 WBC  ____________________________________________   EKG  None  ____________________________________________     RADIOLOGY  None  ____________________________________________   PROCEDURES  Procedures  ____________________________________________   INITIAL IMPRESSION / ASSESSMENT AND PLAN / ED COURSE  Pertinent labs & imaging results that were available during my care of the patient were reviewed by me and considered in my medical decision making (see chart for details).   Patient presented to the emergency department today because of concerns for hematuria.  He did recently have procedure with biopsies performed.  Some white blood cells although patient otherwise without any symptoms for infection.  This point I think the blood is likely secondary to procedure.  Did discuss urinary retention return precautions.  Encourage patient to follow-up with urology.  ____________________________________________   FINAL CLINICAL IMPRESSION(S) / ED DIAGNOSES  Final diagnoses:  Hematuria, unspecified type     Note: This dictation was prepared with Dragon dictation. Any transcriptional errors that result from this process are unintentional     Nance Pear, MD 06/14/18 2332

## 2018-06-14 NOTE — Telephone Encounter (Signed)
Called pt no answer, unable to leave message. Will call again.

## 2018-06-14 NOTE — Discharge Instructions (Addendum)
Please seek medical attention for any high fevers, chest pain, shortness of breath, change in behavior, persistent vomiting, bloody stool or any other new or concerning symptoms.  

## 2018-06-14 NOTE — ED Triage Notes (Signed)
Pt arrives with concerns after noticing blood/blood clots in urine yesterday and today. Pt states he had a bladder biopsy on 2/14. Pt denies any pain. Pt denies discomfort with urination.

## 2018-06-14 NOTE — Telephone Encounter (Signed)
Pt called states that he just started passing blood when he urinates over the past "couple days" He wants a call back please.

## 2018-06-16 DIAGNOSIS — C329 Malignant neoplasm of larynx, unspecified: Secondary | ICD-10-CM | POA: Diagnosis not present

## 2018-06-16 DIAGNOSIS — H903 Sensorineural hearing loss, bilateral: Secondary | ICD-10-CM | POA: Diagnosis not present

## 2018-06-16 LAB — URINE CULTURE: Culture: NO GROWTH

## 2018-06-16 NOTE — Telephone Encounter (Signed)
Patient was seen in ED for Hematuria.

## 2018-06-20 ENCOUNTER — Ambulatory Visit (INDEPENDENT_AMBULATORY_CARE_PROVIDER_SITE_OTHER): Payer: Medicare HMO | Admitting: Urology

## 2018-06-20 ENCOUNTER — Encounter: Payer: Self-pay | Admitting: Urology

## 2018-06-20 ENCOUNTER — Ambulatory Visit: Payer: Medicare HMO | Admitting: Urology

## 2018-06-20 VITALS — BP 144/84 | HR 96 | Ht 71.0 in | Wt 188.0 lb

## 2018-06-20 DIAGNOSIS — R3129 Other microscopic hematuria: Secondary | ICD-10-CM | POA: Diagnosis not present

## 2018-06-20 NOTE — Progress Notes (Signed)
   06/20/2018 2:05 PM   Kevin Glover 20-Jan-1941 [id number]  Reason for visit: Follow up cysto and bladder biopsy  HPI: I saw [name] back in urology clinic to discuss his pathology results.  Briefly, he is a 78 year old male who underwent microscopic hematuria work-up and was found to have multiple small erythematous lesions in the bladder.  He underwent uncomplicated biopsy and fulguration of these lesions on 06/03/2018, which showed benign urothelial tissue with epithelial ulceration, chronic inflammation, and hyperemia, but no evidence of atypia or malignancy.  He has had some intermittent hematuria since his procedure, but no clot retention.  He has continued to go hunting and be physically active.  I encouraged him to continue to push fluids, and reassured him that it is normal to have intermittent pink to bloody urine up to a few weeks after his bladder biopsy.  We reviewed his pathology, and he does not need any urologic follow up.  RTC PRN  A total of 15 minutes were spent face-to-face with the patient, greater than 50% was spent in patient education, counseling, and coordination of care regarding pathology results and postoperative expectations.   Billey Co, [facility] [address], [place], [place] [address] [phone]

## 2018-06-20 NOTE — Patient Instructions (Signed)
Drink plenty of fluids to keep the bladder flushed.  Call the clinic or present to the ED if you're unable to urinate.

## 2018-08-10 DIAGNOSIS — Z125 Encounter for screening for malignant neoplasm of prostate: Secondary | ICD-10-CM | POA: Diagnosis not present

## 2018-08-10 DIAGNOSIS — E039 Hypothyroidism, unspecified: Secondary | ICD-10-CM | POA: Diagnosis not present

## 2018-08-10 DIAGNOSIS — N342 Other urethritis: Secondary | ICD-10-CM | POA: Diagnosis not present

## 2018-08-10 DIAGNOSIS — R3129 Other microscopic hematuria: Secondary | ICD-10-CM | POA: Diagnosis not present

## 2018-08-10 DIAGNOSIS — C320 Malignant neoplasm of glottis: Secondary | ICD-10-CM | POA: Diagnosis not present

## 2018-08-10 DIAGNOSIS — I2510 Atherosclerotic heart disease of native coronary artery without angina pectoris: Secondary | ICD-10-CM | POA: Diagnosis not present

## 2018-08-10 DIAGNOSIS — R972 Elevated prostate specific antigen [PSA]: Secondary | ICD-10-CM | POA: Diagnosis not present

## 2018-08-24 DIAGNOSIS — E039 Hypothyroidism, unspecified: Secondary | ICD-10-CM | POA: Diagnosis not present

## 2018-08-24 DIAGNOSIS — Z0000 Encounter for general adult medical examination without abnormal findings: Secondary | ICD-10-CM | POA: Diagnosis not present

## 2018-08-24 DIAGNOSIS — I2510 Atherosclerotic heart disease of native coronary artery without angina pectoris: Secondary | ICD-10-CM | POA: Diagnosis not present

## 2018-08-24 DIAGNOSIS — E7800 Pure hypercholesterolemia, unspecified: Secondary | ICD-10-CM | POA: Diagnosis not present

## 2018-08-24 DIAGNOSIS — R972 Elevated prostate specific antigen [PSA]: Secondary | ICD-10-CM | POA: Diagnosis not present

## 2018-08-24 DIAGNOSIS — C320 Malignant neoplasm of glottis: Secondary | ICD-10-CM | POA: Diagnosis not present

## 2018-09-06 DIAGNOSIS — I2111 ST elevation (STEMI) myocardial infarction involving right coronary artery: Secondary | ICD-10-CM | POA: Diagnosis not present

## 2018-09-06 DIAGNOSIS — E7800 Pure hypercholesterolemia, unspecified: Secondary | ICD-10-CM | POA: Diagnosis not present

## 2018-09-06 DIAGNOSIS — I2510 Atherosclerotic heart disease of native coronary artery without angina pectoris: Secondary | ICD-10-CM | POA: Diagnosis not present

## 2018-12-15 DIAGNOSIS — C329 Malignant neoplasm of larynx, unspecified: Secondary | ICD-10-CM | POA: Diagnosis not present

## 2018-12-15 DIAGNOSIS — H903 Sensorineural hearing loss, bilateral: Secondary | ICD-10-CM | POA: Diagnosis not present

## 2019-01-03 DIAGNOSIS — I2111 ST elevation (STEMI) myocardial infarction involving right coronary artery: Secondary | ICD-10-CM | POA: Diagnosis not present

## 2019-01-03 DIAGNOSIS — E7800 Pure hypercholesterolemia, unspecified: Secondary | ICD-10-CM | POA: Diagnosis not present

## 2019-02-20 DIAGNOSIS — E039 Hypothyroidism, unspecified: Secondary | ICD-10-CM | POA: Diagnosis not present

## 2019-02-20 DIAGNOSIS — R972 Elevated prostate specific antigen [PSA]: Secondary | ICD-10-CM | POA: Diagnosis not present

## 2019-02-20 DIAGNOSIS — E7800 Pure hypercholesterolemia, unspecified: Secondary | ICD-10-CM | POA: Diagnosis not present

## 2019-02-20 DIAGNOSIS — C320 Malignant neoplasm of glottis: Secondary | ICD-10-CM | POA: Diagnosis not present

## 2019-02-20 DIAGNOSIS — Z0000 Encounter for general adult medical examination without abnormal findings: Secondary | ICD-10-CM | POA: Diagnosis not present

## 2019-02-20 DIAGNOSIS — I2510 Atherosclerotic heart disease of native coronary artery without angina pectoris: Secondary | ICD-10-CM | POA: Diagnosis not present

## 2019-02-27 DIAGNOSIS — R3129 Other microscopic hematuria: Secondary | ICD-10-CM | POA: Diagnosis not present

## 2019-02-27 DIAGNOSIS — E039 Hypothyroidism, unspecified: Secondary | ICD-10-CM | POA: Diagnosis not present

## 2019-02-27 DIAGNOSIS — I252 Old myocardial infarction: Secondary | ICD-10-CM | POA: Diagnosis not present

## 2019-02-27 DIAGNOSIS — I2510 Atherosclerotic heart disease of native coronary artery without angina pectoris: Secondary | ICD-10-CM | POA: Diagnosis not present

## 2019-02-27 DIAGNOSIS — R972 Elevated prostate specific antigen [PSA]: Secondary | ICD-10-CM | POA: Diagnosis not present

## 2019-02-27 DIAGNOSIS — M1711 Unilateral primary osteoarthritis, right knee: Secondary | ICD-10-CM | POA: Diagnosis not present

## 2019-02-27 DIAGNOSIS — Z23 Encounter for immunization: Secondary | ICD-10-CM | POA: Diagnosis not present

## 2019-02-27 DIAGNOSIS — Z8521 Personal history of malignant neoplasm of larynx: Secondary | ICD-10-CM | POA: Diagnosis not present

## 2019-02-27 DIAGNOSIS — L570 Actinic keratosis: Secondary | ICD-10-CM | POA: Diagnosis not present

## 2019-06-19 DIAGNOSIS — Z8521 Personal history of malignant neoplasm of larynx: Secondary | ICD-10-CM | POA: Diagnosis not present

## 2019-06-19 DIAGNOSIS — C329 Malignant neoplasm of larynx, unspecified: Secondary | ICD-10-CM | POA: Diagnosis not present

## 2019-06-19 DIAGNOSIS — H903 Sensorineural hearing loss, bilateral: Secondary | ICD-10-CM | POA: Diagnosis not present

## 2019-07-18 DIAGNOSIS — E7800 Pure hypercholesterolemia, unspecified: Secondary | ICD-10-CM | POA: Diagnosis not present

## 2019-07-18 DIAGNOSIS — I2510 Atherosclerotic heart disease of native coronary artery without angina pectoris: Secondary | ICD-10-CM | POA: Diagnosis not present

## 2019-07-18 DIAGNOSIS — I2111 ST elevation (STEMI) myocardial infarction involving right coronary artery: Secondary | ICD-10-CM | POA: Diagnosis not present

## 2019-08-18 DIAGNOSIS — C320 Malignant neoplasm of glottis: Secondary | ICD-10-CM | POA: Diagnosis not present

## 2019-08-18 DIAGNOSIS — R972 Elevated prostate specific antigen [PSA]: Secondary | ICD-10-CM | POA: Diagnosis not present

## 2019-08-18 DIAGNOSIS — I2510 Atherosclerotic heart disease of native coronary artery without angina pectoris: Secondary | ICD-10-CM | POA: Diagnosis not present

## 2019-08-18 DIAGNOSIS — Z125 Encounter for screening for malignant neoplasm of prostate: Secondary | ICD-10-CM | POA: Diagnosis not present

## 2019-08-18 DIAGNOSIS — L570 Actinic keratosis: Secondary | ICD-10-CM | POA: Diagnosis not present

## 2019-08-18 DIAGNOSIS — E7800 Pure hypercholesterolemia, unspecified: Secondary | ICD-10-CM | POA: Diagnosis not present

## 2019-08-21 DIAGNOSIS — Z03818 Encounter for observation for suspected exposure to other biological agents ruled out: Secondary | ICD-10-CM | POA: Diagnosis not present

## 2019-08-25 DIAGNOSIS — M25561 Pain in right knee: Secondary | ICD-10-CM | POA: Diagnosis not present

## 2019-08-25 DIAGNOSIS — L570 Actinic keratosis: Secondary | ICD-10-CM | POA: Diagnosis not present

## 2019-08-25 DIAGNOSIS — I2510 Atherosclerotic heart disease of native coronary artery without angina pectoris: Secondary | ICD-10-CM | POA: Diagnosis not present

## 2019-08-25 DIAGNOSIS — Z0000 Encounter for general adult medical examination without abnormal findings: Secondary | ICD-10-CM | POA: Diagnosis not present

## 2019-08-25 DIAGNOSIS — R3129 Other microscopic hematuria: Secondary | ICD-10-CM | POA: Diagnosis not present

## 2019-08-25 DIAGNOSIS — E039 Hypothyroidism, unspecified: Secondary | ICD-10-CM | POA: Diagnosis not present

## 2019-08-25 DIAGNOSIS — Z79899 Other long term (current) drug therapy: Secondary | ICD-10-CM | POA: Diagnosis not present

## 2019-08-25 DIAGNOSIS — Z8521 Personal history of malignant neoplasm of larynx: Secondary | ICD-10-CM | POA: Diagnosis not present

## 2019-08-25 DIAGNOSIS — R972 Elevated prostate specific antigen [PSA]: Secondary | ICD-10-CM | POA: Diagnosis not present

## 2020-01-18 DIAGNOSIS — I2510 Atherosclerotic heart disease of native coronary artery without angina pectoris: Secondary | ICD-10-CM | POA: Diagnosis not present

## 2020-01-18 DIAGNOSIS — I2111 ST elevation (STEMI) myocardial infarction involving right coronary artery: Secondary | ICD-10-CM | POA: Diagnosis not present

## 2020-01-18 DIAGNOSIS — E7800 Pure hypercholesterolemia, unspecified: Secondary | ICD-10-CM | POA: Diagnosis not present

## 2020-01-18 DIAGNOSIS — Z23 Encounter for immunization: Secondary | ICD-10-CM | POA: Diagnosis not present

## 2020-02-20 DIAGNOSIS — R3129 Other microscopic hematuria: Secondary | ICD-10-CM | POA: Diagnosis not present

## 2020-02-20 DIAGNOSIS — L570 Actinic keratosis: Secondary | ICD-10-CM | POA: Diagnosis not present

## 2020-02-20 DIAGNOSIS — E039 Hypothyroidism, unspecified: Secondary | ICD-10-CM | POA: Diagnosis not present

## 2020-02-20 DIAGNOSIS — I2510 Atherosclerotic heart disease of native coronary artery without angina pectoris: Secondary | ICD-10-CM | POA: Diagnosis not present

## 2020-02-20 DIAGNOSIS — R972 Elevated prostate specific antigen [PSA]: Secondary | ICD-10-CM | POA: Diagnosis not present

## 2020-02-26 DIAGNOSIS — Z79899 Other long term (current) drug therapy: Secondary | ICD-10-CM | POA: Diagnosis not present

## 2020-02-26 DIAGNOSIS — E039 Hypothyroidism, unspecified: Secondary | ICD-10-CM | POA: Diagnosis not present

## 2020-02-26 DIAGNOSIS — Z5181 Encounter for therapeutic drug level monitoring: Secondary | ICD-10-CM | POA: Diagnosis not present

## 2020-02-26 DIAGNOSIS — I2111 ST elevation (STEMI) myocardial infarction involving right coronary artery: Secondary | ICD-10-CM | POA: Diagnosis not present

## 2020-02-26 DIAGNOSIS — I2510 Atherosclerotic heart disease of native coronary artery without angina pectoris: Secondary | ICD-10-CM | POA: Diagnosis not present

## 2020-03-11 DIAGNOSIS — R490 Dysphonia: Secondary | ICD-10-CM | POA: Diagnosis not present

## 2020-03-11 DIAGNOSIS — H903 Sensorineural hearing loss, bilateral: Secondary | ICD-10-CM | POA: Diagnosis not present

## 2020-07-15 DIAGNOSIS — E7800 Pure hypercholesterolemia, unspecified: Secondary | ICD-10-CM | POA: Diagnosis not present

## 2020-07-15 DIAGNOSIS — I2510 Atherosclerotic heart disease of native coronary artery without angina pectoris: Secondary | ICD-10-CM | POA: Diagnosis not present

## 2020-07-15 DIAGNOSIS — I2111 ST elevation (STEMI) myocardial infarction involving right coronary artery: Secondary | ICD-10-CM | POA: Diagnosis not present

## 2020-08-19 DIAGNOSIS — R972 Elevated prostate specific antigen [PSA]: Secondary | ICD-10-CM | POA: Diagnosis not present

## 2020-08-19 DIAGNOSIS — E039 Hypothyroidism, unspecified: Secondary | ICD-10-CM | POA: Diagnosis not present

## 2020-08-19 DIAGNOSIS — I2111 ST elevation (STEMI) myocardial infarction involving right coronary artery: Secondary | ICD-10-CM | POA: Diagnosis not present

## 2020-08-19 DIAGNOSIS — R7309 Other abnormal glucose: Secondary | ICD-10-CM | POA: Diagnosis not present

## 2020-08-19 DIAGNOSIS — E7800 Pure hypercholesterolemia, unspecified: Secondary | ICD-10-CM | POA: Diagnosis not present

## 2020-08-19 DIAGNOSIS — I2510 Atherosclerotic heart disease of native coronary artery without angina pectoris: Secondary | ICD-10-CM | POA: Diagnosis not present

## 2020-08-26 DIAGNOSIS — Z0000 Encounter for general adult medical examination without abnormal findings: Secondary | ICD-10-CM | POA: Diagnosis not present

## 2020-08-26 DIAGNOSIS — Z8521 Personal history of malignant neoplasm of larynx: Secondary | ICD-10-CM | POA: Diagnosis not present

## 2020-08-26 DIAGNOSIS — E039 Hypothyroidism, unspecified: Secondary | ICD-10-CM | POA: Diagnosis not present

## 2020-08-26 DIAGNOSIS — Z79899 Other long term (current) drug therapy: Secondary | ICD-10-CM | POA: Diagnosis not present

## 2020-08-26 DIAGNOSIS — I2510 Atherosclerotic heart disease of native coronary artery without angina pectoris: Secondary | ICD-10-CM | POA: Diagnosis not present

## 2020-08-26 DIAGNOSIS — R972 Elevated prostate specific antigen [PSA]: Secondary | ICD-10-CM | POA: Diagnosis not present

## 2020-12-31 DIAGNOSIS — I2510 Atherosclerotic heart disease of native coronary artery without angina pectoris: Secondary | ICD-10-CM | POA: Diagnosis not present

## 2021-01-09 DIAGNOSIS — I2111 ST elevation (STEMI) myocardial infarction involving right coronary artery: Secondary | ICD-10-CM | POA: Diagnosis not present

## 2021-01-09 DIAGNOSIS — I2510 Atherosclerotic heart disease of native coronary artery without angina pectoris: Secondary | ICD-10-CM | POA: Diagnosis not present

## 2021-02-19 DIAGNOSIS — I2111 ST elevation (STEMI) myocardial infarction involving right coronary artery: Secondary | ICD-10-CM | POA: Diagnosis not present

## 2021-02-19 DIAGNOSIS — R7309 Other abnormal glucose: Secondary | ICD-10-CM | POA: Diagnosis not present

## 2021-02-19 DIAGNOSIS — R972 Elevated prostate specific antigen [PSA]: Secondary | ICD-10-CM | POA: Diagnosis not present

## 2021-02-19 DIAGNOSIS — I2510 Atherosclerotic heart disease of native coronary artery without angina pectoris: Secondary | ICD-10-CM | POA: Diagnosis not present

## 2021-02-19 DIAGNOSIS — R0609 Other forms of dyspnea: Secondary | ICD-10-CM | POA: Diagnosis not present

## 2021-02-19 DIAGNOSIS — E039 Hypothyroidism, unspecified: Secondary | ICD-10-CM | POA: Diagnosis not present

## 2021-02-19 DIAGNOSIS — C320 Malignant neoplasm of glottis: Secondary | ICD-10-CM | POA: Diagnosis not present

## 2021-02-19 DIAGNOSIS — E7800 Pure hypercholesterolemia, unspecified: Secondary | ICD-10-CM | POA: Diagnosis not present

## 2021-02-26 DIAGNOSIS — I2510 Atherosclerotic heart disease of native coronary artery without angina pectoris: Secondary | ICD-10-CM | POA: Diagnosis not present

## 2021-02-26 DIAGNOSIS — E039 Hypothyroidism, unspecified: Secondary | ICD-10-CM | POA: Diagnosis not present

## 2021-02-26 DIAGNOSIS — R0609 Other forms of dyspnea: Secondary | ICD-10-CM | POA: Diagnosis not present

## 2021-02-26 DIAGNOSIS — I252 Old myocardial infarction: Secondary | ICD-10-CM | POA: Diagnosis not present

## 2021-02-26 DIAGNOSIS — R972 Elevated prostate specific antigen [PSA]: Secondary | ICD-10-CM | POA: Diagnosis not present

## 2021-02-26 DIAGNOSIS — C320 Malignant neoplasm of glottis: Secondary | ICD-10-CM | POA: Diagnosis not present

## 2021-04-22 DIAGNOSIS — Z20822 Contact with and (suspected) exposure to covid-19: Secondary | ICD-10-CM | POA: Diagnosis not present

## 2021-04-22 DIAGNOSIS — Z03818 Encounter for observation for suspected exposure to other biological agents ruled out: Secondary | ICD-10-CM | POA: Diagnosis not present

## 2021-04-23 DIAGNOSIS — Z7189 Other specified counseling: Secondary | ICD-10-CM | POA: Diagnosis not present

## 2021-04-23 DIAGNOSIS — U071 COVID-19: Secondary | ICD-10-CM | POA: Diagnosis not present

## 2021-06-03 ENCOUNTER — Other Ambulatory Visit: Payer: Self-pay | Admitting: Unknown Physician Specialty

## 2021-06-03 ENCOUNTER — Ambulatory Visit
Admission: RE | Admit: 2021-06-03 | Discharge: 2021-06-03 | Disposition: A | Discharge status: Home / Self Care | Payer: Medicare HMO | Source: Ambulatory Visit | Attending: Unknown Physician Specialty | Admitting: Unknown Physician Specialty

## 2021-06-03 DIAGNOSIS — H903 Sensorineural hearing loss, bilateral: Secondary | ICD-10-CM | POA: Diagnosis not present

## 2021-06-03 DIAGNOSIS — R059 Cough, unspecified: Secondary | ICD-10-CM

## 2021-06-24 DIAGNOSIS — I2111 ST elevation (STEMI) myocardial infarction involving right coronary artery: Secondary | ICD-10-CM | POA: Diagnosis not present

## 2021-06-24 DIAGNOSIS — I2510 Atherosclerotic heart disease of native coronary artery without angina pectoris: Secondary | ICD-10-CM | POA: Diagnosis not present

## 2021-06-24 DIAGNOSIS — R0609 Other forms of dyspnea: Secondary | ICD-10-CM | POA: Diagnosis not present

## 2021-07-01 ENCOUNTER — Other Ambulatory Visit: Payer: Self-pay

## 2021-07-01 ENCOUNTER — Other Ambulatory Visit: Payer: Self-pay | Admitting: Unknown Physician Specialty

## 2021-07-01 ENCOUNTER — Ambulatory Visit
Admission: RE | Admit: 2021-07-01 | Discharge: 2021-07-01 | Disposition: A | Discharge status: Home / Self Care | Payer: Medicare HMO | Source: Ambulatory Visit | Attending: Unknown Physician Specialty | Admitting: Unknown Physician Specialty

## 2021-07-01 DIAGNOSIS — R059 Cough, unspecified: Secondary | ICD-10-CM

## 2021-07-21 DIAGNOSIS — R0602 Shortness of breath: Secondary | ICD-10-CM | POA: Diagnosis not present

## 2021-07-21 DIAGNOSIS — J42 Unspecified chronic bronchitis: Secondary | ICD-10-CM | POA: Diagnosis not present

## 2021-07-22 ENCOUNTER — Other Ambulatory Visit: Payer: Self-pay | Admitting: Pulmonary Disease

## 2021-07-22 DIAGNOSIS — R0602 Shortness of breath: Secondary | ICD-10-CM

## 2021-07-22 DIAGNOSIS — J42 Unspecified chronic bronchitis: Secondary | ICD-10-CM

## 2021-08-04 ENCOUNTER — Ambulatory Visit
Admission: RE | Admit: 2021-08-04 | Discharge: 2021-08-04 | Disposition: A | Discharge status: Home / Self Care | Payer: Medicare HMO | Source: Ambulatory Visit | Attending: Pulmonary Disease | Admitting: Pulmonary Disease

## 2021-08-04 DIAGNOSIS — R0602 Shortness of breath: Secondary | ICD-10-CM | POA: Diagnosis not present

## 2021-08-04 DIAGNOSIS — J42 Unspecified chronic bronchitis: Secondary | ICD-10-CM | POA: Diagnosis not present

## 2021-08-04 DIAGNOSIS — J479 Bronchiectasis, uncomplicated: Secondary | ICD-10-CM | POA: Diagnosis not present

## 2021-08-04 DIAGNOSIS — R911 Solitary pulmonary nodule: Secondary | ICD-10-CM | POA: Diagnosis not present

## 2021-08-04 LAB — POCT I-STAT CREATININE: Creatinine, Ser: 1.2 mg/dL (ref 0.61–1.24)

## 2021-08-04 MED ORDER — IOHEXOL 300 MG/ML  SOLN
75.0000 mL | Freq: Once | INTRAMUSCULAR | Status: AC | PRN
Start: 2021-08-04 — End: 2021-08-04
  Administered 2021-08-04: 75 mL via INTRAVENOUS

## 2021-08-26 DIAGNOSIS — R7309 Other abnormal glucose: Secondary | ICD-10-CM | POA: Diagnosis not present

## 2021-08-26 DIAGNOSIS — I2510 Atherosclerotic heart disease of native coronary artery without angina pectoris: Secondary | ICD-10-CM | POA: Diagnosis not present

## 2021-08-26 DIAGNOSIS — E039 Hypothyroidism, unspecified: Secondary | ICD-10-CM | POA: Diagnosis not present

## 2021-08-26 DIAGNOSIS — C320 Malignant neoplasm of glottis: Secondary | ICD-10-CM | POA: Diagnosis not present

## 2021-08-26 DIAGNOSIS — R0609 Other forms of dyspnea: Secondary | ICD-10-CM | POA: Diagnosis not present

## 2021-08-26 DIAGNOSIS — I2111 ST elevation (STEMI) myocardial infarction involving right coronary artery: Secondary | ICD-10-CM | POA: Diagnosis not present

## 2021-08-26 DIAGNOSIS — R972 Elevated prostate specific antigen [PSA]: Secondary | ICD-10-CM | POA: Diagnosis not present

## 2021-09-02 DIAGNOSIS — Z1389 Encounter for screening for other disorder: Secondary | ICD-10-CM | POA: Diagnosis not present

## 2021-09-02 DIAGNOSIS — Z0000 Encounter for general adult medical examination without abnormal findings: Secondary | ICD-10-CM | POA: Diagnosis not present

## 2021-09-02 DIAGNOSIS — E039 Hypothyroidism, unspecified: Secondary | ICD-10-CM | POA: Diagnosis not present

## 2021-09-02 DIAGNOSIS — C320 Malignant neoplasm of glottis: Secondary | ICD-10-CM | POA: Diagnosis not present

## 2021-09-02 DIAGNOSIS — R972 Elevated prostate specific antigen [PSA]: Secondary | ICD-10-CM | POA: Diagnosis not present

## 2021-09-02 DIAGNOSIS — I2510 Atherosclerotic heart disease of native coronary artery without angina pectoris: Secondary | ICD-10-CM | POA: Diagnosis not present

## 2021-09-23 DIAGNOSIS — J019 Acute sinusitis, unspecified: Secondary | ICD-10-CM | POA: Diagnosis not present

## 2021-10-20 DIAGNOSIS — J449 Chronic obstructive pulmonary disease, unspecified: Secondary | ICD-10-CM | POA: Diagnosis not present

## 2022-01-01 DIAGNOSIS — R0609 Other forms of dyspnea: Secondary | ICD-10-CM | POA: Diagnosis not present

## 2022-01-01 DIAGNOSIS — I2111 ST elevation (STEMI) myocardial infarction involving right coronary artery: Secondary | ICD-10-CM | POA: Diagnosis not present

## 2022-01-01 DIAGNOSIS — I2510 Atherosclerotic heart disease of native coronary artery without angina pectoris: Secondary | ICD-10-CM | POA: Diagnosis not present

## 2022-01-01 DIAGNOSIS — E7800 Pure hypercholesterolemia, unspecified: Secondary | ICD-10-CM | POA: Diagnosis not present

## 2022-01-01 DIAGNOSIS — Z23 Encounter for immunization: Secondary | ICD-10-CM | POA: Diagnosis not present

## 2022-02-26 DIAGNOSIS — E7800 Pure hypercholesterolemia, unspecified: Secondary | ICD-10-CM | POA: Diagnosis not present

## 2022-02-26 DIAGNOSIS — E039 Hypothyroidism, unspecified: Secondary | ICD-10-CM | POA: Diagnosis not present

## 2022-02-26 DIAGNOSIS — I2510 Atherosclerotic heart disease of native coronary artery without angina pectoris: Secondary | ICD-10-CM | POA: Diagnosis not present

## 2022-02-26 DIAGNOSIS — C320 Malignant neoplasm of glottis: Secondary | ICD-10-CM | POA: Diagnosis not present

## 2022-02-26 DIAGNOSIS — R972 Elevated prostate specific antigen [PSA]: Secondary | ICD-10-CM | POA: Diagnosis not present

## 2022-02-26 DIAGNOSIS — Z0000 Encounter for general adult medical examination without abnormal findings: Secondary | ICD-10-CM | POA: Diagnosis not present

## 2022-02-26 DIAGNOSIS — R7309 Other abnormal glucose: Secondary | ICD-10-CM | POA: Diagnosis not present

## 2022-02-26 DIAGNOSIS — R8290 Unspecified abnormal findings in urine: Secondary | ICD-10-CM | POA: Diagnosis not present

## 2022-03-05 DIAGNOSIS — E039 Hypothyroidism, unspecified: Secondary | ICD-10-CM | POA: Diagnosis not present

## 2022-03-05 DIAGNOSIS — C320 Malignant neoplasm of glottis: Secondary | ICD-10-CM | POA: Diagnosis not present

## 2022-03-05 DIAGNOSIS — R7309 Other abnormal glucose: Secondary | ICD-10-CM | POA: Diagnosis not present

## 2022-03-05 DIAGNOSIS — I2510 Atherosclerotic heart disease of native coronary artery without angina pectoris: Secondary | ICD-10-CM | POA: Diagnosis not present

## 2022-09-11 DIAGNOSIS — E039 Hypothyroidism, unspecified: Secondary | ICD-10-CM | POA: Diagnosis not present

## 2022-09-11 DIAGNOSIS — R972 Elevated prostate specific antigen [PSA]: Secondary | ICD-10-CM | POA: Diagnosis not present

## 2022-09-11 DIAGNOSIS — R7309 Other abnormal glucose: Secondary | ICD-10-CM | POA: Diagnosis not present

## 2022-09-11 DIAGNOSIS — C320 Malignant neoplasm of glottis: Secondary | ICD-10-CM | POA: Diagnosis not present

## 2022-09-11 DIAGNOSIS — I2510 Atherosclerotic heart disease of native coronary artery without angina pectoris: Secondary | ICD-10-CM | POA: Diagnosis not present

## 2022-09-11 DIAGNOSIS — R8290 Unspecified abnormal findings in urine: Secondary | ICD-10-CM | POA: Diagnosis not present

## 2022-09-11 DIAGNOSIS — Z125 Encounter for screening for malignant neoplasm of prostate: Secondary | ICD-10-CM | POA: Diagnosis not present

## 2022-09-11 DIAGNOSIS — E7800 Pure hypercholesterolemia, unspecified: Secondary | ICD-10-CM | POA: Diagnosis not present

## 2022-09-18 DIAGNOSIS — Z1331 Encounter for screening for depression: Secondary | ICD-10-CM | POA: Diagnosis not present

## 2022-09-18 DIAGNOSIS — R972 Elevated prostate specific antigen [PSA]: Secondary | ICD-10-CM | POA: Diagnosis not present

## 2022-09-18 DIAGNOSIS — E039 Hypothyroidism, unspecified: Secondary | ICD-10-CM | POA: Diagnosis not present

## 2022-09-18 DIAGNOSIS — Z0000 Encounter for general adult medical examination without abnormal findings: Secondary | ICD-10-CM | POA: Diagnosis not present

## 2022-09-18 DIAGNOSIS — I2510 Atherosclerotic heart disease of native coronary artery without angina pectoris: Secondary | ICD-10-CM | POA: Diagnosis not present

## 2022-11-18 DIAGNOSIS — R0609 Other forms of dyspnea: Secondary | ICD-10-CM | POA: Diagnosis not present

## 2022-11-18 DIAGNOSIS — I2510 Atherosclerotic heart disease of native coronary artery without angina pectoris: Secondary | ICD-10-CM | POA: Diagnosis not present

## 2022-11-18 DIAGNOSIS — I2111 ST elevation (STEMI) myocardial infarction involving right coronary artery: Secondary | ICD-10-CM | POA: Diagnosis not present

## 2022-11-18 DIAGNOSIS — E7800 Pure hypercholesterolemia, unspecified: Secondary | ICD-10-CM | POA: Diagnosis not present

## 2022-12-17 DIAGNOSIS — J019 Acute sinusitis, unspecified: Secondary | ICD-10-CM | POA: Diagnosis not present

## 2022-12-17 DIAGNOSIS — R059 Cough, unspecified: Secondary | ICD-10-CM | POA: Diagnosis not present

## 2022-12-17 DIAGNOSIS — J3489 Other specified disorders of nose and nasal sinuses: Secondary | ICD-10-CM | POA: Diagnosis not present

## 2022-12-17 DIAGNOSIS — R053 Chronic cough: Secondary | ICD-10-CM | POA: Diagnosis not present

## 2022-12-17 DIAGNOSIS — R42 Dizziness and giddiness: Secondary | ICD-10-CM | POA: Diagnosis not present

## 2022-12-17 DIAGNOSIS — E039 Hypothyroidism, unspecified: Secondary | ICD-10-CM | POA: Diagnosis not present

## 2022-12-17 DIAGNOSIS — I2510 Atherosclerotic heart disease of native coronary artery without angina pectoris: Secondary | ICD-10-CM | POA: Diagnosis not present

## 2022-12-17 DIAGNOSIS — R0981 Nasal congestion: Secondary | ICD-10-CM | POA: Diagnosis not present

## 2023-01-08 DIAGNOSIS — J019 Acute sinusitis, unspecified: Secondary | ICD-10-CM | POA: Diagnosis not present

## 2023-03-26 DIAGNOSIS — J209 Acute bronchitis, unspecified: Secondary | ICD-10-CM | POA: Diagnosis not present

## 2023-03-26 DIAGNOSIS — J42 Unspecified chronic bronchitis: Secondary | ICD-10-CM | POA: Diagnosis not present

## 2023-05-04 DIAGNOSIS — I2510 Atherosclerotic heart disease of native coronary artery without angina pectoris: Secondary | ICD-10-CM | POA: Diagnosis not present

## 2023-05-04 DIAGNOSIS — J449 Chronic obstructive pulmonary disease, unspecified: Secondary | ICD-10-CM | POA: Diagnosis not present

## 2023-05-04 DIAGNOSIS — C320 Malignant neoplasm of glottis: Secondary | ICD-10-CM | POA: Diagnosis not present

## 2023-05-04 DIAGNOSIS — E039 Hypothyroidism, unspecified: Secondary | ICD-10-CM | POA: Diagnosis not present

## 2023-05-04 DIAGNOSIS — R972 Elevated prostate specific antigen [PSA]: Secondary | ICD-10-CM | POA: Diagnosis not present

## 2023-05-04 DIAGNOSIS — R7309 Other abnormal glucose: Secondary | ICD-10-CM | POA: Diagnosis not present

## 2023-05-11 DIAGNOSIS — R972 Elevated prostate specific antigen [PSA]: Secondary | ICD-10-CM | POA: Diagnosis not present

## 2023-05-11 DIAGNOSIS — Z0000 Encounter for general adult medical examination without abnormal findings: Secondary | ICD-10-CM | POA: Diagnosis not present

## 2023-05-11 DIAGNOSIS — Z87891 Personal history of nicotine dependence: Secondary | ICD-10-CM | POA: Diagnosis not present

## 2023-05-11 DIAGNOSIS — R7309 Other abnormal glucose: Secondary | ICD-10-CM | POA: Diagnosis not present

## 2023-05-11 DIAGNOSIS — E039 Hypothyroidism, unspecified: Secondary | ICD-10-CM | POA: Diagnosis not present

## 2023-05-11 DIAGNOSIS — R3129 Other microscopic hematuria: Secondary | ICD-10-CM | POA: Diagnosis not present

## 2023-05-11 DIAGNOSIS — I2510 Atherosclerotic heart disease of native coronary artery without angina pectoris: Secondary | ICD-10-CM | POA: Diagnosis not present

## 2023-05-19 ENCOUNTER — Encounter: Payer: Self-pay | Admitting: Internal Medicine

## 2023-05-19 ENCOUNTER — Other Ambulatory Visit: Payer: Self-pay | Admitting: Internal Medicine

## 2023-05-19 DIAGNOSIS — R972 Elevated prostate specific antigen [PSA]: Secondary | ICD-10-CM

## 2023-05-19 DIAGNOSIS — R319 Hematuria, unspecified: Secondary | ICD-10-CM

## 2023-05-20 DIAGNOSIS — R0609 Other forms of dyspnea: Secondary | ICD-10-CM | POA: Diagnosis not present

## 2023-05-20 DIAGNOSIS — I2111 ST elevation (STEMI) myocardial infarction involving right coronary artery: Secondary | ICD-10-CM | POA: Diagnosis not present

## 2023-05-20 DIAGNOSIS — E7800 Pure hypercholesterolemia, unspecified: Secondary | ICD-10-CM | POA: Diagnosis not present

## 2023-05-26 ENCOUNTER — Ambulatory Visit
Admission: RE | Admit: 2023-05-26 | Discharge: 2023-05-26 | Disposition: A | Discharge status: Home / Self Care | Payer: Medicare HMO | Source: Ambulatory Visit | Attending: Internal Medicine | Admitting: Internal Medicine

## 2023-05-26 DIAGNOSIS — R319 Hematuria, unspecified: Secondary | ICD-10-CM | POA: Insufficient documentation

## 2023-05-26 DIAGNOSIS — R972 Elevated prostate specific antigen [PSA]: Secondary | ICD-10-CM | POA: Insufficient documentation

## 2023-05-26 DIAGNOSIS — C679 Malignant neoplasm of bladder, unspecified: Secondary | ICD-10-CM

## 2023-05-26 DIAGNOSIS — N1330 Unspecified hydronephrosis: Secondary | ICD-10-CM | POA: Diagnosis not present

## 2023-05-26 DIAGNOSIS — K7689 Other specified diseases of liver: Secondary | ICD-10-CM | POA: Diagnosis not present

## 2023-05-26 DIAGNOSIS — R310 Gross hematuria: Secondary | ICD-10-CM | POA: Diagnosis not present

## 2023-05-26 DIAGNOSIS — N401 Enlarged prostate with lower urinary tract symptoms: Secondary | ICD-10-CM | POA: Diagnosis not present

## 2023-05-26 HISTORY — DX: Malignant neoplasm of bladder, unspecified: C67.9

## 2023-05-26 MED ORDER — IOHEXOL 300 MG/ML  SOLN
125.0000 mL | Freq: Once | INTRAMUSCULAR | Status: AC | PRN
  Administered 2023-05-26: 125 mL via INTRAVENOUS

## 2023-05-26 MED ORDER — SODIUM CHLORIDE 0.9 % IV BOLUS
250.0000 mL | Freq: Once | INTRAVENOUS | Status: AC
  Administered 2023-05-26: 250 mL via INTRAVENOUS

## 2023-06-09 ENCOUNTER — Ambulatory Visit: Payer: Self-pay | Admitting: Urology

## 2023-06-23 ENCOUNTER — Ambulatory Visit: Payer: Medicare HMO | Admitting: Urology

## 2023-06-23 VITALS — BP 170/84 | HR 80 | Ht 71.0 in | Wt 185.0 lb

## 2023-06-23 DIAGNOSIS — N1330 Unspecified hydronephrosis: Secondary | ICD-10-CM | POA: Diagnosis not present

## 2023-06-23 DIAGNOSIS — Z01818 Encounter for other preprocedural examination: Secondary | ICD-10-CM

## 2023-06-23 DIAGNOSIS — R3129 Other microscopic hematuria: Secondary | ICD-10-CM

## 2023-06-23 DIAGNOSIS — R972 Elevated prostate specific antigen [PSA]: Secondary | ICD-10-CM | POA: Diagnosis not present

## 2023-06-23 DIAGNOSIS — N3289 Other specified disorders of bladder: Secondary | ICD-10-CM | POA: Diagnosis not present

## 2023-06-23 LAB — URINALYSIS, COMPLETE
Bilirubin, UA: NEGATIVE
Glucose, UA: NEGATIVE
Ketones, UA: NEGATIVE
Leukocytes,UA: NEGATIVE
Nitrite, UA: NEGATIVE
Protein,UA: NEGATIVE
Specific Gravity, UA: 1.01 (ref 1.005–1.030)
Urobilinogen, Ur: 0.2 mg/dL (ref 0.2–1.0)
pH, UA: 5.5 (ref 5.0–7.5)

## 2023-06-23 LAB — MICROSCOPIC EXAMINATION: Bacteria, UA: NONE SEEN

## 2023-06-23 NOTE — Patient Instructions (Signed)
 Transurethral Resection of Bladder Tumor  Transurethral resection of a bladder tumor is the removal (resection) of cancerous tissue (tumor) from the inside wall of the bladder. The bladder is the organ that holds urine. The tumor is removed through the tube that carries urine out of the body (urethra). In a transurethral resection, a thin telescope with a light, a tiny camera, and an electric cutting edge (resectoscope) is passed through the urethra. In men, the opening of the urethra is at the end of the penis. In women, it is just above the opening of the vagina. Tell a health care provider about: Any allergies you have. All medicines you are taking, including vitamins, herbs, eye drops, creams, and over-the-counter medicines. Any problems you or family members have had with anesthetic medicines. Any bleeding problems you have. Any surgeries you have had. Any medical conditions you have, including recent urinary tract infections. Whether you are pregnant or may be pregnant. What are the risks? Generally, this is a safe procedure. However, problems may occur, including: Infection. Bleeding. Allergic reactions to medicines. Damage to nearby structures or organs. Difficulty urinating from blockage of the urethra or not being able to urinate (urinary retention). Deep vein thrombosis. This is a blood clot that can develop in your leg. Recurring cancer. What happens before the procedure? When to stop eating and drinking Follow instructions from your health care provider about what you may eat and drink before your procedure. These may include: 8 hours before your procedure Stop eating most foods. Do not eat meat, fried foods, or fatty foods. Eat only light foods, such as toast or crackers. All liquids are okay except energy drinks and alcohol. 6 hours before your procedure Stop eating. Drink only clear liquids, such as water, clear fruit juice, black coffee, plain tea, and sports  drinks. Do not drink energy drinks or alcohol. 2 hours before your procedure Stop drinking all liquids. You may be allowed to take medicines with small sips of water. Medicines Ask your health care provider about: Changing or stopping your regular medicines. This is especially important if you are taking diabetes medicines or blood thinners. Taking medicines such as aspirin and ibuprofen. These medicines can thin your blood. Do not take these medicines unless your health care provider tells you to take them. Taking over-the-counter medicines, vitamins, herbs, and supplements. General instructions If you will be going home right after the procedure, plan to have a responsible adult: Take you home from the hospital or clinic. You will not be allowed to drive. Care for you for the time you are told. Ask your health care provider what steps will be taken to help prevent infection. These steps may include: Washing skin with a germ-killing soap. Taking antibiotic medicine. Do not use any products that contain nicotine or tobacco for at least 4 weeks before the procedure. These products include cigarettes, chewing tobacco, and vaping devices, such as e-cigarettes. If you need help quitting, ask your health care provider. What happens during the procedure? An IV will be inserted into one of your veins. You will be given one or more of the following: A medicine to help you relax (sedative). A medicine that is injected into your spine to numb the area below and slightly above the injection site (spinal anesthetic). A medicine that is injected into an area of your body to numb everything below the injection site (regional anesthetic). A medicine to make you fall asleep (general anesthetic). Your legs will be placed in foot rests (  stirrups) to open your legs and bend your knees. The resectoscope will be passed through your urethra and into your bladder. The part of your bladder with the tumor will be  resected by the cutting edge of the resectoscope. Fluid will be passed to rinse out the cut tissues (irrigation). The resectoscope will then be taken out. A small, thin tube (catheter) will be passed through your urethra and into your bladder. The catheter will drain urine into a bag outside of your body. The procedure may vary among health care providers and hospitals. What happens after the procedure? Your blood pressure, heart rate, breathing rate, and blood oxygen level will be monitored until you leave the hospital or clinic. You may continue to receive fluids and medicines through an IV. You will be given pain medicine to relieve pain. You will have a catheter to drain your urine. The amount of urine will be measured. If you have blood in your urine, your bladder may be rinsed out by passing fluid through your catheter. You will be encouraged to walk as soon as you can. You may have to wear compression stockings. These stockings help to prevent blood clots and reduce swelling in your legs. If you were given a sedative during the procedure, it can affect you for several hours. Do not drive or operate machinery until your health care provider says that it is safe. Summary Transurethral resection of a bladder tumor is the removal (resection) of a cancerous growth (tumor) on the inside wall of the bladder. To do this procedure, your health care provider uses a thin telescope with a light, a tiny camera, and an electric cutting edge (resectoscope) that is guided to your bladder through your urethra. The part of your bladder that is affected by the tumor will be resected by the cutting edge of the resectoscope. A catheter will be passed through your urethra and into your bladder. The catheter will drain urine into a bag outside of your body. If you will be going home right after the procedure, plan to have a responsible adult take you home from the hospital or clinic. You will not be allowed to  drive. This information is not intended to replace advice given to you by your health care provider. Make sure you discuss any questions you have with your health care provider. Document Revised: 04/11/2021 Document Reviewed: 04/11/2021 Elsevier Patient Education  2024 [employer].

## 2023-06-23 NOTE — Progress Notes (Signed)
 [name],acting as a Neurosurgeon for [name], MD.,have documented all relevant documentation on the behalf of [name], MD,as directed by  [name], MD while in the presence of [name], MD.  06/23/23 11:07 AM   Kevin Glover 03-01-1941 [id number]  Referring provider: Barbette Reichmann, MD [address] [facility] [place],  [place] [address]  Chief Complaint  Patient presents with   Establish Care   Elevated PSA   Hematuria    HPI: 83 y/o male presents to establish care for hematuria and elevated PSA.   He has a history of microscopic hematuria evaluation and was previously a patient of Dr. Richardo Hanks. He underwent biopsy and surgery for multiple small erythematous lesions in the bladder, which showed epithelial ulceration, chronic inflammation, and hyperemia, but no evidence of atypia or malignancy.   More recently, he underwent a hematuria evaluation.  CT scan of the abdomen and pelvis was personally reviewed by me and showed mild left hydronephrosis to the level of the bladder and plaque-like thickening along the left lateral posterior and inferior bladder walls, involving the left UVJ, highly suspicious for malignancy. Prostatomegaly was also noted.   He reports mild symptoms, including occasional leakage after urination and nocturia, which he attributes to fluid intake before bed. He denies significant pain but mentions occasional discomfort under his belt, which he associates with certain activities or positions.   He is a former smoker, having smoked two packs a day for thirty years before quitting in the early nineties.  He has a significant cardiac history including coronary artery disease and history of M.I. He underwent a revascularization procedure in 2019. He is not on any blood thinners other than a baby aspirin.  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   Myocardial infarction (HCC)    05/14/17   Wears dentures    full upper and lower    Surgical History: Past Surgical History:  Procedure Laterality Date   BROW LIFT Bilateral 09/22/2016   Procedure: BLEPHAROPLASTY;  Surgeon: Imagene Riches, MD;  Location: [facility];  Service: Ophthalmology;  Laterality: Bilateral;   CATARACT EXTRACTION W/PHACO Right 06/02/2016   Procedure: CATARACT EXTRACTION PHACO AND INTRAOCULAR LENS PLACEMENT (IOC) right;  Surgeon: Nevada Crane, MD;  Location: [facility];  Service: Ophthalmology;  Laterality: Right;   CATARACT EXTRACTION W/PHACO Left 06/30/2016   Procedure: CATARACT EXTRACTION PHACO AND INTRAOCULAR LENS PLACEMENT (IOC)  Left;  Surgeon: Nevada Crane, MD;  Location: [facility];  Service: Ophthalmology;  Laterality: Left;   CORONARY ANGIOPLASTY     1/19   CORONARY/GRAFT ACUTE MI REVASCULARIZATION N/A 05/12/2017   Procedure: Coronary/Graft Acute MI Revascularization;  Surgeon: Marcina Millard, MD;  Location: [facility];  Service: Cardiovascular;  Laterality: N/A;   CYSTOSCOPY WITH BIOPSY N/A 06/03/2018   Procedure: CYSTOSCOPY WITH bladder BIOPSY;  Surgeon: Sondra Come, MD;  Location: [facility] OR[facility];  Service: Urology;  Laterality: N/A;   CYSTOSCOPY WITH FULGERATION N/A 06/03/2018   Procedure: CYSTOSCOPY WITH FULGERATION;  Surgeon: Sondra Come, MD;  Location: [facility] OR[facility];  Service: Urology;  Laterality: N/A;   HERNIA REPAIR     KNEE ARTHROSCOPY WITH MEDIAL MENISECTOMY Right 09/02/2017  Procedure: KNEE ARTHROSCOPY WITH PARTIAL MEDIAL AND LATERAL MENISECTOMY, PARTIAL SYNOVECTOMY;  Surgeon: Kennedy Bucker, MD;  Location: [facility] OR[facility];  Service: Orthopedics;  Laterality: Right;   LEFT HEART CATH AND CORONARY ANGIOGRAPHY N/A 05/12/2017   Procedure: LEFT HEART CATH AND  CORONARY ANGIOGRAPHY;  Surgeon: Marcina Millard, MD;  Location: [facility] CV [facility];  Service: Cardiovascular;  Laterality: N/A;   MICROLARYNGOSCOPY     3-4 yrs ago   MICROLARYNGOSCOPY Left 03/22/2015   Procedure: MICROLARYNGOSCOPY WITH EXCISION V CORD LEFT AND BIOPSY;  Surgeon: Linus Salmons, MD;  Location: [facility];  Service: ENT;  Laterality: Left;   PTOSIS REPAIR Bilateral 09/22/2016   Procedure: PTOSIS REPAIR;  Surgeon: Imagene Riches, MD;  Location: [facility];  Service: Ophthalmology;  Laterality: Bilateral;   VEIN LIGATION Left     Home Medications:  Allergies as of 06/23/2023   No Known Allergies      Medication List        Accurate as of June 23, 2023 11:07 AM. If you have any questions, ask your nurse or doctor.          aspirin 81 MG chewable tablet Chew 1 tablet (81 mg total) by mouth daily.   atorvastatin 80 MG tablet Commonly known as: LIPITOR Take 1 tablet (80 mg total) by mouth daily at 6 PM.   doxycycline 20 MG tablet Commonly known as: PERIOSTAT Take 20 mg by mouth 2 (two) times daily.   levothyroxine 25 MCG tablet Commonly known as: SYNTHROID Take 25 mcg by mouth daily before breakfast.   metoprolol tartrate 25 MG tablet Commonly known as: LOPRESSOR Take 1 tablet (25 mg total) by mouth 2 (two) times daily.   rosuvastatin 5 MG tablet Commonly known as: CRESTOR Take 5 mg by mouth daily.   Trelegy Ellipta 100-62.5-25 MCG/ACT Aepb Generic drug: Fluticasone-Umeclidin-Vilant Inhale into the lungs.        Family History: Family History  Problem Relation Age of Onset   Cancer Mother        Melanoma   Prolactinoma Neg Hx    Prostate cancer Neg Hx    Kidney cancer Neg Hx     Social History:  reports that he quit smoking about 35 years ago. His smoking use included cigarettes. He has never used smokeless tobacco. He reports current alcohol use of about 18.0 standard drinks of alcohol per week. He reports that he does  not use drugs.   Physical Exam: BP (!) 170/84   Pulse 80   Ht 5\' 11"  (1.803 m)   Wt 185 lb (83.9 kg)   BMI 25.80 kg/m   Constitutional:  Alert and oriented, No acute distress. HEENT: [place] AT, moist mucus membranes.  Trachea midline, no masses. Neurologic: Grossly intact, no focal deficits, moving all 4 extremities. Psychiatric: Normal mood and affect.   Pertinent Imaging: EXAM: CT ABDOMEN AND PELVIS WITHOUT AND WITH CONTRAST  TECHNIQUE: Multidetector CT imaging of the abdomen and pelvis was performed following the standard protocol before and following the bolus administration of intravenous contrast.  RADIATION DOSE REDUCTION: This exam was performed according to the departmental dose-optimization program which includes automated exposure control, adjustment of the mA and/or kV according to patient size and/or use of iterative reconstruction technique.  CONTRAST:  OMNIPAQUE IOHEXOL 300 MG/ML  SOLN  COMPARISON:  04/11/2018  FINDINGS: Lower Chest: No acute findings.  Hepatobiliary: No suspicious hepatic masses identified. A few tiny sub-cm hepatic cysts are noted. Gallbladder is unremarkable. No evidence of  biliary ductal dilatation.  Pancreas:  No mass or inflammatory changes.  Spleen: Within normal limits in size and appearance.  Adrenals/Urinary Tract: No adrenal masses identified. No evidence of urolithiasis. A few small benign-appearing renal cysts are noted (No followup imaging is recommended). No suspicious renal masses identified. Mild left hydroureteronephrosis is seen to the level of the bladder. Mild plaque-like wall thickening is seen along the left lateral, posterior, and inferior bladder wall, which involves the left UVJ, and is highly suspicious for bladder carcinoma.  Stomach/Bowel: No evidence of obstruction, inflammatory process or abnormal fluid collections.  Vascular/Lymphatic: No pathologically enlarged lymph nodes. No acute vascular  findings.  Reproductive:  Mildly enlarged prostate.  Other:  None.  Musculoskeletal: No suspicious bone lesions identified. Schmorl's node incidentally noted along the inferior endplate of L2.  IMPRESSION: Mild left hydroureteronephrosis due to plaque-like wall thickening involving the left bladder wall and UVJ, highly suspicious for bladder carcinoma.  No evidence of metastatic disease.  Mildly enlarged prostate.   Electronically Signed By: Danae Orleans M.D. On: 06/10/2023 09:10  This was personally reviewed and I agree with the radiologic interpretation.   Assessment & Plan:    1. 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  - The plan is to proceed directly to surgery bypassing office cystoscopy given the level of concern.  A single dose of intravesical chemotherapy will be administered post-operatively to prevent tumor cell implantation.  - He will be scheduled for surgery in the next two to three weeks, pending cardiology clearance from Dr. Darrold Junker.  - Follow-up will occur approximately one week post-surgery to discuss pathology results and further management. - He has a history of myocardial infarction and revascularization in 2019. He is currently on a baby aspirin. Cardiologist clearance will be obtained prior to surgery to ensure he is in optimal condition for the procedure. - Pre-op urine culture today - Information about TURBT was provided  2. Left Hydronephrosis - Likely secondary to obstruction of the UVJ and may require ureteral stenting - His creatinine luckily is normal at 0.9.   3.  Elevated PSA - His PSA is chronically elevated in the 7 to 8 range, currently at 8.2.  - Given his age and stable PSA levels, no further follow-up is recommended for this issue at this time.  Return for TURBT.  I have reviewed  the above documentation for accuracy and completeness, and I agree with the above.   [name], MD   [facility] [address], [address] [place], [place] [address] [phone]

## 2023-06-23 NOTE — H&P (View-Only) (Signed)
 Kevin Glover,acting as a Neurosurgeon for Kevin Scotland, MD.,have documented all relevant documentation on the behalf of Kevin Scotland, MD,as directed by  Kevin Scotland, MD while in the presence of Kevin Scotland, MD.  06/23/23 11:07 AM   Kevin Glover October 27, 1940 [id number]  Referring provider: Barbette Reichmann, MD [address] [facility] [place],  [place] [address]  Chief Complaint  Patient presents with   Establish Care   Elevated PSA   Hematuria    HPI: 83 y/o male presents to establish care for hematuria and elevated PSA.   He has a history of microscopic hematuria evaluation and was previously a patient of Dr. Richardo Glover. He underwent biopsy and surgery for multiple small erythematous lesions in the bladder, which showed epithelial ulceration, chronic inflammation, and hyperemia, but no evidence of atypia or malignancy.   More recently, he underwent a hematuria evaluation.  CT scan of the abdomen and pelvis was personally reviewed by me and showed mild left hydronephrosis to the level of the bladder and plaque-like thickening along the left lateral posterior and inferior bladder walls, involving the left UVJ, highly suspicious for malignancy. Prostatomegaly was also noted.   He reports mild symptoms, including occasional leakage after urination and nocturia, which he attributes to fluid intake before bed. He denies significant pain but mentions occasional discomfort under his belt, which he associates with certain activities or positions.   He is a former smoker, having smoked two packs a day for thirty years before quitting in the early nineties.  He has a significant cardiac history including coronary artery disease and history of M.I. He underwent a revascularization procedure in 2019. He is not on any blood thinners other than a baby aspirin.  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   Myocardial infarction (HCC)    05/14/17   Wears dentures    full upper and lower    Surgical History: Past Surgical History:  Procedure Laterality Date   BROW LIFT Bilateral 09/22/2016   Procedure: BLEPHAROPLASTY;  Surgeon: Kevin Riches, MD;  Location: [facility];  Service: Ophthalmology;  Laterality: Bilateral;   CATARACT EXTRACTION W/PHACO Right 06/02/2016   Procedure: CATARACT EXTRACTION PHACO AND INTRAOCULAR LENS PLACEMENT (IOC) right;  Surgeon: Kevin Crane, MD;  Location: [facility];  Service: Ophthalmology;  Laterality: Right;   CATARACT EXTRACTION W/PHACO Left 06/30/2016   Procedure: CATARACT EXTRACTION PHACO AND INTRAOCULAR LENS PLACEMENT (IOC)  Left;  Surgeon: Kevin Crane, MD;  Location: [facility];  Service: Ophthalmology;  Laterality: Left;   CORONARY ANGIOPLASTY     1/19   CORONARY/GRAFT ACUTE MI REVASCULARIZATION N/A 05/12/2017   Procedure: Coronary/Graft Acute MI Revascularization;  Surgeon: Kevin Millard, MD;  Location: [facility];  Service: Cardiovascular;  Laterality: N/A;   CYSTOSCOPY WITH BIOPSY N/A 06/03/2018   Procedure: CYSTOSCOPY WITH bladder BIOPSY;  Surgeon: Kevin Come, MD;  Location: [facility] OR[facility];  Service: Urology;  Laterality: N/A;   CYSTOSCOPY WITH FULGERATION N/A 06/03/2018   Procedure: CYSTOSCOPY WITH FULGERATION;  Surgeon: Kevin Come, MD;  Location: [facility] OR[facility];  Service: Urology;  Laterality: N/A;   HERNIA REPAIR     KNEE ARTHROSCOPY WITH MEDIAL MENISECTOMY Right 09/02/2017  Procedure: KNEE ARTHROSCOPY WITH PARTIAL MEDIAL AND LATERAL MENISECTOMY, PARTIAL SYNOVECTOMY;  Surgeon: Kevin Bucker, MD;  Location: [facility] OR[facility];  Service: Orthopedics;  Laterality: Right;   LEFT HEART CATH AND CORONARY ANGIOGRAPHY N/A 05/12/2017   Procedure: LEFT HEART CATH AND  CORONARY ANGIOGRAPHY;  Surgeon: Kevin Millard, MD;  Location: [facility];  Service: Cardiovascular;  Laterality: N/A;   MICROLARYNGOSCOPY     3-4 yrs ago   MICROLARYNGOSCOPY Left 03/22/2015   Procedure: MICROLARYNGOSCOPY WITH EXCISION V CORD LEFT AND BIOPSY;  Surgeon: Kevin Salmons, MD;  Location: [facility];  Service: ENT;  Laterality: Left;   PTOSIS REPAIR Bilateral 09/22/2016   Procedure: PTOSIS REPAIR;  Surgeon: Kevin Riches, MD;  Location: [facility];  Service: Ophthalmology;  Laterality: Bilateral;   VEIN LIGATION Left     Home Medications:  Allergies as of 06/23/2023   No Known Allergies      Medication List        Accurate as of June 23, 2023 11:07 AM. If you have any questions, ask your nurse or doctor.          aspirin 81 MG chewable tablet Chew 1 tablet (81 mg total) by mouth daily.   atorvastatin 80 MG tablet Commonly known as: LIPITOR Take 1 tablet (80 mg total) by mouth daily at 6 PM.   doxycycline 20 MG tablet Commonly known as: PERIOSTAT Take 20 mg by mouth 2 (two) times daily.   levothyroxine 25 MCG tablet Commonly known as: SYNTHROID Take 25 mcg by mouth daily before breakfast.   metoprolol tartrate 25 MG tablet Commonly known as: LOPRESSOR Take 1 tablet (25 mg total) by mouth 2 (two) times daily.   rosuvastatin 5 MG tablet Commonly known as: CRESTOR Take 5 mg by mouth daily.   Trelegy Ellipta 100-62.5-25 MCG/ACT Aepb Generic drug: Fluticasone-Umeclidin-Vilant Inhale into the lungs.        Family History: Family History  Problem Relation Age of Onset   Cancer Mother        Melanoma   Prolactinoma Neg Hx    Prostate cancer Neg Hx    Kidney cancer Neg Hx     Social History:  reports that he quit smoking about 35 years ago. His smoking use included cigarettes. He has never used smokeless tobacco. He reports current alcohol use of about 18.0 standard drinks of alcohol per week. He reports that he does  not use drugs.   Physical Exam: BP (!) 170/84   Pulse 80   Ht 5\' 11"  (1.803 m)   Wt 185 lb (83.9 kg)   BMI 25.80 kg/m   Constitutional:  Alert and oriented, No acute distress. HEENT: [place] AT, moist mucus membranes.  Trachea midline, no masses. Neurologic: Grossly intact, no focal deficits, moving all 4 extremities. Psychiatric: Normal mood and affect.   Pertinent Imaging: EXAM: CT ABDOMEN AND PELVIS WITHOUT AND WITH CONTRAST  TECHNIQUE: Multidetector CT imaging of the abdomen and pelvis was performed following the standard protocol before and following the bolus administration of intravenous contrast.  RADIATION DOSE REDUCTION: This exam was performed according to the departmental dose-optimization program which includes automated exposure control, adjustment of the mA and/or kV according to patient size and/or use of iterative reconstruction technique.  CONTRAST:  OMNIPAQUE IOHEXOL 300 MG/ML  SOLN  COMPARISON:  04/11/2018  FINDINGS: Lower Chest: No acute findings.  Hepatobiliary: No suspicious hepatic masses identified. A few tiny sub-cm hepatic cysts are noted. Gallbladder is unremarkable. No evidence of  biliary ductal dilatation.  Pancreas:  No mass or inflammatory changes.  Spleen: Within normal limits in size and appearance.  Adrenals/Urinary Tract: No adrenal masses identified. No evidence of urolithiasis. A few small benign-appearing renal cysts are noted (No followup imaging is recommended). No suspicious renal masses identified. Mild left hydroureteronephrosis is seen to the level of the bladder. Mild plaque-like wall thickening is seen along the left lateral, posterior, and inferior bladder wall, which involves the left UVJ, and is highly suspicious for bladder carcinoma.  Stomach/Bowel: No evidence of obstruction, inflammatory process or abnormal fluid collections.  Vascular/Lymphatic: No pathologically enlarged lymph nodes. No acute vascular  findings.  Reproductive:  Mildly enlarged prostate.  Other:  None.  Musculoskeletal: No suspicious bone lesions identified. Schmorl's node incidentally noted along the inferior endplate of L2.  IMPRESSION: Mild left hydroureteronephrosis due to plaque-like wall thickening involving the left bladder wall and UVJ, highly suspicious for bladder carcinoma.  No evidence of metastatic disease.  Mildly enlarged prostate.   Electronically Signed By: Kevin Glover M.D. On: 06/10/2023 09:10  This was personally reviewed and I agree with the radiologic interpretation.   Assessment & Plan:    1. 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  - The plan is to proceed directly to surgery bypassing office cystoscopy given the level of concern.  A single dose of intravesical chemotherapy will be administered post-operatively to prevent tumor cell implantation.  - He will be scheduled for surgery in the next two to three weeks, pending cardiology clearance from Dr. Darrold Glover.  - Follow-up will occur approximately one week post-surgery to discuss pathology results and further management. - He has a history of myocardial infarction and revascularization in 2019. He is currently on a baby aspirin. Cardiologist clearance will be obtained prior to surgery to ensure he is in optimal condition for the procedure. - Pre-op urine culture today - Information about TURBT was provided  2. Left Hydronephrosis - Likely secondary to obstruction of the UVJ and may require ureteral stenting - His creatinine luckily is normal at 0.9.   3.  Elevated PSA - His PSA is chronically elevated in the 7 to 8 range, currently at 8.2.  - Given his age and stable PSA levels, no further follow-up is recommended for this issue at this time.  Return for TURBT.  I have reviewed  the above documentation for accuracy and completeness, and I agree with the above.   Kevin Scotland, MD   [facility] [address], [address] [place], [place] [address] [phone]

## 2023-06-24 ENCOUNTER — Other Ambulatory Visit: Payer: Self-pay

## 2023-06-24 ENCOUNTER — Telehealth: Payer: Self-pay

## 2023-06-24 DIAGNOSIS — C679 Malignant neoplasm of bladder, unspecified: Secondary | ICD-10-CM

## 2023-06-24 NOTE — Progress Notes (Signed)
  Phone Number: [phone] for Surgical Coordinator Fax Number: [phone]  REQUEST FOR SURGICAL CLEARANCE     Date: Date: 06/24/23  Faxed to: Dr. Darrold Junker, MD  Surgeon: Dr. [name], MD     Date of Surgery: 07/05/2023  Operation: Left Retrograde Pyelogram, Left Ureteral Stent Placement, Possible Left Ureteroscopy, Transurethral Resection of Bladder Tumor with Intravesical Instillation of Gemcitabine   Anesthesia Type: General   Diagnosis: Bladder Cancer  Patient Requires:   Cardiac / Vascular Clearance : Yes  Reason: Patient states that he is not currently taking ASA until after procedure.   Risk Assessment:    Low   []       Moderate   []     High   []           This patient is optimized for surgery  YES []       NO   []    I recommend further assessment/workup prior to surgery. YES []      NO  []   Appointment scheduled for: _______________________   Further recommendations: ____________________________________     Physician Signature:__________________________________   Printed Name: ________________________________________   Date: _________________

## 2023-06-24 NOTE — Progress Notes (Signed)
 Surgical Physician Order Form [facility]  Dr. [name], MD  * Scheduling expectation : Next Available  *Length of Case:   *Clearance needed: yes; Dr. Darrold Junker.   *Anticoagulation Instructions: Hold all anticoagulants  *Aspirin Instructions: May continue baby ASA if cardiology prefers  *Post-op visit Date/Instructions:  1 week path review  *Diagnosis: Bladder cancer  *Procedure: left retrograde pyelogram, left ureteral stent placement, possible left ureteroscopy, TURBT 2-5cm (81191), instillation of intravesical chemotherapy   Additional orders: Gemcitabine 2000mg  bladder instillation  -Admit type: OUTpatient  -Anesthesia: General  -VTE Prophylaxis Standing Order SCD's       Other:   -Standing Lab Orders Per Anesthesia    Lab other: None  -Standing Test orders EKG/Chest x-ray per Anesthesia       Test other:   - Medications:  Ancef 2gm IV  -Other orders:  Please prior auth large TURBT as well, size is still unknow

## 2023-06-24 NOTE — Progress Notes (Signed)
   [facility] Surgical Posting Form  Surgery Date: Date: 07/05/2023  Surgeon: Dr. 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.9 Bladder Cancer  -CPT: 74420, 25427, 52351, 52235, 52240, [phone]  Surgery: Left Retrograde Pyelogram, Left Ureteral Stent Placement, Possible Left Ureteroscopy, Transurethral Resection of Bladder Tumor with Intravesical Instillation of Gemcitabine  Stop Anticoagulations: Yes and may continue ASA if cards deems necessary  Cardiac/Medical/Pulmonary Clearance needed: Yes  Clearance needed from Dr: Darrold Junker  Clearance request sent on: Date: 06/24/23  *Orders entered into EPIC  Date: 06/24/23   *Case booked in EPIC  Date: 06/24/23  *Notified pt of Surgery: Date: 06/24/23  PRE-OP UA & CX: no  *Placed into Prior Authorization Work [place] Date: 06/24/23  Assistant/laser/rep:No

## 2023-06-24 NOTE — Telephone Encounter (Signed)
  Per Dr. Apolinar Junes, Patient is to be scheduled for Left Retrograde Pyelogram, Left Ureteral Stent Placement, Possible Left Ureteroscopy, Transurethral Resection of Bladder Tumor with Intravesical Instillation of Gemcitabine   Kevin Glover was contacted and possible surgical dates were discussed, Monday March 17th, 2025 was agreed upon for surgery.   Patient was directed to call [phone] between 1-3pm the day before surgery to find out surgical arrival time.  Instructions were given not to eat or drink from midnight on the night before surgery and have a driver for the day of surgery. On the surgery day patient was instructed to enter through the [facility] entrance of [facility] report the Same Day Surgery desk.   Pre-Admit Testing will be in contact via phone to set up an interview with the anesthesia team to review your history and medications prior to surgery.   Reminder of this information was sent via Mail to the patient.

## 2023-06-27 LAB — CULTURE, URINE COMPREHENSIVE

## 2023-06-29 ENCOUNTER — Other Ambulatory Visit: Payer: Self-pay

## 2023-06-29 ENCOUNTER — Encounter
Admission: RE | Admit: 2023-06-29 | Discharge: 2023-06-29 | Disposition: A | Discharge status: Home / Self Care | Source: Ambulatory Visit | Attending: Urology | Admitting: Urology

## 2023-06-29 VITALS — Ht 71.0 in | Wt 185.0 lb

## 2023-06-29 DIAGNOSIS — I2119 ST elevation (STEMI) myocardial infarction involving other coronary artery of inferior wall: Secondary | ICD-10-CM

## 2023-06-29 DIAGNOSIS — I10 Essential (primary) hypertension: Secondary | ICD-10-CM

## 2023-06-29 DIAGNOSIS — I2510 Atherosclerotic heart disease of native coronary artery without angina pectoris: Secondary | ICD-10-CM

## 2023-06-29 DIAGNOSIS — I213 ST elevation (STEMI) myocardial infarction of unspecified site: Secondary | ICD-10-CM

## 2023-06-29 HISTORY — DX: Dyspnea, unspecified: R06.00

## 2023-06-29 HISTORY — DX: ST elevation (STEMI) myocardial infarction involving right coronary artery: I21.11

## 2023-06-29 HISTORY — DX: Pure hypercholesterolemia, unspecified: E78.00

## 2023-06-29 HISTORY — DX: Other forms of dyspnea: R06.09

## 2023-06-29 NOTE — Patient Instructions (Addendum)
 Your procedure is scheduled on: Monday 07/05/23  Report to the Registration Desk on the 1st floor of the [facility]. To find out your arrival time, please call [phone] between 1PM - 3PM on: Friday 07/02/23 If your arrival time is 6:00 am, do not arrive before that time as the [facility] entrance doors do not open until 6:00 am.  REMEMBER: Instructions that are not followed completely may result in serious medical risk, up to and including death; or upon the discretion of your surgeon and anesthesiologist your surgery may need to be rescheduled.  Do not eat food or drink fluids after midnight the night before surgery.  No gum chewing or hard candies.   One week prior to surgery: Stop Anti-inflammatories (NSAIDS) such as Advil, Aleve, Ibuprofen, Motrin, Naproxen, Naprosyn and Aspirin based products such as Excedrin, Goody's Powder, BC Powder. Stop ANY OVER THE COUNTER supplements until after surgery.  You may however, continue to take Tylenol if needed for pain up until the day of surgery.   Continue taking all of your other prescription medications up until the day of surgery.  ON THE DAY OF SURGERY ONLY TAKE THESE MEDICATIONS WITH SIPS OF WATER:  levothyroxine (SYNTHROID, LEVOTHROID) 25 MCG    Use inhalers on the day of surgery and bring to the hospital. TRELEGY ELLIPTA 100-62.5-25 MCG/ACT AEPB    No Alcohol for 24 hours before or after surgery.  No Smoking including e-cigarettes for 24 hours before surgery.  No chewable tobacco products for at least 6 hours before surgery.  No nicotine patches on the day of surgery.  Do not use any "recreational" drugs for at least a week (preferably 2 weeks) before your surgery.  Please be advised that the combination of cocaine and anesthesia may have negative outcomes, up to and including death. If you test positive for cocaine, your surgery will be cancelled.  On the morning of surgery brush your teeth with toothpaste and water,  you may rinse your mouth with mouthwash if you wish. Do not swallow any toothpaste or mouthwash.  Use CHG Soap or wipes as directed on instruction sheet.  Do not wear jewelry, make-up, hairpins, clips or nail polish.  For welded (permanent) jewelry: bracelets, anklets, waist bands, etc.  Please have this removed prior to surgery.  If it is not removed, there is a chance that hospital personnel will need to cut it off on the day of surgery.  Do not wear lotions, powders, or perfumes.   Do not shave body hair from the neck down 48 hours before surgery.  Contact lenses, hearing aids and dentures may not be worn into surgery.  Do not bring valuables to the hospital. [facility] is not responsible for any missing/lost belongings or valuables.   Total Shoulder Arthroplasty:  use Benzoyl Peroxide 5% Gel as directed on instruction sheet.  Bring your C-PAP to the hospital in case you may have to spend the night.   Notify your doctor if there is any change in your medical condition (cold, fever, infection).  Wear comfortable clothing (specific to your surgery type) to the hospital.  After surgery, you can help prevent lung complications by doing breathing exercises.  Take deep breaths and cough every 1-2 hours. Your doctor may order a device called an Incentive Spirometer to help you take deep breaths. When coughing or sneezing, hold a pillow firmly against your incision with both hands. This is called "splinting." Doing this helps protect your incision. It also decreases belly  discomfort.  If you are being admitted to the hospital overnight, leave your suitcase in the car. After surgery it may be brought to your room.  In case of increased patient census, it may be necessary for you, the patient, to continue your postoperative care in the Same Day Surgery department.  If you are being discharged the day of surgery, you will not be allowed to drive home. You will need a responsible individual  to drive you home and stay with you for 24 hours after surgery.   If you are taking public transportation, you will need to have a responsible individual with you.  Please call the Pre-admissions Testing Dept. at [phone] if you have any questions about these instructions.  Surgery Visitation Policy:  Patients having surgery or a procedure may have two visitors.  Children under the age of 39 must have an adult with them who is not the patient.  Temporary Visitor Restrictions Due to increasing cases of flu, RSV and COVID-19: Children ages 25 and under will not be able to visit patients in [facility] hospitals under most circumstances.  Inpatient Visitation:    Visiting hours are 7 a.m. to 8 p.m. Up to four visitors are allowed at one time in a patient room. The visitors may rotate out with other people during the day.  One visitor age 16 or older may stay with the patient overnight and must be in the room by 8 p.m.

## 2023-06-30 ENCOUNTER — Encounter
Admission: RE | Admit: 2023-06-30 | Discharge: 2023-06-30 | Disposition: A | Discharge status: Home / Self Care | Source: Ambulatory Visit | Attending: Urology | Admitting: Urology

## 2023-06-30 DIAGNOSIS — I10 Essential (primary) hypertension: Secondary | ICD-10-CM | POA: Insufficient documentation

## 2023-06-30 DIAGNOSIS — I2119 ST elevation (STEMI) myocardial infarction involving other coronary artery of inferior wall: Secondary | ICD-10-CM | POA: Diagnosis not present

## 2023-06-30 DIAGNOSIS — I213 ST elevation (STEMI) myocardial infarction of unspecified site: Secondary | ICD-10-CM

## 2023-06-30 DIAGNOSIS — Z01810 Encounter for preprocedural cardiovascular examination: Secondary | ICD-10-CM | POA: Diagnosis not present

## 2023-06-30 DIAGNOSIS — I2510 Atherosclerotic heart disease of native coronary artery without angina pectoris: Secondary | ICD-10-CM | POA: Diagnosis not present

## 2023-07-02 ENCOUNTER — Encounter: Payer: Self-pay | Admitting: Urology

## 2023-07-02 NOTE — Progress Notes (Signed)
 Perioperative / Anesthesia Services  Pre-Admission Testing Clinical Review / Pre-Operative Anesthesia Consult  Date: 07/02/23  Patient Demographics:  Name: Kevin Glover DOB: [birth year] MRN:   [id number]  Planned Surgical Procedure(s):    Case: [id number] Date/Time: 07/05/23 1424   Procedures:      CYSTOSCOPY, WITH RETROGRADE PYELOGRAM (Left)     CYSTOSCOPY, WITH STENT INSERTION (Left)     URETEROSCOPY (Left)     TURBT (TRANSURETHRAL RESECTION OF BLADDER TUMOR)     INSTILLATION, BLADDER   Anesthesia type: General   Diagnosis: Malignant neoplasm of urinary bladder, unspecified site (HCC) [C67.9]   Pre-op diagnosis: Bladder Cancer   Location: [facility] OR ROOM 10 / [facility]   Surgeons: [name], MD      NOTE: Available PAT nursing documentation and vital signs have been reviewed. Clinical nursing staff has updated patient's PMH/PSHx, current medication list, and drug allergies/intolerances to ensure comprehensive history available to assist in medical decision making as it pertains to the aforementioned surgical procedure and anticipated anesthetic course. Extensive review of available clinical information personally performed. Kevin Glover PMH and PSHx updated with any diagnoses/procedures that  may have been inadvertently omitted during his intake with the pre-admission testing department's nursing staff.  Clinical Discussion:  Kevin Glover is a 83 y.o. male who is submitted for pre-surgical anesthesia review and clearance prior to him undergoing the above procedure. Patient is a Former Smoker (quit 04/1988). 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  Patient is followed by cardiology Darrold Junker, MD). He was last seen in the cardiology clinic on 05/20/2023; notes reviewed. At  the time of his clinic visit, patient doing well overall from a cardiovascular perspective.  Patient with complaints of exertional dyspnea that was reportedly stable to baseline.  Patient denied any chest pain, shortness of breath, PND, orthopnea, palpitations, significant peripheral edema, weakness, fatigue, vertiginous symptoms, or presyncope/syncope. Patient with a past medical history significant for cardiovascular diagnoses. Documented physical exam was grossly benign, providing no evidence of acute exacerbation and/or decompensation of the patient's known cardiovascular conditions.  Patient suffered an inferior wall STEMI on 05/12/2017.  Diagnostic LEFT heart catheterization was performed revealing multivessel CAD; 50% ostial OM1-OM1, 40% proximal LAD, 60% ostial D1, 99% mid-distal RCA, 50% proximal RCA, 30% distal RCA, and 60% posterior atrioventricular artery.  PCI was subsequently performed placing a 3.25 x 28 mm Xience Sierra DES x 1 to the mid to distal RCA lesion yielding excellent angiographic result and TIMI-3 flow.  Most recent TTE performed on 05/13/2017 revealed a normal left ventricular systolic function with an EF of 60-65%%. There were no regional wall motion abnormalities. Right ventricular size and function normal with a TAPSE measuring 2.69 cm  (normal range >/= 1.6cm).  There was trivial mitral and mild tricuspid  valve regurgitation. All transvalvular gradients were noted to be normal providing no evidence suggestive of valvular stenosis. Aorta normal in size with no evidence of ectasia or aneurysmal dilatation.  Most recent myocardial perfusion imaging study was performed on 01/09/2021 revealing a  normal left ventricular systolic function with an EF of 54%.  There were no regional wall motion abnormalities.  No artifact or left ventricular cavity size enlargement appreciated on review of imaging. SPECT images demonstrated no evidence of stress-induced myocardial ischemia or  arrhythmia; no scintigraphic evidence of scar. TID ratio =  1.22. Study determined to be normal and low risk.  Blood pressure well controlled at 122/74 mmHg on currently prescribed beta-blocker (metoprolol tartrate) monotherapy.  Patient is on rosuvastatin for his HLD diagnosis and ASCVD prevention. Patient is not diabetic. He does not have an OSAH diagnosis.  Functional capacity limited by patient's age and multiple medical comorbidities. With that said, patient is able to complete all of his ADLs/IADLs independently without cardiovascular limitation.  Per the [facility], patient is able to achieve at least 4 METS of physical activity without experiencing any significant degree of angina/anginal equivalent symptoms.  No changes were made to his medication regimen.  Plans were for potential surveillance stress testing at next visit.  Patient to follow-up with outpatient cardiology in 6 months or sooner if needed.  Kevin Glover is scheduled for an elective CYSTOSCOPY, WITH RETROGRADE PYELOGRAM (Left); CYSTOSCOPY, WITH STENT INSERTION (Left); URETEROSCOPY (Left); TURBT (TRANSURETHRAL RESECTION OF BLADDER TUMOR); INSTILLATION, BLADDER on 07/05/2023 with Dr. [name], MD.  Given patient's past medical history significant for cardiovascular diagnoses, presurgical cardiac clearance was sought by the PAT team. Per cardiology, "this patient is optimized for surgery and may proceed with the planned procedural course with a LOW risk of significant perioperative cardiovascular complications".  In review of the patient's chart, it is noted that he is on daily oral antithrombotic therapy. He has been instructed on recommendations for holding his ASA for 3 days prior to his procedure with plans to restart as soon as postoperative bleeding risk felt to be minimized by his attending surgeon.  During his PAT interview it was discovered that patient had been holding his low-dose ASA therapy since 05/11/2023; reasons  unclear.  Patient denies previous perioperative complications with anesthesia in the past. In review his EMR, it is noted that patient underwent a general anesthetic course here at [facility] in 05/2018 without documented complications.      06/29/2023   11:52 AM 06/23/2023   10:10 AM 06/20/2018    1:36 PM  Vitals with BMI  Height 5\' 11"  5\' 11"  5\' 11"   Weight 185 lbs 185 lbs 188 lbs  BMI 25.81 25.81 26.23  Systolic  170 144  Diastolic  84 84  Pulse  80 96   Providers/Specialists:  NOTE: Primary physician provider listed below. Patient may have been seen by APP or partner within same practice.   PROVIDER ROLE / SPECIALTY LAST Jaquelyn Bitter, MD Urology (Surgeon) 06/23/2023  Barbette Reichmann, MD Primary Care Provider 05/11/2023  Marcina Millard, MD Cardiology 05/20/2023   Allergies:  No Known Allergies Current Home Medications:   No current facility-administered medications for this encounter.    levothyroxine (SYNTHROID, LEVOTHROID) 25 MCG tablet   rosuvastatin (CRESTOR) 5 MG tablet   TRELEGY ELLIPTA 100-62.5-25 MCG/ACT AEPB   aspirin 81 MG chewable tablet   atorvastatin (LIPITOR) 80 MG tablet   metoprolol tartrate (LOPRESSOR) 25 MG tablet   History:   Past Medical History:  Diagnosis Date   Acquired hypothyroidism    a.) secondary to XRT induced thyroid damage when vocal cord treated   Acute ST elevation myocardial infarction (STEMI) of inferior wall (HCC) 05/12/2017   a.) LHC/PCI 05/12/2017: 99% m-dRCA (3.25 x 28 mm Xience Sierra DES)   Aortic atherosclerosis (HCC)    Arthritis    Aspiration pneumonia (HCC) 04/2012   Bilateral eyelid ptosis; s/p repair    BPH (benign prostatic hyperplasia)    Chronic cough    a.) secondary  to "throat irritation" s/p XRT to vocal cords   COPD (chronic obstructive pulmonary disease) (HCC)    Coronary artery disease    a.) inf STEMI 05/12/2017 --> LHC: 50% oOM1-OM1, 40% pLAD, 60%  oD1, 99% m-dRCA (3.25 x 28 mm Xience Sierra DES), 50% pRCA, 30% dRCA, 60% post atrio; b.) MV 01/09/2021: no ischemia   Dyspnea    Exertional dyspnea    Full dentures    History of bilateral cataract extraction    Left upper lobe pulmonary nodule    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   a.) s/p XRT   Long-term use of aspirin therapy    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   a.) CT hematuria workup 05/26/2023: LEFT hydroureteronephrosis due to plaque-like wall thickening involving the LEFT bladder wall and UVJ; highly suspicious for bladder carcinoma   Pure hypercholesterolemia    Renal cyst    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    Schmorl's nodes of lumbar region    Vocal cord polyp    Past Surgical History:  Procedure Laterality Date   BACK SURGERY     BROW LIFT Bilateral 09/22/2016   Procedure: BLEPHAROPLASTY;  Surgeon: Imagene Riches, MD;  Location: [facility];  Service: Ophthalmology;  Laterality: Bilateral;   CATARACT EXTRACTION W/PHACO Right 06/02/2016   Procedure: CATARACT EXTRACTION PHACO AND INTRAOCULAR LENS PLACEMENT (IOC) right;  Surgeon: Nevada Crane, MD;  Location: [facility];  Service: Ophthalmology;  Laterality: Right;   CATARACT EXTRACTION W/PHACO Left 06/30/2016   Procedure: CATARACT EXTRACTION PHACO AND INTRAOCULAR LENS PLACEMENT (IOC)  Left;  Surgeon: Nevada Crane, MD;  Location: [facility];  Service: Ophthalmology;  Laterality: Left;   CORONARY/GRAFT ACUTE MI REVASCULARIZATION N/A 05/12/2017   Procedure: Coronary/Graft Acute MI Revascularization;  Surgeon: Marcina Millard, MD;  Location: [facility];  Service: Cardiovascular;  Laterality: N/A;   CYSTOSCOPY WITH BIOPSY N/A 06/03/2018   Procedure: CYSTOSCOPY WITH bladder BIOPSY;  Surgeon: Sondra Come, MD;  Location: [facility];  Service: Urology;  Laterality: N/A;   CYSTOSCOPY WITH FULGERATION N/A  06/03/2018   Procedure: CYSTOSCOPY WITH FULGERATION;  Surgeon: Sondra Come, MD;  Location: [facility];  Service: Urology;  Laterality: N/A;   HERNIA REPAIR     KNEE ARTHROSCOPY WITH MEDIAL MENISECTOMY Right 09/02/2017   Procedure: KNEE ARTHROSCOPY WITH PARTIAL MEDIAL AND LATERAL MENISECTOMY, PARTIAL SYNOVECTOMY;  Surgeon: Kennedy Bucker, MD;  Location: [facility];  Service: Orthopedics;  Laterality: Right;   LEFT HEART CATH AND CORONARY ANGIOGRAPHY N/A 05/12/2017   Procedure: LEFT HEART CATH AND CORONARY ANGIOGRAPHY;  Surgeon: Marcina Millard, MD;  Location: [facility];  Service: Cardiovascular;  Laterality: N/A;   MICROLARYNGOSCOPY     3-4 yrs ago   MICROLARYNGOSCOPY Left 03/22/2015   Procedure: MICROLARYNGOSCOPY WITH EXCISION V CORD LEFT AND BIOPSY;  Surgeon: Linus Salmons, MD;  Location: [facility];  Service: ENT;  Laterality: Left;   PTOSIS REPAIR Bilateral 09/22/2016   Procedure: PTOSIS REPAIR;  Surgeon: Imagene Riches, MD;  Location: [facility];  Service: Ophthalmology;  Laterality: Bilateral;   VEIN LIGATION Left    Family History  Problem Relation Age of Onset   Cancer Mother        Melanoma   Prolactinoma Neg Hx    Prostate cancer Neg Hx    Kidney cancer Neg Hx    Social History   Tobacco Use   Smoking status: Former  Current packs/day: 0.00    Types: Cigarettes    Quit date: 04/20/1988    Years since quitting: 35.2   Smokeless tobacco: Never   Tobacco comments:    quit 1991  Substance Use Topics   Alcohol use: Yes    Alcohol/week: 1.0 - 2.0 standard drink of alcohol    Types: 1 - 2 Cans of beer per week    Comment: 1 to 2 beers most days   Pertinent Clinical Results:  LABS:    Component Ref Range & Units 05/04/2023  WBC (White Blood Cell Count) 4.1 - 10.2 10^3/uL 6.3  RBC (Red Blood Cell Count) 4.69 - 6.13 10^6/uL 4.86  Hemoglobin 14.1 - 18.1 gm/dL 09.8  Hematocrit 11.9 - 52.0 % 44.5  MCV (Mean Corpuscular Volume) 80.0  - 100.0 fl 91.6  MCH (Mean Corpuscular Hemoglobin) 27.0 - 31.2 pg 30.2  MCHC (Mean Corpuscular Hemoglobin Concentration) 32.0 - 36.0 gm/dL 33  Platelet Count 147 - 450 10^3/uL 221  RDW-CV (Red Cell Distribution Width) 11.6 - 14.8 % 14  MPV (Mean Platelet Volume) 9.4 - 12.4 fl 9.9  Neutrophils 1.50 - 7.80 10^3/uL 4.14  Lymphocytes 1.00 - 3.60 10^3/uL 1.39  Monocytes 0.00 - 1.50 10^3/uL 0.57  Eosinophils 0.00 - 0.55 10^3/uL 0.15  Basophils 0.00 - 0.09 10^3/uL 0.04  Neutrophil % 32.0 - 70.0 % 65.7  Lymphocyte % 10.0 - 50.0 % 22.1  Monocyte % 4.0 - 13.0 % 9  Eosinophil % 1.0 - 5.0 % 2.4  Basophil% 0.0 - 2.0 % 0.6  Immature Granulocyte % <=0.7 % 0.2  Immature Granulocyte Count <=0.06 10^3/L 0.01  Resulting Agency [facility] - [facility]  Specimen Collected: 05/04/23 07:07   Performed by: [facility] Last Resulted: 05/04/23 09:07  Received From: [facility]  Result Received: 05/11/23 13:55   Component Ref Range & Units 05/04/2023  Glucose 70 - 110 mg/dL 96  Sodium 829 - 562 mmol/L 141  Potassium 3.6 - 5.1 mmol/L 4.2  Chloride 97 - 109 mmol/L 105  Carbon Dioxide (CO2) 22.0 - 32.0 mmol/L 27.7  Urea Nitrogen (BUN) 7 - 25 mg/dL 17  Creatinine 0.7 - 1.3 mg/dL 0.9  Glomerular Filtration Rate (eGFR) >60 mL/min/1.73sq m 84  Calcium 8.7 - 10.3 mg/dL 9.8  AST 8 - 39 U/L 22  ALT 6 - 57 U/L 17  Alk Phos (alkaline Phosphatase) 34 - 104 U/L 78  Albumin 3.5 - 4.8 g/dL 4.6  Bilirubin, Total 0.3 - 1.2 mg/dL 0.6  Protein, Total 6.1 - 7.9 g/dL 7.2  A/G Ratio 1.0 - 5.0 gm/dL 1.8  Resulting Agency [facility]  Specimen Collected: 05/04/23 07:07   Performed by: [facility] Last Resulted: 05/04/23 12:51  Received From: [facility]  Result Received: 06/03/23 16:43   Office Visit on 06/23/2023  Component Date Value Ref Range Status   Specific Gravity, UA 06/23/2023 1.010  1.005 -  1.030 Final   pH, UA 06/23/2023 5.5  5.0 - 7.5 Final   Color, UA 06/23/2023 Yellow  Yellow Final   Appearance Ur 06/23/2023 Clear  Clear Final   Leukocytes,UA 06/23/2023 Negative  Negative Final   Protein,UA 06/23/2023 Negative  Negative/Trace Final   Glucose, UA 06/23/2023 Negative  Negative Final   Ketones, UA 06/23/2023 Negative  Negative Final   RBC, UA 06/23/2023 Trace (A)  Negative Final   Bilirubin, UA 06/23/2023 Negative  Negative Final   Urobilinogen, Ur 06/23/2023 0.2  0.2 - 1.0 mg/dL Final   Nitrite, UA 16/01/9603 Negative  Negative Final   Microscopic Examination 06/23/2023 See below:   Final   Urine Culture, Comprehensive 06/23/2023 Final report   Final   Organism ID, Bacteria 06/23/2023 Comment   Final   No growth in 36 - 48 hours.   WBC, UA 06/23/2023 0-5  0 - 5 /hpf Final   RBC, Urine 06/23/2023 3-10 (A)  0 - 2 /hpf Final   Epithelial Cells (non renal) 06/23/2023 0-10  0 - 10 /hpf Final   Bacteria, UA 06/23/2023 None seen  None seen/Few Final    ECG: Date: 06/30/2023 Time ECG obtained: 0843 AM Rate: 62 bpm Rhythm:  NSR; bifascicular block (RBBB + LAFB) Intervals: PR 172 ms. QRS 146 ms. QTc 477 ms. ST segment and T wave changes: No evidence of acute T wave abnormalities or significant ST segment elevation or depression.  Evidence of a possible, age undetermined, prior infarct:  No Comparison: New bifascicular block noted when compared to tracing obtained on 05/23/2018. Cardiology made aware; tracing reviewed by Dr. Darrold Junker, MD.    IMAGING / PROCEDURES: CT HEMATURIA WORKUP performed on 05/26/2023 Mild left hydroureteronephrosis due to plaque-like wall thickening involving the left bladder wall and UVJ, highly suspicious for bladder carcinoma. No evidence of metastatic disease. Mildly enlarged prostate.  CT CHEST W CONTRAST performed on 08/04/2021 Mild bibasilar scarring is noted with associated bronchiectasis of both lower lobes. No acute consolidation is  noted. 4 mm nodule is noted in left upper lobe. No follow-up needed if patient is low-risk.This recommendation follows the consensus statement: Guidelines for Management of Incidental Pulmonary Nodules Detected on CT Images: From the Fleischner Society 2017; Radiology 2017; 284:228-243. Moderate coronary artery calcifications are noted. Aortic atherosclerosis   TRANSTHORACIC ECHOCARDIOGRAM performed on 05/13/2017 Normal left ventricular systolic function with an EF of 60% No regional wall motion abnormalities Right ventricular size and function normal Mild tricuspid valve regurgitation Normal gradients; no valvular stenosis  LEFT HEART CATHETERIZATION AND CORONARY ANGIOGRAPHY performed on 05/12/2017 Inferior STEMI Multivessel CAD Ost 1st Mrg to 1st Mrg lesion is 50% stenosed. Prox LAD lesion is 40% stenosed. Ost 1st Diag lesion is 60% stenosed. Mid RCA to Dist RCA lesion is 99% stenosed. Prox RCA lesion is 50% stenosed. Dist RCA lesion is 30% stenosed. Post Atrio lesion is 60% stenosed. Successful PCI 3.25 x 28 mm Xience Sierra DES x 1 to the mid to distal RCA Procedure yielded excellent angiographic result and TIMI-3 flow   Impression and Plan:  Kevin Glover has been referred for pre-anesthesia review and clearance prior to him undergoing the planned anesthetic and procedural courses. Available labs, pertinent testing, and imaging results were personally reviewed by me in preparation for upcoming operative/procedural course. [facility] medical record has been updated following extensive record review and patient interview with PAT staff.   This patient has been appropriately cleared by cardiology with an overall LOW risk of experiencing significant perioperative cardiovascular complications. Based on clinical review performed today (07/02/23), barring any significant acute changes in the patient's overall condition, it is anticipated that he will be able to proceed with the planned  surgical intervention. Any acute changes in clinical condition may necessitate his procedure being postponed and/or cancelled. Patient will meet with anesthesia team (MD and/or CRNA) on the day of his procedure for preoperative evaluation/assessment. Questions regarding anesthetic course will be fielded at that time.   Pre-surgical instructions were reviewed with the patient during his PAT appointment, and  questions were fielded to satisfaction by PAT clinical staff. He has been instructed on which medications that he will need to hold prior to surgery, as well as the ones that have been deemed safe/appropriate to take on the day of his procedure. As part of the general education provided by PAT, patient made aware both verbally and in writing, that he would need to abstain from the use of any illegal substances during his perioperative course. He was advised that failure to follow the provided instructions could necessitate case cancellation or result in serious perioperative complications up to and including death. Patient encouraged to contact PAT and/or his surgeon's office to discuss any questions or concerns that may arise prior to surgery; verbalized understanding.   Quentin Mulling, MSN, APRN, FNP-C, CEN [facility]  Perioperative Services Nurse Practitioner Phone: [phone] Fax: [phone] 07/02/23 12:09 PM  NOTE: This note has been prepared using Dragon dictation software. Despite my best ability to proofread, there is always the potential that unintentional transcriptional errors may still occur from this process.

## 2023-07-04 MED ORDER — ORAL CARE MOUTH RINSE: 15.0000 mL | Freq: Once | OROMUCOSAL | Status: AC

## 2023-07-04 MED ORDER — CHLORHEXIDINE GLUCONATE 0.12 % MT SOLN
15.0000 mL | Freq: Once | OROMUCOSAL | Status: AC
  Administered 2023-07-05: 15 mL via OROMUCOSAL

## 2023-07-04 MED ORDER — GEMCITABINE CHEMO FOR BLADDER INSTILLATION 2000 MG
2000.0000 mg | Freq: Once | INTRAVENOUS | Status: DC
Start: 2023-07-04 — End: 2023-07-05

## 2023-07-04 MED ORDER — LACTATED RINGERS IV SOLN: INTRAVENOUS | Status: DC

## 2023-07-04 MED ORDER — CEFAZOLIN SODIUM-DEXTROSE 2-4 GM/100ML-% IV SOLN
2.0000 g | INTRAVENOUS | Status: AC
  Administered 2023-07-05: 2 g via INTRAVENOUS

## 2023-07-05 ENCOUNTER — Ambulatory Visit: Payer: Self-pay | Admitting: Urgent Care

## 2023-07-05 ENCOUNTER — Ambulatory Visit

## 2023-07-05 ENCOUNTER — Encounter
Admission: RE | Disposition: A | Discharge status: Home / Self Care | Payer: Self-pay | Source: Ambulatory Visit | Attending: Urology

## 2023-07-05 ENCOUNTER — Other Ambulatory Visit: Payer: Self-pay

## 2023-07-05 ENCOUNTER — Ambulatory Visit
Admission: RE | Admit: 2023-07-05 | Discharge: 2023-07-05 | Disposition: A | Discharge status: Home / Self Care | Source: Ambulatory Visit | Attending: Urology | Admitting: Urology

## 2023-07-05 ENCOUNTER — Encounter: Payer: Self-pay | Admitting: Urology

## 2023-07-05 DIAGNOSIS — Z87891 Personal history of nicotine dependence: Secondary | ICD-10-CM | POA: Insufficient documentation

## 2023-07-05 DIAGNOSIS — N130 Hydronephrosis with ureteropelvic junction obstruction: Secondary | ICD-10-CM

## 2023-07-05 DIAGNOSIS — C679 Malignant neoplasm of bladder, unspecified: Secondary | ICD-10-CM

## 2023-07-05 DIAGNOSIS — I2510 Atherosclerotic heart disease of native coronary artery without angina pectoris: Secondary | ICD-10-CM | POA: Insufficient documentation

## 2023-07-05 DIAGNOSIS — D303 Benign neoplasm of bladder: Secondary | ICD-10-CM | POA: Insufficient documentation

## 2023-07-05 DIAGNOSIS — N3289 Other specified disorders of bladder: Secondary | ICD-10-CM | POA: Diagnosis not present

## 2023-07-05 DIAGNOSIS — I252 Old myocardial infarction: Secondary | ICD-10-CM | POA: Diagnosis not present

## 2023-07-05 DIAGNOSIS — N329 Bladder disorder, unspecified: Secondary | ICD-10-CM | POA: Diagnosis present

## 2023-07-05 DIAGNOSIS — I700 Atherosclerosis of aorta: Secondary | ICD-10-CM | POA: Insufficient documentation

## 2023-07-05 DIAGNOSIS — N3090 Cystitis, unspecified without hematuria: Secondary | ICD-10-CM | POA: Diagnosis not present

## 2023-07-05 DIAGNOSIS — J449 Chronic obstructive pulmonary disease, unspecified: Secondary | ICD-10-CM | POA: Diagnosis not present

## 2023-07-05 DIAGNOSIS — N1330 Unspecified hydronephrosis: Secondary | ICD-10-CM | POA: Diagnosis not present

## 2023-07-05 DIAGNOSIS — Z8521 Personal history of malignant neoplasm of larynx: Secondary | ICD-10-CM | POA: Insufficient documentation

## 2023-07-05 DIAGNOSIS — N401 Enlarged prostate with lower urinary tract symptoms: Secondary | ICD-10-CM | POA: Diagnosis not present

## 2023-07-05 HISTORY — DX: Unspecified ptosis of bilateral eyelids: H02.403

## 2023-07-05 HISTORY — PX: URETEROSCOPY: SHX842

## 2023-07-05 HISTORY — DX: Bifascicular block: I45.2

## 2023-07-05 HISTORY — DX: Chronic cough: R05.3

## 2023-07-05 HISTORY — DX: Atherosclerosis of aorta: I70.0

## 2023-07-05 HISTORY — DX: Cataract extraction status, right eye: Z98.41

## 2023-07-05 HISTORY — DX: Polyp of vocal cord and larynx: J38.1

## 2023-07-05 HISTORY — DX: Solitary pulmonary nodule: R91.1

## 2023-07-05 HISTORY — DX: Hypothyroidism, unspecified: E03.9

## 2023-07-05 HISTORY — DX: Benign prostatic hyperplasia without lower urinary tract symptoms: N40.0

## 2023-07-05 HISTORY — PX: CYSTOSCOPY/URETEROSCOPY/HOLMIUM LASER/STENT PLACEMENT: SHX6546

## 2023-07-05 HISTORY — DX: Schmorl's nodes, lumbar region: M51.46

## 2023-07-05 HISTORY — DX: Cyst of kidney, acquired: N28.1

## 2023-07-05 HISTORY — PX: TRANSURETHRAL RESECTION OF BLADDER TUMOR: SHX2575

## 2023-07-05 HISTORY — DX: Chronic obstructive pulmonary disease, unspecified: J44.9

## 2023-07-05 HISTORY — DX: Long term (current) use of aspirin: Z79.82

## 2023-07-05 HISTORY — DX: Complete loss of teeth, unspecified cause, unspecified class: K08.109

## 2023-07-05 SURGERY — URETEROSCOPY
Anesthesia: General

## 2023-07-05 MED ORDER — OXYCODONE HCL 5 MG PO TABS: 5.0000 mg | ORAL_TABLET | Freq: Once | ORAL | Status: DC | PRN

## 2023-07-05 MED ORDER — PROPOFOL 10 MG/ML IV BOLUS
INTRAVENOUS | Status: DC | PRN
  Administered 2023-07-05: 100 mg via INTRAVENOUS

## 2023-07-05 MED ORDER — SODIUM CHLORIDE 0.9 % IR SOLN
Status: DC | PRN
  Administered 2023-07-05 (×8): 6000 mL via INTRAVESICAL

## 2023-07-05 MED ORDER — FENTANYL CITRATE (PF) 100 MCG/2ML IJ SOLN
INTRAMUSCULAR | Status: AC
Start: 2023-07-05 — End: ?
  Filled 2023-07-05: qty 2

## 2023-07-05 MED ORDER — HYDROCODONE-ACETAMINOPHEN 5-325 MG PO TABS: 1.0000 | ORAL_TABLET | Freq: Four times a day (QID) | ORAL | 0 refills | Status: DC | PRN

## 2023-07-05 MED ORDER — FENTANYL CITRATE (PF) 100 MCG/2ML IJ SOLN
INTRAMUSCULAR | Status: AC
  Filled 2023-07-05: qty 2

## 2023-07-05 MED ORDER — FLUORESCEIN SODIUM 10 % IV SOLN: 500.0000 mg | Freq: Once | INTRAVENOUS | Status: DC

## 2023-07-05 MED ORDER — GEMCITABINE CHEMO FOR BLADDER INSTILLATION 2000 MG
2000.0000 mg | Freq: Once | INTRAVENOUS | Status: DC
Start: 2023-07-05 — End: 2023-07-05
  Filled 2023-07-05: qty 52.6

## 2023-07-05 MED ORDER — FLUORESCEIN SODIUM 10 % IV SOLN
INTRAVENOUS | Status: AC
  Filled 2023-07-05: qty 5

## 2023-07-05 MED ORDER — LIDOCAINE HCL (CARDIAC) PF 100 MG/5ML IV SOSY
PREFILLED_SYRINGE | INTRAVENOUS | Status: DC | PRN
  Administered 2023-07-05: 100 mg via INTRAVENOUS

## 2023-07-05 MED ORDER — FLUORESCEIN SODIUM 10 % IV SOLN
INTRAVENOUS | Status: DC | PRN
  Administered 2023-07-05: 500 mg via INTRAVENOUS

## 2023-07-05 MED ORDER — LABETALOL HCL 5 MG/ML IV SOLN
INTRAVENOUS | Status: DC | PRN
  Administered 2023-07-05: 5 mg via INTRAVENOUS
  Administered 2023-07-05: 10 mg via INTRAVENOUS

## 2023-07-05 MED ORDER — FENTANYL CITRATE (PF) 100 MCG/2ML IJ SOLN: 25.0000 ug | INTRAMUSCULAR | Status: DC | PRN

## 2023-07-05 MED ORDER — FUROSEMIDE 10 MG/ML IJ SOLN
INTRAMUSCULAR | Status: DC | PRN
  Administered 2023-07-05: 20 mg via INTRAMUSCULAR

## 2023-07-05 MED ORDER — ROCURONIUM BROMIDE 100 MG/10ML IV SOLN
INTRAVENOUS | Status: DC | PRN
  Administered 2023-07-05: 50 mg via INTRAVENOUS
  Administered 2023-07-05 (×2): 20 mg via INTRAVENOUS

## 2023-07-05 MED ORDER — DEXAMETHASONE SODIUM PHOSPHATE 10 MG/ML IJ SOLN
INTRAMUSCULAR | Status: DC | PRN
  Administered 2023-07-05: 10 mg via INTRAVENOUS

## 2023-07-05 MED ORDER — CEFAZOLIN SODIUM-DEXTROSE 2-4 GM/100ML-% IV SOLN
INTRAVENOUS | Status: AC
  Filled 2023-07-05: qty 100

## 2023-07-05 MED ORDER — SUGAMMADEX SODIUM 200 MG/2ML IV SOLN
INTRAVENOUS | Status: DC | PRN
  Administered 2023-07-05: 200 mg via INTRAVENOUS

## 2023-07-05 MED ORDER — ONDANSETRON HCL 4 MG/2ML IJ SOLN
INTRAMUSCULAR | Status: DC | PRN
  Administered 2023-07-05: 4 mg via INTRAVENOUS

## 2023-07-05 MED ORDER — FENTANYL CITRATE (PF) 100 MCG/2ML IJ SOLN
INTRAMUSCULAR | Status: DC | PRN
Start: 2023-07-05 — End: 2023-07-05
  Administered 2023-07-05 (×4): 50 ug via INTRAVENOUS

## 2023-07-05 MED ORDER — OXYCODONE HCL 5 MG/5ML PO SOLN: 5.0000 mg | Freq: Once | ORAL | Status: DC | PRN

## 2023-07-05 MED ORDER — METOPROLOL TARTRATE 5 MG/5ML IV SOLN
INTRAVENOUS | Status: DC | PRN
  Administered 2023-07-05: 2 mg via INTRAVENOUS

## 2023-07-05 MED ORDER — CHLORHEXIDINE GLUCONATE 0.12 % MT SOLN
OROMUCOSAL | Status: AC
  Filled 2023-07-05: qty 15

## 2023-07-05 SURGICAL SUPPLY — 36 items
BAG DRAIN SIEMENS DORNER NS (MISCELLANEOUS) ×3 IMPLANT
BAG PRESSURE INF REUSE 3000 (BAG) ×3 IMPLANT
BAG URINE DRAIN 2000ML AR STRL (UROLOGICAL SUPPLIES) ×3 IMPLANT
BRUSH SCRUB EZ 1% IODOPHOR (MISCELLANEOUS) ×3 IMPLANT
BRUSH SCRUB EZ 4% CHG (MISCELLANEOUS) ×3 IMPLANT
CATH URET FLEX-TIP 2 LUMEN 10F (CATHETERS) ×3 IMPLANT
CATH URETL OPEN 5X70 (CATHETERS) ×3 IMPLANT
CATH URTH 16FR FL 2W BLN LF (CATHETERS) ×3 IMPLANT
CNTNR URN SCR LID CUP LEK RST (MISCELLANEOUS) ×3 IMPLANT
DRAPE UTILITY 15X26 TOWEL STRL (DRAPES) ×3 IMPLANT
DRSG TELFA 3X4 N-ADH STERILE (GAUZE/BANDAGES/DRESSINGS) ×3 IMPLANT
ELECT LOOP 22F BIPOLAR SML (ELECTROSURGICAL) IMPLANT
ELECT REM PT RETURN 9FT ADLT (ELECTROSURGICAL) IMPLANT
ELECTRODE LOOP 22F BIPOLAR SML (ELECTROSURGICAL) IMPLANT
ELECTRODE REM PT RTRN 9FT ADLT (ELECTROSURGICAL) IMPLANT
GLOVE BIO SURGEON STRL SZ 6.5 (GLOVE) ×3 IMPLANT
GOWN STRL REUS W/ TWL LRG LVL3 (GOWN DISPOSABLE) ×6 IMPLANT
GUIDEWIRE GREEN .038 145CM (MISCELLANEOUS) ×3 IMPLANT
GUIDEWIRE STR DUAL SENSOR (WIRE) ×6 IMPLANT
KIT TURNOVER CYSTO (KITS) ×3 IMPLANT
LOOP CUT BIPOLAR 24F LRG (ELECTROSURGICAL) IMPLANT
NDL SAFETY ECLIPSE 18X1.5 (NEEDLE) ×3 IMPLANT
PACK CYSTO AR (MISCELLANEOUS) ×3 IMPLANT
PAD ARMBOARD POSITIONER FOAM (MISCELLANEOUS) ×3 IMPLANT
SET CYSTO W/LG BORE CLAMP LF (SET/KITS/TRAYS/PACK) ×3 IMPLANT
SET IRRIG Y TYPE TUR BLADDER L (SET/KITS/TRAYS/PACK) ×3 IMPLANT
SHEATH NAVIGATOR HD 12/14X36 (SHEATH) ×3 IMPLANT
SOL .9 NS 3000ML IRR UROMATIC (IV SOLUTION) ×3 IMPLANT
STENT URET 6FRX24 CONTOUR (STENTS) IMPLANT
STENT URET 6FRX26 CONTOUR (STENTS) IMPLANT
SURGILUBE 2OZ TUBE FLIPTOP (MISCELLANEOUS) ×3 IMPLANT
SYR TOOMEY IRRIG 70ML (MISCELLANEOUS) ×3 IMPLANT
SYRINGE TOOMEY IRRIG 70ML (MISCELLANEOUS) ×3 IMPLANT
WATER STERILE IRR 1000ML POUR (IV SOLUTION) ×3 IMPLANT
WATER STERILE IRR 3000ML UROMA (IV SOLUTION) IMPLANT
WATER STERILE IRR 500ML POUR (IV SOLUTION) ×3 IMPLANT

## 2023-07-05 NOTE — Discharge Instructions (Signed)
Transurethral Resection of Bladder Tumor (TURBT) or Bladder Biopsy ° ° °Definition: ° Transurethral Resection of the Bladder Tumor is a surgical procedure used to diagnose and remove tumors within the bladder. TURBT is the most common treatment for early stage bladder cancer. ° °General instructions: °   ° Your recent bladder surgery requires very little post hospital care but some definite precautions. ° °Despite the fact that no skin incisions were used, the area around the bladder incisions are raw and covered with scabs to promote healing and prevent bleeding. Certain precautions are needed to insure that the scabs are not disturbed over the next 2-4 weeks while the healing proceeds. ° °Because the raw surface inside your bladder and the irritating effects of urine you may expect frequency of urination and/or urgency (a stronger desire to urinate) and perhaps even getting up at night more often. This will usually resolve or improve slowly over the healing period. You may see some blood in your urine over the first 6 weeks. Do not be alarmed, even if the urine was clear for a while. Get off your feet and drink lots of fluids until clearing occurs. If you start to pass clots or don't improve call us. ° °Diet: ° °You may return to your normal diet immediately. Because of the raw surface of your bladder, alcohol, spicy foods, foods high in acid and drinks with caffeine may cause irritation or frequency and should be used in moderation. To keep your urine flowing freely and avoid constipation, drink plenty of fluids during the day (8-10 glasses). Tip: Avoid cranberry juice because it is very acidic. ° °Activity: ° °Your physical activity doesn't need to be restricted. However, if you are very active, you may see some blood in the urine. We suggest that you reduce your activity under the circumstances until the bleeding has stopped. ° °Bowels: ° °It is important to keep your bowels regular during the postoperative  period. Straining with bowel movements can cause bleeding. A bowel movement every other day is reasonable. Use a mild laxative if needed, such as milk of magnesia 2-3 tablespoons, or 2 Dulcolax tablets. Call if you continue to have problems. If you had been taking narcotics for pain, before, during or after your surgery, you may be constipated. Take a laxative if necessary. ° ° ° °Medication: ° °You should resume your pre-surgery medications unless told not to. In addition you may be given an antibiotic to prevent or treat infection. Antibiotics are not always necessary. All medication should be taken as prescribed until the bottles are finished unless you are having an unusual reaction to one of the drugs. ° ° °[facility] °[place], [place] [address] °[phone] ° ° ° ° °

## 2023-07-05 NOTE — Transfer of Care (Signed)
 Immediate Anesthesia Transfer of Care Note  Patient: Kevin Glover  Procedure(s) Performed: CYSTOSCOPY, WITH RETROGRADE PYELOGRAM (Left) CYSTOSCOPY, WITH STENT INSERTION (Left) URETEROSCOPY (Left) TURBT (TRANSURETHRAL RESECTION OF BLADDER TUMOR) INSTILLATION, BLADDER  Patient Location: PACU  Anesthesia Type:General  Level of Consciousness: drowsy and patient cooperative  Airway & Oxygen Therapy: Patient Spontanous Breathing and Patient connected to face mask oxygen  Post-op Assessment: Report given to RN and Post -op Vital signs reviewed and stable  Post vital signs: stable, oral airway and face mask with 5L oxygen  Last Vitals:  Vitals Value Taken Time  BP 132/78 07/05/23 1536  Temp    Pulse 64 07/05/23 1538  Resp 8 07/05/23 1538  SpO2 96 % 07/05/23 1538  Vitals shown include unfiled device data.  Last Pain:  Vitals:   07/05/23 1220  TempSrc: Tympanic  PainSc: 0-No pain         Complications: No notable events documented.

## 2023-07-05 NOTE — Interval H&P Note (Signed)
 History and Physical Interval Note:  07/05/2023 1:00 PM  Kevin Glover  has presented today for surgery, with the diagnosis of Bladder Cancer.  The various methods of treatment have been discussed with the patient and family. After consideration of risks, benefits and other options for treatment, the patient has consented to  Procedure(s): CYSTOSCOPY, WITH RETROGRADE PYELOGRAM (Left) CYSTOSCOPY, WITH STENT INSERTION (Left) URETEROSCOPY (Left) TURBT (TRANSURETHRAL RESECTION OF BLADDER TUMOR) (N/A) INSTILLATION, BLADDER (N/A) as a surgical intervention.  The patient's history has been reviewed, patient examined, no change in status, stable for surgery.  I have reviewed the patient's chart and labs.  Questions were answered to the patient's satisfaction.    RRR CTAB  [name]

## 2023-07-05 NOTE — Op Note (Signed)
 Date of procedure: 07/05/23  Preoperative diagnosis:          1. Bladder mass        2. Left UVJ obstruction/ left hydronephrosis   Postoperative diagnosis:  same   Procedure: TURBT, large  Surgeon: [name], MD  Anesthesia: General  Complications: None  Intraoperative findings: Large nodule lower tumor measuring at least 6 cm involving from the mid trigone posteriorly all the way up to the mid lateral wall and to the bladder neck.  Over 45 minutes alone was spent trying to identify the left ureteral orifice unsuccessfully.  Given the extent of tumor and bleeding, elected not to place intravesical gemcitabine.  EBL: Minimal  Specimens: Bladder tumor  Drains: 18 French Foley catheter  Indication: Kevin Glover is a 83 y.o. patient with large infiltrative appearing bladder tumor identified on CT scan with associated left UVJ obstruction and hydroureteronephrosis.  After reviewing the management options for treatment, he elected to proceed with the above surgical procedure(s). We have discussed the potential benefits and risks of the procedure, side effects of the proposed treatment, the likelihood of the patient achieving the goals of the procedure, and any potential problems that might occur during the procedure or recuperation. Informed consent has been obtained.  Description of procedure:  The patient was taken to the operating room and general anesthesia was induced.  The patient was placed in the dorsal lithotomy position, prepped and draped in the usual sterile fashion, and preoperative antibiotics were administered. A preoperative time-out was performed.   The urethral meatus was dilated using male sounds.  A 26 French resectoscope was then advanced per urethra into the bladder.  Notably, the bladder neck was somewhat friable.  Attention was turned to the left side where there was a large bulging type mass that appeared to be very infiltrative and nodular.  In some areas,  the overlying mucosa appeared intact however was heaped up.  The left UO was not able to be visualized.  The tumor was quite extensive, measuring at least 6 cm or larger in diameter extending from the mid trigone posteriorly and up the lateral left wall all the way back to the trigone.  I then brought in a bipolar loop and using saline as the medium, I began to address the tumor.  Without using any sort of energy, there is 1 area of the tumor that appeared to be relatively soft and somewhat mushy.  I used the loop without energy just to scrape off this area and it appeared somewhat fluffy and necrotic with an unusual texture.  And then began to started the posterior most aspect of the tumor and resect towards the bladder neck.  The bladder wall itself was white thickened and irregular again with an infiltrative type tumor some of which was only submucosal.  I then ended up taking down a small portion of the bladder neck and proximal intravesical prostate in order to help visualize this area as well as resect this for possible involvement.  Once most visible tumor was resected, I turned my attention to find the left UO.  I spent at least 45 minutes careful inspecting the area and probing with a wire.  Fluorescein and Lasix were given and there was fairly prompt E flux from the right UO but never anything obvious from the left.  Ultimately, I was not successful in identifying the orifice and there is no ureteral stent was able to be placed.  Finally, all of the tumor  chips were evacuated from the bladder.  Careful and adequate hemostasis was then achieved using the bipolar loop.  1 last time after the entirety of the tumor bed was dried, I spent another 15 minutes looking for the orifice unsuccessfully.  Some of the resection was relatively deep all the way down to obvious muscle fibers, other areas the resection was also deep but there was a whitish sheath like appearance at the inferior margin which was somewhat  unusual.  There is other areas adjacent to the tumor which also appear to be thicken and it is unclear whether or not this represented residual tumor.  Overall, this was highly suspicious for either muscle invasive bladder cancer or some sort of other malignant process.  Hemostasis at this point was adequate the scope was removed.  I then placed a 18 French catheter with some difficulty at the prostate but ultimately was successful.  I irrigated the catheter several times and it was relatively bloody.  The balloon was filled with 30 cc of fluid.  I elected not to place the gemcitabine due to the extent of the resection, its depth as well as the amount of hematuria.  The catheter was then placed on tension, the patient was cleaned and dried, repositioned in the supine position, reversed of anesthesia and taken to the PACU in stable condition.  Plan: Intraoperative findings were discussed with his family members.  Will have a goals of care as well as pathology review next week.  Patient was seen in the PACU and his urine had cleared relatively well and as such, I did go ahead and have them discontinue his Foley.  [name], M.D.

## 2023-07-05 NOTE — Anesthesia Procedure Notes (Signed)
 Procedure Name: Intubation Date/Time: 07/05/2023 1:27 PM  Performed by: Maryla Morrow., CRNAPre-anesthesia Checklist: Patient identified, Patient being monitored, Timeout performed, Emergency Drugs available and Suction available Patient Re-evaluated:Patient Re-evaluated prior to induction Oxygen Delivery Method: Circle system utilized Preoxygenation: Pre-oxygenation with 100% oxygen Induction Type: IV induction Ventilation: Mask ventilation without difficulty Laryngoscope Size: 3 and McGrath Grade View: Grade I Tube type: Oral Tube size: 7.5 mm Number of attempts: 1 Airway Equipment and Method: Stylet Placement Confirmation: ETT inserted through vocal cords under direct vision, positive ETCO2 and breath sounds checked- equal and bilateral Secured at: 21 cm Tube secured with: Tape Dental Injury: Teeth and Oropharynx as per pre-operative assessment

## 2023-07-05 NOTE — Anesthesia Preprocedure Evaluation (Signed)
 Anesthesia Evaluation  Patient identified by MRN, date of birth, ID band Patient awake    Reviewed: Allergy & Precautions, NPO status , Patient's Chart, lab work & pertinent test results  History of Anesthesia Complications Negative for: history of anesthetic complications  Airway Mallampati: III  TM Distance: >3 FB Neck ROM: full    Dental  (+) Missing   Pulmonary shortness of breath and with exertion, COPD, former smoker   Pulmonary exam normal        Cardiovascular Exercise Tolerance: Good (-) angina + CAD, + Past MI and + Cardiac Stents  Normal cardiovascular exam+ dysrhythmias      Neuro/Psych negative neurological ROS  negative psych ROS   GI/Hepatic negative GI ROS, Neg liver ROS,neg GERD  ,,  Endo/Other  Hypothyroidism    Renal/GU Renal disease     Musculoskeletal   Abdominal   Peds  Hematology negative hematology ROS (+)   Anesthesia Other Findings Past Medical History: No date: Acquired hypothyroidism     Comment:  a.) secondary to XRT induced thyroid damage when vocal               cord treated 05/12/2017: Acute ST elevation myocardial infarction (STEMI) of  inferior wall (HCC)     Comment:  a.) LHC/PCI 05/12/2017: 99% m-dRCA (3.25 x 28 mm Xience               Sierra DES) No date: Aortic atherosclerosis (HCC) No date: Arthritis 04/2012: Aspiration pneumonia (HCC) No date: Bilateral eyelid ptosis; s/p repair No date: BPH (benign prostatic hyperplasia) No date: Chronic cough     Comment:  a.) secondary to "throat irritation" s/p XRT to vocal               cords No date: COPD (chronic obstructive pulmonary disease) (HCC) No date: Coronary artery disease     Comment:  a.) inf STEMI 05/12/2017 --> LHC: 50% oOM1-OM1, 40%               pLAD, 60% oD1, 99% m-dRCA (3.25 x 28 mm Xience Sierra               DES), 50% pRCA, 30% dRCA, 60% post atrio; b.) MV               01/09/2021: no ischemia No date:  Dyspnea No date: Exertional dyspnea No date: Full dentures No date: History of bilateral cataract extraction No date: Left upper lobe pulmonary nodule 03/28/2015: Localized cancer of vocal cord (HCC)     Comment:  a.) s/p XRT No date: Long-term use of aspirin therapy 05/26/2023: Malignant neoplasm of urinary bladder (HCC)     Comment:  a.) CT hematuria workup 05/26/2023: LEFT               hydroureteronephrosis due to plaque-like wall thickening               involving the LEFT bladder wall and UVJ; highly               suspicious for bladder carcinoma No date: Pure hypercholesterolemia No date: Renal cyst No date: 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 No date: Schmorl's nodes of lumbar region No date: Vocal cord polyp  Past Surgical History: No date: BACK SURGERY 09/22/2016: BROW LIFT; Bilateral     Comment:  Procedure: BLEPHAROPLASTY;  Surgeon: Imagene Riches, MD;               Location:  [facility];  Service: Ophthalmology;                Laterality: Bilateral; 06/02/2016: CATARACT EXTRACTION W/PHACO; Right     Comment:  Procedure: CATARACT EXTRACTION PHACO AND INTRAOCULAR               LENS PLACEMENT (IOC) right;  Surgeon: [place] Crane,               MD;  Location: [facility];  Service:               Ophthalmology;  Laterality: Right; 06/30/2016: CATARACT EXTRACTION W/PHACO; Left     Comment:  Procedure: CATARACT EXTRACTION PHACO AND INTRAOCULAR               LENS PLACEMENT (IOC)  Left;  Surgeon: [place] Crane,               MD;  Location: [facility];  Service:               Ophthalmology;  Laterality: Left; 05/12/2017: CORONARY/GRAFT ACUTE MI REVASCULARIZATION; N/A     Comment:  Procedure: Coronary/Graft Acute MI Revascularization;                Surgeon: Marcina Millard, MD;  Location: [facility]               INVASIVE CV LAB;  Service: Cardiovascular;  Laterality:               N/A; 06/03/2018: CYSTOSCOPY  WITH BIOPSY; N/A     Comment:  Procedure: CYSTOSCOPY WITH bladder BIOPSY;  Surgeon:               Sondra Come, MD;  Location: [facility];  Service:               Urology;  Laterality: N/A; 06/03/2018: CYSTOSCOPY WITH FULGERATION; N/A     Comment:  Procedure: CYSTOSCOPY WITH FULGERATION;  Surgeon:               Sondra Come, MD;  Location: [facility];  Service:               Urology;  Laterality: N/A; No date: HERNIA REPAIR 09/02/2017: KNEE ARTHROSCOPY WITH MEDIAL MENISECTOMY; Right     Comment:  Procedure: KNEE ARTHROSCOPY WITH PARTIAL MEDIAL AND               LATERAL MENISECTOMY, PARTIAL SYNOVECTOMY;  Surgeon: Kennedy Bucker, MD;  Location: [facility];  Service: Orthopedics;               Laterality: Right; 05/12/2017: LEFT HEART CATH AND CORONARY ANGIOGRAPHY; N/A     Comment:  Procedure: LEFT HEART CATH AND CORONARY ANGIOGRAPHY;                Surgeon: Marcina Millard, MD;  Location: [facility]               INVASIVE CV LAB;  Service: Cardiovascular;  Laterality:               N/A; No date: MICROLARYNGOSCOPY     Comment:  3-4 yrs ago 03/22/2015: MICROLARYNGOSCOPY; Left     Comment:  Procedure: MICROLARYNGOSCOPY WITH EXCISION V CORD LEFT               AND BIOPSY;  Surgeon: Linus Salmons, MD;  Location:  [facility];  Service: ENT;  Laterality: Left; 09/22/2016: PTOSIS REPAIR; Bilateral     Comment:  Procedure: PTOSIS REPAIR;  Surgeon: Imagene Riches, MD;                Location: [facility];  Service: Ophthalmology;                Laterality: Bilateral; No date: VEIN LIGATION; Left  BMI    Body Mass Index: 25.80 kg/m      Reproductive/Obstetrics negative OB ROS                             Anesthesia Physical Anesthesia Plan  ASA: 3  Anesthesia Plan: General ETT   Post-op Pain Management:    Induction: Intravenous  PONV Risk Score and Plan: Ondansetron, Dexamethasone, Midazolam and Treatment may vary  due to age or medical condition  Airway Management Planned: Oral ETT  Additional Equipment:   Intra-op Plan:   Post-operative Plan: Extubation in OR  Informed Consent: I have reviewed the patients History and Physical, chart, labs and discussed the procedure including the risks, benefits and alternatives for the proposed anesthesia with the patient or authorized representative who has indicated his/her understanding and acceptance.     Dental Advisory Given  Plan Discussed with: Anesthesiologist, CRNA and Surgeon  Anesthesia Plan Comments: (Patient consented for risks of anesthesia including but not limited to:  - adverse reactions to medications - damage to eyes, teeth, lips or other oral mucosa - nerve damage due to positioning  - sore throat or hoarseness - Damage to heart, brain, nerves, lungs, other parts of body or loss of life  Patient voiced understanding and assent.)       Anesthesia Quick Evaluation

## 2023-07-06 ENCOUNTER — Ambulatory Visit: Admitting: Physician Assistant

## 2023-07-06 ENCOUNTER — Encounter: Payer: Self-pay | Admitting: Urology

## 2023-07-06 VITALS — BP 162/91 | HR 108 | Ht 71.0 in | Wt 185.0 lb

## 2023-07-06 DIAGNOSIS — C679 Malignant neoplasm of bladder, unspecified: Secondary | ICD-10-CM | POA: Diagnosis not present

## 2023-07-06 DIAGNOSIS — R338 Other retention of urine: Secondary | ICD-10-CM | POA: Diagnosis not present

## 2023-07-06 LAB — URINALYSIS, COMPLETE
Bilirubin, UA: NEGATIVE
Glucose, UA: NEGATIVE
Leukocytes,UA: NEGATIVE
Nitrite, UA: POSITIVE — AB
Specific Gravity, UA: 1.025 (ref 1.005–1.030)
Urobilinogen, Ur: 1 mg/dL (ref 0.2–1.0)
pH, UA: 6 (ref 5.0–7.5)

## 2023-07-06 LAB — MICROSCOPIC EXAMINATION: RBC, Urine: >30 /HPF — AB (ref 0–2)

## 2023-07-06 LAB — BLADDER SCAN AMB NON-IMAGING: Scan Result: 741

## 2023-07-06 NOTE — Progress Notes (Signed)
 07/06/2023 11:08 AM   Kevin Glover 11/20/1940 [id number]  CC: Chief Complaint  Patient presents with   Other   HPI: Kevin Glover is a 83 y.o. male with PMH BPH with prostatomegaly, elevated PSA, and bladder mass with left UVJ obstruction causing left hydronephrosis who underwent TURBT with Dr. Apolinar Junes yesterday who presents today for evaluation of possible acute urinary retention.   Today he reports he voided twice after returning home yesterday, then lost the ability to urinate. He was having some gross hematuria but passing only small clots. He is uncomfortable today.  PVR .  PMH: Past Medical History:  Diagnosis Date   Acquired hypothyroidism    a.) secondary to XRT induced thyroid damage when vocal cord treated   Acute ST elevation myocardial infarction (STEMI) of inferior wall (HCC) 05/12/2017   a.) LHC/PCI 05/12/2017: 99% m-dRCA (3.25 x 28 mm Xience Sierra DES)   Aortic atherosclerosis (HCC)    Arthritis    Aspiration pneumonia (HCC) 04/2012   Bilateral eyelid ptosis; s/p repair    BPH (benign prostatic hyperplasia)    Chronic cough    a.) secondary to "throat irritation" s/p XRT to vocal cords   COPD (chronic obstructive pulmonary disease) (HCC)    Coronary artery disease    a.) inf STEMI 05/12/2017 --> LHC: 50% oOM1-OM1, 40% pLAD, 60% oD1, 99% m-dRCA (3.25 x 28 mm Xience Sierra DES), 50% pRCA, 30% dRCA, 60% post atrio; b.) MV 01/09/2021: no ischemia   Dyspnea    Exertional dyspnea    Full dentures    History of bilateral cataract extraction    Left upper lobe pulmonary nodule    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   a.) s/p XRT   Long-term use of aspirin therapy    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   a.) CT hematuria workup 05/26/2023: LEFT hydroureteronephrosis due to plaque-like wall thickening involving the LEFT bladder wall and UVJ; highly suspicious for bladder carcinoma   Pure hypercholesterolemia    Renal cyst     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    Schmorl's nodes of lumbar region    Vocal cord polyp     Surgical History: Past Surgical History:  Procedure Laterality Date   BACK SURGERY     BROW LIFT Bilateral 09/22/2016   Procedure: BLEPHAROPLASTY;  Surgeon: Imagene Riches, MD;  Location: [facility];  Service: Ophthalmology;  Laterality: Bilateral;   CATARACT EXTRACTION W/PHACO Right 06/02/2016   Procedure: CATARACT EXTRACTION PHACO AND INTRAOCULAR LENS PLACEMENT (IOC) right;  Surgeon: Nevada Crane, MD;  Location: [facility];  Service: Ophthalmology;  Laterality: Right;   CATARACT EXTRACTION W/PHACO Left 06/30/2016   Procedure: CATARACT EXTRACTION PHACO AND INTRAOCULAR LENS PLACEMENT (IOC)  Left;  Surgeon: Nevada Crane, MD;  Location: [facility];  Service: Ophthalmology;  Laterality: Left;   CORONARY/GRAFT ACUTE MI REVASCULARIZATION N/A 05/12/2017   Procedure: Coronary/Graft Acute MI Revascularization;  Surgeon: Marcina Millard, MD;  Location: [facility];  Service: Cardiovascular;  Laterality: N/A;   CYSTOSCOPY WITH BIOPSY N/A 06/03/2018   Procedure: CYSTOSCOPY WITH bladder BIOPSY;  Surgeon: Sondra Come, MD;  Location: [facility] OR[facility];  Service: Urology;  Laterality: N/A;   CYSTOSCOPY WITH FULGERATION N/A 06/03/2018   Procedure: CYSTOSCOPY WITH FULGERATION;  Surgeon: Sondra Come, MD;  Location: [facility];  Service: Urology;  Laterality: N/A;   HERNIA REPAIR     KNEE ARTHROSCOPY WITH MEDIAL  MENISECTOMY Right 09/02/2017   Procedure: KNEE ARTHROSCOPY WITH PARTIAL MEDIAL AND LATERAL MENISECTOMY, PARTIAL SYNOVECTOMY;  Surgeon: Kennedy Bucker, MD;  Location: [facility];  Service: Orthopedics;  Laterality: Right;   LEFT HEART CATH AND CORONARY ANGIOGRAPHY N/A 05/12/2017   Procedure: LEFT HEART CATH AND CORONARY ANGIOGRAPHY;  Surgeon: Marcina Millard, MD;  Location: [facility];  Service: Cardiovascular;   Laterality: N/A;   MICROLARYNGOSCOPY     3-4 yrs ago   MICROLARYNGOSCOPY Left 03/22/2015   Procedure: MICROLARYNGOSCOPY WITH EXCISION V CORD LEFT AND BIOPSY;  Surgeon: Linus Salmons, MD;  Location: [facility];  Service: ENT;  Laterality: Left;   PTOSIS REPAIR Bilateral 09/22/2016   Procedure: PTOSIS REPAIR;  Surgeon: Imagene Riches, MD;  Location: [facility];  Service: Ophthalmology;  Laterality: Bilateral;   VEIN LIGATION Left     Home Medications:  Allergies as of 07/06/2023   No Known Allergies      Medication List        Accurate as of July 06, 2023 11:08 AM. If you have any questions, ask your nurse or doctor.          STOP taking these medications    atorvastatin 80 MG tablet Commonly known as: LIPITOR   metoprolol tartrate 25 MG tablet Commonly known as: LOPRESSOR       TAKE these medications    aspirin 81 MG chewable tablet Chew 1 tablet (81 mg total) by mouth daily.   HYDROcodone-acetaminophen 5-325 MG tablet Commonly known as: NORCO/VICODIN Take 1-2 tablets by mouth every 6 (six) hours as needed for moderate pain (pain score 4-6).   levothyroxine 25 MCG tablet Commonly known as: SYNTHROID Take 25 mcg by mouth daily before breakfast.   rosuvastatin 5 MG tablet Commonly known as: CRESTOR Take 5 mg by mouth daily.   Trelegy Ellipta 100-62.5-25 MCG/ACT Aepb Generic drug: Fluticasone-Umeclidin-Vilant Inhale 1 puff into the lungs daily as needed (cough).        Allergies:  No Known Allergies  Family History: Family History  Problem Relation Age of Onset   Cancer Mother        Melanoma   Prolactinoma Neg Hx    Prostate cancer Neg Hx    Kidney cancer Neg Hx     Social History:   reports that he quit smoking about 35 years ago. His smoking use included cigarettes. He has never used smokeless tobacco. He reports current alcohol use of about 1.0 - 2.0 standard drink of alcohol per week. He reports that he does not use  drugs.  Physical Exam: BP (!) 162/91   Pulse (!) 108   Ht 5\' 11"  (1.803 m)   Wt 185 lb (83.9 kg)   BMI 25.80 kg/m   Constitutional:  Alert and oriented, no acute distress, nontoxic appearing HEENT: [place], AT Cardiovascular: No clubbing, cyanosis, or edema Respiratory: Normal respiratory effort, no increased work of breathing Skin: No rashes, bruises or suspicious lesions Neurologic: Grossly intact, no focal deficits, moving all 4 extremities Psychiatric: Normal mood and affect  Laboratory Data: Results for orders placed or performed in visit on 07/06/23  Bladder Scan (Post Void Residual) in office   Collection Time: 07/06/23 11:06 AM  Result Value Ref Range   Scan Result 741    Simple Catheter Placement  Due to urinary retention patient is present today for a foley cath placement.  Patient was cleaned and prepped in a sterile fashion with betadine and 2% lidocaine jelly was instilled into the  urethra. A 18 FR coude foley catheter was inserted, urine return was noted  , urine was brown in color.  The balloon was filled with 10cc of sterile water.  A night bag was attached for drainage. Patient was given instruction on proper catheter care.  Patient tolerated well, no complications were noted   Performed by: Carman Ching, PA-C   Assessment & Plan:   1. Acute urinary retention (Primary) Postop urinary retention in the setting of BPH.  I offered him Foley placement and he accepted, see above.  Will plan to keep catheter in place for about a week for bladder decompression and perform a voiding trial on the same day as his postop follow-up with Dr. Apolinar Junes.  UA today with hematuria, but otherwise low suspicion for infection.  Will send for culture out of an abundance of caution but defer antibiotics for now. - Bladder Scan (Post Void Residual) in office - Urinalysis, Complete - CULTURE, URINE COMPREHENSIVE   Return in about 8 days (around 07/14/2023) for Voiding trial and  postop f/u with Dr. Apolinar Junes.  Carman Ching, PA-C  [facility] [address], [address] [place], [place] [address] [phone]

## 2023-07-06 NOTE — Patient Instructions (Signed)

## 2023-07-08 ENCOUNTER — Encounter: Payer: Self-pay | Admitting: Urology

## 2023-07-08 ENCOUNTER — Telehealth: Payer: Self-pay

## 2023-07-08 ENCOUNTER — Ambulatory Visit: Admitting: Urology

## 2023-07-08 VITALS — BP 131/77 | HR 89 | Ht 64.0 in | Wt 185.0 lb

## 2023-07-08 DIAGNOSIS — N3289 Other specified disorders of bladder: Secondary | ICD-10-CM

## 2023-07-08 DIAGNOSIS — R338 Other retention of urine: Secondary | ICD-10-CM | POA: Diagnosis not present

## 2023-07-08 MED ORDER — CEFTRIAXONE SODIUM 1 G IJ SOLR
500.0000 mg | Freq: Once | INTRAMUSCULAR | Status: AC
Start: 2023-07-08 — End: 2023-07-08
  Administered 2023-07-08: 500 mg via INTRAMUSCULAR

## 2023-07-08 MED ORDER — CEFTRIAXONE SODIUM 500 MG IJ SOLR: 500.0000 mg | Freq: Once | INTRAMUSCULAR | Status: DC

## 2023-07-08 NOTE — Telephone Encounter (Signed)
 Pt and wife call triage line, wife states patient had surgery Monday and subsequently went into retention and had a foley placed in office on Tuesday. Since having catheter in he has had 1 episode of bladder spasm with urine coming around the catheter and some small clots. Today they state that he has passed a large clot and no urine has drained into the bag since that time. Consulted with PA in office, pt scheduled to come into clinic for same day evaluation.

## 2023-07-08 NOTE — Progress Notes (Signed)
 07/08/2023 1:28 PM   Kevin ROGERSON [birth year] [id number]  Referring provider: Barbette Reichmann, MD [address] [facility] [place],  [place] [address]  Urological history: 1. Bladder tumor -TURBT (05/2023) -pathology still pending -Bladder biopsy and fulguration (05/2018) -pathology for benign urothelial tissue with epithelial ulceration, chronic inflammation and hyperemia but no evidence of atypia or malignancy  2. Elevated PSA -PSA (04/2023) 8.2  3. BPH with LU TS   4. Left hydronephrosis -left UO obscured by tumor -Serum creatinine stable  Chief Complaint  Patient presents with   clot   HPI: Kevin Glover is a 83 y.o. male who presents today for clot retention.   Previous records reviewed.   His wife called the triage line this morning stating that he has been having bladder spasm and then passed a large clot today and no urine has drained into the bag since that time.  He states the urine has been bloody.  Patient denies any modifying or aggravating factors.  Patient denies any recent UTI's, dysuria or suprapubic/flank pain.  Patient denies any fevers, chills, nausea or vomiting.    PMH: Past Medical History:  Diagnosis Date   Acquired hypothyroidism    a.) secondary to XRT induced thyroid damage when vocal cord treated   Acute ST elevation myocardial infarction (STEMI) of inferior wall (HCC) 05/12/2017   a.) LHC/PCI 05/12/2017: 99% m-dRCA (3.25 x 28 mm Xience Sierra DES)   Aortic atherosclerosis (HCC)    Arthritis    Aspiration pneumonia (HCC) 04/2012   Bilateral eyelid ptosis; s/p repair    BPH (benign prostatic hyperplasia)    Chronic cough    a.) secondary to "throat irritation" s/p XRT to vocal cords   COPD (chronic obstructive pulmonary disease) (HCC)    Coronary artery disease    a.) inf STEMI 05/12/2017 --> LHC: 50% oOM1-OM1, 40% pLAD, 60% oD1, 99% m-dRCA (3.25 x 28 mm Xience Sierra DES), 50% pRCA, 30% dRCA, 60% post atrio; b.) MV  01/09/2021: no ischemia   Dyspnea    Exertional dyspnea    Full dentures    History of bilateral cataract extraction    Left upper lobe pulmonary nodule    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   a.) s/p XRT   Long-term use of aspirin therapy    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   a.) CT hematuria workup 05/26/2023: LEFT hydroureteronephrosis due to plaque-like wall thickening involving the LEFT bladder wall and UVJ; highly suspicious for bladder carcinoma   Pure hypercholesterolemia    Renal cyst    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    Schmorl's nodes of lumbar region    Vocal cord polyp     Surgical History: Past Surgical History:  Procedure Laterality Date   BACK SURGERY     BROW LIFT Bilateral 09/22/2016   Procedure: BLEPHAROPLASTY;  Surgeon: Imagene Riches, MD;  Location: [facility];  Service: Ophthalmology;  Laterality: Bilateral;   CATARACT EXTRACTION W/PHACO Right 06/02/2016   Procedure: CATARACT EXTRACTION PHACO AND INTRAOCULAR LENS PLACEMENT (IOC) right;  Surgeon: Nevada Crane, MD;  Location: [facility];  Service: Ophthalmology;  Laterality: Right;   CATARACT EXTRACTION W/PHACO Left 06/30/2016   Procedure: CATARACT EXTRACTION PHACO AND INTRAOCULAR LENS PLACEMENT (IOC)  Left;  Surgeon: Nevada Crane, MD;  Location: [facility];  Service: Ophthalmology;  Laterality: Left;   CORONARY/GRAFT ACUTE MI REVASCULARIZATION N/A 05/12/2017   Procedure:  Coronary/Graft Acute MI Revascularization;  Surgeon: Marcina Millard, MD;  Location: [facility];  Service: Cardiovascular;  Laterality: N/A;   CYSTOSCOPY WITH BIOPSY N/A 06/03/2018   Procedure: CYSTOSCOPY WITH bladder BIOPSY;  Surgeon: Sondra Come, MD;  Location: [facility];  Service: Urology;  Laterality: N/A;   CYSTOSCOPY WITH FULGERATION N/A 06/03/2018   Procedure: CYSTOSCOPY WITH FULGERATION;  Surgeon: Sondra Come, MD;  Location: [facility];  Service: Urology;  Laterality: N/A;   CYSTOSCOPY/URETEROSCOPY/HOLMIUM LASER/STENT PLACEMENT  07/05/2023   Procedure: CYSTOSCOPY/URETEROSCOPY;  Surgeon: [name], MD;  Location: [facility];  Service: Urology;;   HERNIA REPAIR     KNEE ARTHROSCOPY WITH MEDIAL MENISECTOMY Right 09/02/2017   Procedure: KNEE ARTHROSCOPY WITH PARTIAL MEDIAL AND LATERAL MENISECTOMY, PARTIAL SYNOVECTOMY;  Surgeon: Kennedy Bucker, MD;  Location: [facility];  Service: Orthopedics;  Laterality: Right;   LEFT HEART CATH AND CORONARY ANGIOGRAPHY N/A 05/12/2017   Procedure: LEFT HEART CATH AND CORONARY ANGIOGRAPHY;  Surgeon: Marcina Millard, MD;  Location: [facility];  Service: Cardiovascular;  Laterality: N/A;   MICROLARYNGOSCOPY     3-4 yrs ago   MICROLARYNGOSCOPY Left 03/22/2015   Procedure: MICROLARYNGOSCOPY WITH EXCISION V CORD LEFT AND BIOPSY;  Surgeon: Linus Salmons, MD;  Location: [facility];  Service: ENT;  Laterality: Left;   PTOSIS REPAIR Bilateral 09/22/2016   Procedure: PTOSIS REPAIR;  Surgeon: Imagene Riches, MD;  Location: [facility];  Service: Ophthalmology;  Laterality: Bilateral;   TRANSURETHRAL RESECTION OF BLADDER TUMOR N/A 07/05/2023   Procedure: TURBT (TRANSURETHRAL RESECTION OF BLADDER TUMOR);  Surgeon: [name], MD;  Location: [facility];  Service: Urology;  Laterality: N/A;   URETEROSCOPY Left 07/05/2023   Procedure: URETEROSCOPY;  Surgeon: [name], MD;  Location: [facility];  Service: Urology;  Laterality: Left;   VEIN LIGATION Left     Home Medications:  Allergies as of 07/08/2023   No Known Allergies      Medication List        Accurate as of July 08, 2023  1:28 PM. If you have any questions, ask your nurse or doctor.          aspirin 81 MG chewable tablet Chew 1 tablet (81 mg total) by mouth daily.   HYDROcodone-acetaminophen 5-325 MG tablet Commonly known as: NORCO/VICODIN Take 1-2 tablets by mouth every  6 (six) hours as needed for moderate pain (pain score 4-6).   levothyroxine 25 MCG tablet Commonly known as: SYNTHROID Take 25 mcg by mouth daily before breakfast.   rosuvastatin 5 MG tablet Commonly known as: CRESTOR Take 5 mg by mouth daily.   Trelegy Ellipta 100-62.5-25 MCG/ACT Aepb Generic drug: Fluticasone-Umeclidin-Vilant Inhale 1 puff into the lungs daily as needed (cough).        Allergies: No Known Allergies  Family History: Family History  Problem Relation Age of Onset   Cancer Mother        Melanoma   Prolactinoma Neg Hx    Prostate cancer Neg Hx    Kidney cancer Neg Hx     Social History:  reports that he quit smoking about 35 years ago. His smoking use included cigarettes. He has never used smokeless tobacco. He reports current alcohol use of about 1.0 - 2.0 standard drink of alcohol per week. He reports that he does not use drugs.  ROS: Pertinent ROS in HPI  Physical Exam: BP 131/77   Pulse 89   Ht 5\' 4"  (1.626 m)   Wt 185 lb (  83.9 kg)   BMI 31.76 kg/m   Constitutional:  Well nourished. Alert and oriented, No acute distress. HEENT: [place] AT, moist mucus membranes.  Trachea midline Cardiovascular: No clubbing, cyanosis, or edema. Respiratory: Normal respiratory effort, no increased work of breathing. GU: No CVA tenderness.  No bladder fullness or masses.  Patient with uncircumcised phallus.  Foreskin easily retracted  Urethral meatus is patent.  No penile discharge. No penile lesions or rashes.  Neurologic: Grossly intact, no focal deficits, moving all 4 extremities. Psychiatric: Normal mood and affect.  Laboratory Data: CBC w/auto Differential (5 Part) Order: [id number] Component Ref Range & Units 2 mo ago  WBC (White Blood Cell Count) 4.1 - 10.2 10^3/uL 6.3  RBC (Red Blood Cell Count) 4.69 - 6.13 10^6/uL 4.86  Hemoglobin 14.1 - 18.1 gm/dL 40.9  Hematocrit 81.1 - 52.0 % 44.5  MCV (Mean Corpuscular Volume) 80.0 - 100.0 fl 91.6  MCH (Mean  Corpuscular Hemoglobin) 27.0 - 31.2 pg 30.2  MCHC (Mean Corpuscular Hemoglobin Concentration) 32.0 - 36.0 gm/dL 33  Platelet Count 914 - 450 10^3/uL 221  RDW-CV (Red Cell Distribution Width) 11.6 - 14.8 % 14  MPV (Mean Platelet Volume) 9.4 - 12.4 fl 9.9  Neutrophils 1.50 - 7.80 10^3/uL 4.14  Lymphocytes 1.00 - 3.60 10^3/uL 1.39  Monocytes 0.00 - 1.50 10^3/uL 0.57  Eosinophils 0.00 - 0.55 10^3/uL 0.15  Basophils 0.00 - 0.09 10^3/uL 0.04  Neutrophil % 32.0 - 70.0 % 65.7  Lymphocyte % 10.0 - 50.0 % 22.1  Monocyte % 4.0 - 13.0 % 9  Eosinophil % 1.0 - 5.0 % 2.4  Basophil% 0.0 - 2.0 % 0.6  Immature Granulocyte % <=0.7 % 0.2  Immature Granulocyte Count <=0.06 10^3/L 0.01  Resulting Agency [facility]   Specimen Collected: 05/04/23 07:07   Performed by: [facility] Last Resulted: 05/04/23 09:07  Received From: [facility]  Result Received: 05/11/23 13:55   Comprehensive Metabolic Panel (CMP) Order: [id number] Component Ref Range & Units 2 mo ago  Glucose 70 - 110 mg/dL 96  Sodium 086 - 578 mmol/L 141  Potassium 3.6 - 5.1 mmol/L 4.2  Chloride 97 - 109 mmol/L 105  Carbon Dioxide (CO2) 22.0 - 32.0 mmol/L 27.7  Urea Nitrogen (BUN) 7 - 25 mg/dL 17  Creatinine 0.7 - 1.3 mg/dL 0.9  Glomerular Filtration Rate (eGFR) >60 mL/min/1.73sq m 84  Comment: CKD-EPI (2021) does not include patient's race in the calculation of eGFR.  Monitoring changes of plasma creatinine and eGFR over time is useful for monitoring kidney function.  Interpretive Ranges for eGFR (CKD-EPI 2021):  eGFR:       >60 mL/min/1.73 sq. m - Normal eGFR:       30-59 mL/min/1.73 sq. m - Moderately Decreased eGFR:       15-29 mL/min/1.73 sq. m  - Severely Decreased eGFR:       < 15 mL/min/1.73 sq. m  - Kidney Failure   Note: These eGFR calculations do not apply in acute situations when eGFR is changing rapidly or patients on dialysis.  Calcium 8.7 - 10.3  mg/dL 9.8  AST 8 - 39 U/L 22  ALT 6 - 57 U/L 17  Alk Phos (alkaline Phosphatase) 34 - 104 U/L 78  Albumin 3.5 - 4.8 g/dL 4.6  Bilirubin, Total 0.3 - 1.2 mg/dL 0.6  Protein, Total 6.1 - 7.9 g/dL 7.2  A/G Ratio 1.0 - 5.0 gm/dL 1.8  Resulting Agency [facility]  Specimen Collected: 05/04/23 07:07   Performed by: [facility] Last Resulted: 05/04/23 12:51  Received From: [facility]  Result Received: 05/11/23 13:55    Hemoglobin A1C Order: [id number] Component Ref Range & Units 2 mo ago  Hemoglobin A1C 4.2 - 5.6 % 6.2 High   Average Blood Glucose (Calc) mg/dL 268  Resulting Agency [facility]  Narrative Performed by [facility] Normal Range:    4.2 - 5.6% Increased Risk:  5.7 - 6.4% Diabetes:        >= 6.5% Glycemic Control for adults with diabetes:  <7%    Specimen Collected: 05/04/23 07:07   Performed by: [facility] Last Resulted: 05/04/23 10:40  Received From: [facility]  Result Received: 05/11/23 13:55   Lipid Panel w/calc LDL Order: [id number] Component Ref Range & Units 2 mo ago  Cholesterol, Total 100 - 200 mg/dL 798  Triglyceride 35 - 199 mg/dL 921  HDL (High Density Lipoprotein) Cholesterol 29.0 - 71.0 mg/dL 19.4  LDL Calculated 0 - 130 mg/dL 94  VLDL Cholesterol mg/dL 21  Cholesterol/HDL Ratio 3.8  Resulting Agency [facility]   Specimen Collected: 05/04/23 07:07   Performed by: [facility] Last Resulted: 05/04/23 12:52  [facility]  Result Received: 05/11/23 13:55  I have reviewed the labs.   Pertinent Imaging: N/A  Cath Change/ Replacement Patient is present today for a catheter change due to urinary retention.  8 ml of water was removed from the balloon, a 18 FR Coude foley cath was removed without difficulty.  Patient was cleaned and prepped in a sterile fashion with  betadine and 2% lidocaine jelly was instilled into the urethra. A 20  FR foley cath was replaced into the bladder, no complications were noted. Urine return was noted 300 ml and urine was dark red in color. The balloon was filled with 10ml of sterile water. A night bag was attached for drainage.  A Patient was given proper instruction on catheter care.    Bladder Irrigation Due to clot retention patient is present today for a bladder irrigation. Patient was cleaned and prepped in a sterile fashion. 1000 mL of saline/sterile water was instilled into the bladder with a 70mL [place]y syringe through the catheter in place.  1000 mL of urine return was cleared from the bladder with evacuation of 10 ccs of old clot material. Efflux cleared from dark red to translucent dark pink/orange with the procedure and the catheter irrigated . Upon completion, the catheter was draining well and was reattached to the night bag for drainage. Patient tolerated well.   Performed by: Michiel Cowboy, PA-C and Benay Pike, CMA   Assessment & Plan:    1. Clot retention -Patient's Foley catheter exchanged and irrigated to remove about 10 cc of old clot material urine was translucent dark pink/orange when completing the procedure -He was given irrigation tray and instructed on self irrigation if need be over the weekend -He was given 1 g Rocephin here in the office -He has a follow-up next week for voiding trial  2. Bladder Tumor -s/p TURBT (07/06/2023) pathology still pending  -has follow up next to hopefully discuss pathology results   Return in about 1 week (around 07/15/2023) for Trial of Void/Office visit with Dr. Apolinar Junes .  These notes generated with voice recognition software. I apologize for typographical errors.  Toneisha Savary,  PA-C  [facility] [address]  [address] [place], [place] [address] [phone]

## 2023-07-09 LAB — CULTURE, URINE COMPREHENSIVE

## 2023-07-12 LAB — SURGICAL PATHOLOGY

## 2023-07-14 ENCOUNTER — Ambulatory Visit: Payer: Medicare HMO | Admitting: Urology

## 2023-07-14 ENCOUNTER — Ambulatory Visit (INDEPENDENT_AMBULATORY_CARE_PROVIDER_SITE_OTHER): Admitting: Urology

## 2023-07-14 ENCOUNTER — Ambulatory Visit: Admitting: Urology

## 2023-07-14 VITALS — BP 150/85 | HR 88

## 2023-07-14 DIAGNOSIS — N1330 Unspecified hydronephrosis: Secondary | ICD-10-CM

## 2023-07-14 DIAGNOSIS — N9989 Other postprocedural complications and disorders of genitourinary system: Secondary | ICD-10-CM

## 2023-07-14 DIAGNOSIS — N3289 Other specified disorders of bladder: Secondary | ICD-10-CM | POA: Diagnosis not present

## 2023-07-14 DIAGNOSIS — R338 Other retention of urine: Secondary | ICD-10-CM

## 2023-07-14 LAB — BLADDER SCAN AMB NON-IMAGING: Scan Result: 20

## 2023-07-14 NOTE — Progress Notes (Signed)
 [name],acting as a scribe for [name], MD.,have documented all relevant documentation on the behalf of [name], MD,as directed by  [name], MD while in the presence of [name], MD.  07/14/23 1:45 PM   Kevin Glover [birth year] [id number]  Referring provider: Barbette Reichmann, MD [address] [facility] [place],  [place] [address]  Chief Complaint  Patient presents with   Follow-up    HPI: 83 y/o male who presents today for follow-up after a large bladder mass was identified on CT scan, along with left hydronephrosis.   Intraoperatively, a large nodular tumor, at least six centimeters, was noted, and the left ureteral orifice (UO) was not identified. He is unstented. He returned the next day with small clots in retention, and a Foley catheter was placed.   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   He is asymptomatic from a urologic perspective, with no bladder symptoms reported   PMH: Past Medical History:  Diagnosis Date   Acquired hypothyroidism    a.) secondary to XRT induced thyroid damage when vocal cord treated   Acute ST elevation myocardial infarction (STEMI) of inferior wall (HCC) 05/12/2017   a.) LHC/PCI 05/12/2017: 99% m-dRCA (3.25 x 28 mm Xience Sierra DES)   Aortic atherosclerosis (HCC)    Arthritis    Aspiration pneumonia (HCC) 04/2012   Bilateral eyelid ptosis; s/p repair    BPH (benign prostatic hyperplasia)    Chronic cough    a.) secondary to "throat irritation" s/p XRT to vocal cords   COPD (chronic obstructive pulmonary disease) (HCC)    Coronary artery disease    a.) inf STEMI 05/12/2017 --> LHC: 50% oOM1-OM1, 40% pLAD, 60% oD1, 99% m-dRCA (3.25 x 28 mm Xience Sierra DES), 50% pRCA, 30% dRCA, 60% post atrio; b.) MV 01/09/2021: no ischemia    Dyspnea    Exertional dyspnea    Full dentures    History of bilateral cataract extraction    Left upper lobe pulmonary nodule    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   a.) s/p XRT   Long-term use of aspirin therapy    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   a.) CT hematuria workup 05/26/2023: LEFT hydroureteronephrosis due to plaque-like wall thickening involving the LEFT bladder wall and UVJ; highly suspicious for bladder carcinoma   Pure hypercholesterolemia    Renal cyst    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    Schmorl's nodes of lumbar region    Vocal cord polyp     Surgical History: Past Surgical History:  Procedure Laterality Date   BACK SURGERY     BROW LIFT Bilateral 09/22/2016   Procedure: BLEPHAROPLASTY;  Surgeon: Imagene Riches, MD;  Location: [facility];  Service: Ophthalmology;  Laterality: Bilateral;   CATARACT EXTRACTION W/PHACO Right 06/02/2016   Procedure: CATARACT EXTRACTION PHACO AND INTRAOCULAR LENS PLACEMENT (IOC) right;  Surgeon: Nevada Crane, MD;  Location: [facility];  Service: Ophthalmology;  Laterality: Right;   CATARACT EXTRACTION W/PHACO Left 06/30/2016   Procedure: CATARACT EXTRACTION PHACO AND INTRAOCULAR LENS PLACEMENT (IOC)  Left;  Surgeon: Nevada Crane, MD;  Location: [facility];  Service: Ophthalmology;  Laterality: Left;   CORONARY/GRAFT ACUTE MI REVASCULARIZATION N/A 05/12/2017   Procedure: Coronary/Graft Acute MI Revascularization;  Surgeon: Marcina Millard, MD;  Location: [facility] INVASIVE  CV LAB;  Service: Cardiovascular;  Laterality: N/A;   CYSTOSCOPY WITH BIOPSY N/A 06/03/2018   Procedure: CYSTOSCOPY WITH bladder BIOPSY;  Surgeon: Sondra Come, MD;  Location: [facility] ORS;  Service: Urology;  Laterality: N/A;   CYSTOSCOPY WITH FULGERATION N/A 06/03/2018   Procedure: CYSTOSCOPY WITH FULGERATION;  Surgeon: Sondra Come, MD;  Location: [facility] ORS;   Service: Urology;  Laterality: N/A;   CYSTOSCOPY/URETEROSCOPY/HOLMIUM LASER/STENT PLACEMENT  07/05/2023   Procedure: CYSTOSCOPY/URETEROSCOPY;  Surgeon: [name], MD;  Location: [facility] ORS;  Service: Urology;;   HERNIA REPAIR     KNEE ARTHROSCOPY WITH MEDIAL MENISECTOMY Right 09/02/2017   Procedure: KNEE ARTHROSCOPY WITH PARTIAL MEDIAL AND LATERAL MENISECTOMY, PARTIAL SYNOVECTOMY;  Surgeon: Kennedy Bucker, MD;  Location: [facility] ORS;  Service: Orthopedics;  Laterality: Right;   LEFT HEART CATH AND CORONARY ANGIOGRAPHY N/A 05/12/2017   Procedure: LEFT HEART CATH AND CORONARY ANGIOGRAPHY;  Surgeon: Marcina Millard, MD;  Location: [facility] INVASIVE CV LAB;  Service: Cardiovascular;  Laterality: N/A;   MICROLARYNGOSCOPY     3-4 yrs ago   MICROLARYNGOSCOPY Left 03/22/2015   Procedure: MICROLARYNGOSCOPY WITH EXCISION V CORD LEFT AND BIOPSY;  Surgeon: Linus Salmons, MD;  Location: [facility];  Service: ENT;  Laterality: Left;   PTOSIS REPAIR Bilateral 09/22/2016   Procedure: PTOSIS REPAIR;  Surgeon: Imagene Riches, MD;  Location: [facility];  Service: Ophthalmology;  Laterality: Bilateral;   TRANSURETHRAL RESECTION OF BLADDER TUMOR N/A 07/05/2023   Procedure: TURBT (TRANSURETHRAL RESECTION OF BLADDER TUMOR);  Surgeon: [name], MD;  Location: [facility] ORS;  Service: Urology;  Laterality: N/A;   URETEROSCOPY Left 07/05/2023   Procedure: URETEROSCOPY;  Surgeon: [name], MD;  Location: [facility] ORS;  Service: Urology;  Laterality: Left;   VEIN LIGATION Left     Home Medications:  Allergies as of 07/14/2023   No Known Allergies      Medication List        Accurate as of July 14, 2023  1:45 PM. If you have any questions, ask your nurse or doctor.          aspirin 81 MG chewable tablet Chew 1 tablet (81 mg total) by mouth daily.   HYDROcodone-acetaminophen 5-325 MG tablet Commonly known as: NORCO/VICODIN Take 1-2 tablets by mouth every 6 (six) hours as needed for  moderate pain (pain score 4-6).   levothyroxine 25 MCG tablet Commonly known as: SYNTHROID Take 25 mcg by mouth daily before breakfast.   rosuvastatin 5 MG tablet Commonly known as: CRESTOR Take 5 mg by mouth daily.   Trelegy Ellipta 100-62.5-25 MCG/ACT Aepb Generic drug: Fluticasone-Umeclidin-Vilant Inhale 1 puff into the lungs daily as needed (cough).        Family History: Family History  Problem Relation Age of Onset   Cancer Mother        Melanoma   Prolactinoma Neg Hx    Prostate cancer Neg Hx    Kidney cancer Neg Hx     Social History:  reports that he quit smoking about 35 years ago. His smoking use included cigarettes. He has never used smokeless tobacco. He reports current alcohol use of about 1.0 - 2.0 standard drink of alcohol per week. He reports that he does not use drugs.   Physical Exam: BP (!) 150/85   Pulse 88   Constitutional:  Alert and oriented, No acute distress.  Family meeting today with 4 additional family members. HEENT: [place] AT, moist mucus membranes.  Trachea midline, no masses. Neurologic: Grossly intact, no  focal deficits, moving all 4 extremities. Psychiatric: Normal mood and affect.   Assessment & Plan:    1. Bladder Mass with Amyloidosis - Pathology consistent with amyloid deposition, no carcinoma.  - Refer to the amyloid clinic at [facility] for further evaluation and management. The clinic will assess for involvement of other organs and potential treatment options.  2. Left Hydronephrosis - Plan to obtain a repeat renal ultrasound to assess for resolution of hydronephrosis following bladder mass resection.  - If hydronephrosis persists, consider nephrostomy or stenting (antegrade) to ensure adequate drainage.  3. Post-Operative Urinary Retention - Remove the catheter today and perform a voiding trial to assess his ability to urinate independently. - Monitor for any signs of urinary retention or complications. -Return later today to ensure  adequate emptying  F/u plan based on RUS results  I have reviewed the above documentation for accuracy and completeness, and I agree with the above.   [name], MD     [facility] [address], [address] [place], [place] [address] [phone]

## 2023-07-14 NOTE — Progress Notes (Unsigned)
 Catheter Removal  Patient is present today for a catheter removal.  9 ml of water was drained from the balloon. A 20 FR foley cath was removed from the bladder, no complications were noted. Patient tolerated well.  Performed by: Cloee Dunwoody H RMA  Follow up/ Additional notes: f/u this afternoon for PVR

## 2023-07-14 NOTE — Progress Notes (Signed)
 He has been voiding well today.  His PVR was 20 mL.  He will be scheduled for a renal ultrasound return appointment with Dr. Apolinar Junes to go over results.

## 2023-07-19 NOTE — Anesthesia Postprocedure Evaluation (Signed)
 Anesthesia Post Note  Patient: Kevin Glover  Procedure(s) Performed: URETEROSCOPY (Left) TURBT (TRANSURETHRAL RESECTION OF BLADDER TUMOR) CYSTOSCOPY/URETEROSCOPY  Patient location during evaluation: PACU Anesthesia Type: General Level of consciousness: awake and alert Pain management: pain level controlled Vital Signs Assessment: post-procedure vital signs reviewed and stable Respiratory status: spontaneous breathing, nonlabored ventilation, respiratory function stable and patient connected to nasal cannula oxygen Cardiovascular status: blood pressure returned to baseline and stable Postop Assessment: no apparent nausea or vomiting Anesthetic complications: no   There were no known notable events for this encounter.   Last Vitals:  Vitals:   07/05/23 1608 07/05/23 1626  BP:  (!) 168/74  Pulse: (!) 58 65  Resp: 10 16  Temp: (!) 36.1 C (!) 36.2 C  SpO2: 96% 96%    Last Pain:  Vitals:   07/05/23 1626  TempSrc: Temporal  PainSc: 0-No pain                 Lenard Simmer

## 2023-07-22 ENCOUNTER — Ambulatory Visit
Admission: RE | Admit: 2023-07-22 | Discharge: 2023-07-22 | Disposition: A | Discharge status: Home / Self Care | Source: Ambulatory Visit | Attending: Urology | Admitting: Urology

## 2023-07-22 DIAGNOSIS — N1330 Unspecified hydronephrosis: Secondary | ICD-10-CM | POA: Insufficient documentation

## 2023-07-23 ENCOUNTER — Encounter: Payer: Self-pay | Admitting: Urology

## 2023-07-27 DIAGNOSIS — N289 Disorder of kidney and ureter, unspecified: Secondary | ICD-10-CM | POA: Diagnosis not present

## 2023-07-27 DIAGNOSIS — J438 Other emphysema: Secondary | ICD-10-CM | POA: Diagnosis not present

## 2023-07-27 DIAGNOSIS — J449 Chronic obstructive pulmonary disease, unspecified: Secondary | ICD-10-CM | POA: Diagnosis not present

## 2023-07-27 DIAGNOSIS — J329 Chronic sinusitis, unspecified: Secondary | ICD-10-CM | POA: Diagnosis not present

## 2023-07-27 DIAGNOSIS — R918 Other nonspecific abnormal finding of lung field: Secondary | ICD-10-CM | POA: Diagnosis not present

## 2023-08-04 ENCOUNTER — Encounter: Payer: Self-pay | Admitting: Urology

## 2023-08-04 ENCOUNTER — Ambulatory Visit (INDEPENDENT_AMBULATORY_CARE_PROVIDER_SITE_OTHER): Admitting: Urology

## 2023-08-04 VITALS — BP 143/96 | HR 80 | Ht 64.0 in | Wt 185.0 lb

## 2023-08-04 DIAGNOSIS — N1330 Unspecified hydronephrosis: Secondary | ICD-10-CM | POA: Diagnosis not present

## 2023-08-04 NOTE — Progress Notes (Signed)
 Kevin Glover,acting as a scribe for Kevin Scotland, MD.,have documented all relevant documentation on the behalf of Kevin Scotland, MD,as directed by  Kevin Scotland, MD while in the presence of Kevin Scotland, MD.  08/04/23 3:05 PM   Kevin Glover 1940/09/20 [id number]  Referring provider: Barbette Reichmann, MD [address] [facility] [place],  [place] [address]  Chief Complaint  Patient presents with   Medical Management of Chronic Issues    HPI: 83 year old male with a personal history of a large renal mass obstructing the left ureteral orifice who presents for follow-up after an ultrasound of the kidney.  He has a history of amyloid in the bladder and has undergone a procedure to debulk the mass approximately five weeks ago.  Intraoperatively, a large nodular tumor, at least six centimeters, was noted, and the left ureteral orifice (UO) was not identified.   He reports improvement in urinary symptoms since the procedure, with reduced frequency of nocturia and no current leakage.   RUS shows persistent left hydronephrosis, no reflux  PMH: Past Medical History:  Diagnosis Date   Acquired hypothyroidism    a.) secondary to XRT induced thyroid damage when vocal cord treated   Acute ST elevation myocardial infarction (STEMI) of inferior wall (HCC) 05/12/2017   a.) LHC/PCI 05/12/2017: 99% m-dRCA (3.25 x 28 mm Xience Sierra DES)   Aortic atherosclerosis (HCC)    Arthritis    Aspiration pneumonia (HCC) 04/2012   Bilateral eyelid ptosis; s/p repair    BPH (benign prostatic hyperplasia)    Chronic cough    a.) secondary to "throat irritation" s/p XRT to vocal cords   COPD (chronic obstructive pulmonary disease) (HCC)    Coronary artery disease    a.) inf STEMI 05/12/2017 --> LHC: 50% oOM1-OM1, 40% pLAD, 60% oD1, 99% m-dRCA (3.25 x 28 mm Xience Sierra DES), 50% pRCA, 30% dRCA, 60% post atrio; b.) MV 01/09/2021: no ischemia   Dyspnea    Exertional dyspnea     Full dentures    History of bilateral cataract extraction    Left upper lobe pulmonary nodule    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   a.) s/p XRT   Long-term use of aspirin therapy    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   a.) CT hematuria workup 05/26/2023: LEFT hydroureteronephrosis due to plaque-like wall thickening involving the LEFT bladder wall and UVJ; highly suspicious for bladder carcinoma   Pure hypercholesterolemia    Renal cyst    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    Schmorl's nodes of lumbar region    Vocal cord polyp     Surgical History: Past Surgical History:  Procedure Laterality Date   BACK SURGERY     BROW LIFT Bilateral 09/22/2016   Procedure: BLEPHAROPLASTY;  Surgeon: Imagene Riches, MD;  Location: [facility];  Service: Ophthalmology;  Laterality: Bilateral;   CATARACT EXTRACTION W/PHACO Right 06/02/2016   Procedure: CATARACT EXTRACTION PHACO AND INTRAOCULAR LENS PLACEMENT (IOC) right;  Surgeon: Nevada Crane, MD;  Location: [facility];  Service: Ophthalmology;  Laterality: Right;   CATARACT EXTRACTION W/PHACO Left 06/30/2016   Procedure: CATARACT EXTRACTION PHACO AND INTRAOCULAR LENS PLACEMENT (IOC)  Left;  Surgeon: Nevada Crane, MD;  Location: [facility];  Service: Ophthalmology;  Laterality: Left;   CORONARY/GRAFT ACUTE MI REVASCULARIZATION N/A 05/12/2017   Procedure: Coronary/Graft Acute MI Revascularization;  Surgeon: Marcina Millard, MD;  Location:  [facility] INVASIVE CV LAB;  Service: Cardiovascular;  Laterality: N/A;   CYSTOSCOPY WITH BIOPSY N/A 06/03/2018   Procedure: CYSTOSCOPY WITH bladder BIOPSY;  Surgeon: Lawerence Pressman, MD;  Location: [facility] OR[facility];  Service: Urology;  Laterality: N/A;   CYSTOSCOPY WITH FULGERATION N/A 06/03/2018   Procedure: CYSTOSCOPY WITH FULGERATION;  Surgeon: Lawerence Pressman, MD;  Location: [facility] OR[facility];  Service: Urology;  Laterality:  N/A;   CYSTOSCOPY/URETEROSCOPY/HOLMIUM LASER/STENT PLACEMENT  07/05/2023   Procedure: CYSTOSCOPY/URETEROSCOPY;  Surgeon: Dustin Gimenez, MD;  Location: [facility] OR[facility];  Service: Urology;;   HERNIA REPAIR     KNEE ARTHROSCOPY WITH MEDIAL MENISECTOMY Right 09/02/2017   Procedure: KNEE ARTHROSCOPY WITH PARTIAL MEDIAL AND LATERAL MENISECTOMY, PARTIAL SYNOVECTOMY;  Surgeon: Molli Angelucci, MD;  Location: [facility] OR[facility];  Service: Orthopedics;  Laterality: Right;   LEFT HEART CATH AND CORONARY ANGIOGRAPHY N/A 05/12/2017   Procedure: LEFT HEART CATH AND CORONARY ANGIOGRAPHY;  Surgeon: Percival Brace, MD;  Location: [facility] INVASIVE CV LAB;  Service: Cardiovascular;  Laterality: N/A;   MICROLARYNGOSCOPY     3-4 yrs ago   MICROLARYNGOSCOPY Left 03/22/2015   Procedure: MICROLARYNGOSCOPY WITH EXCISION V CORD LEFT AND BIOPSY;  Surgeon: Lesly Raspberry, MD;  Location: [facility];  Service: ENT;  Laterality: Left;   PTOSIS REPAIR Bilateral 09/22/2016   Procedure: PTOSIS REPAIR;  Surgeon: Zacarias Hermann, MD;  Location: [facility];  Service: Ophthalmology;  Laterality: Bilateral;   TRANSURETHRAL RESECTION OF BLADDER TUMOR N/A 07/05/2023   Procedure: TURBT (TRANSURETHRAL RESECTION OF BLADDER TUMOR);  Surgeon: Dustin Gimenez, MD;  Location: [facility];  Service: Urology;  Laterality: N/A;   URETEROSCOPY Left 07/05/2023   Procedure: URETEROSCOPY;  Surgeon: Dustin Gimenez, MD;  Location: [facility] OR[facility];  Service: Urology;  Laterality: Left;   VEIN LIGATION Left     Home Medications:  Allergies as of 08/04/2023   No Known Allergies      Medication List        Accurate as of August 04, 2023  3:05 PM. If you have any questions, ask your nurse or doctor.          STOP taking these medications    HYDROcodone-acetaminophen 5-325 MG tablet Commonly known as: NORCO/VICODIN Stopped by: Dustin Gimenez       TAKE these medications    aspirin 81 MG chewable tablet Chew 1 tablet (81 mg total) by mouth  daily.   levothyroxine 25 MCG tablet Commonly known as: SYNTHROID Take 25 mcg by mouth daily before breakfast.   rosuvastatin 5 MG tablet Commonly known as: CRESTOR Take 5 mg by mouth daily.   Trelegy Ellipta 100-62.5-25 MCG/ACT Aepb Generic drug: Fluticasone-Umeclidin-Vilant Inhale 1 puff into the lungs daily as needed (cough).        Family History: Family History  Problem Relation Age of Onset   Cancer Mother        Melanoma   Prolactinoma Neg Hx    Prostate cancer Neg Hx    Kidney cancer Neg Hx     Social History:  reports that he quit smoking about 35 years ago. His smoking use included cigarettes. He has never used smokeless tobacco. He reports current alcohol use of about 1.0 - 2.0 standard drink of alcohol per week. He reports that he does not use drugs.   Physical Exam: BP (!) 143/96   Pulse 80   Ht 5\' 4"  (1.626 m)   Wt 185 lb (83.9 kg)   BMI 31.76 kg/m   Constitutional:  Alert and oriented, No acute  distress. HEENT: [place] AT, moist mucus membranes.  Trachea midline, no masses. Neurologic: Grossly intact, no focal deficits, moving all 4 extremities. Psychiatric: Normal mood and affect.    Pertinent Imaging: EXAM: RENAL / URINARY TRACT ULTRASOUND COMPLETE  COMPARISON:  CT abdomen and pelvis May 26, 2023  FINDINGS: Right Kidney:  Renal measurements: 10.3 x 4.5 x 5.1 cm = volume: 123.79 mL. Echogenicity within normal limits. No hydronephrosis visualized. There is a 2.1 x 1.8 x 1.4 cm cyst, no follow-up is recommended.  Left Kidney:  Renal measurements: 11.5 x 5.9 x 4.9 cm = volume: 172.9 mL. Echogenicity within normal limits. There is a 1 x 1 x 1.1 cm cyst, no follow-up is recommended. Mild left hydronephrosis.  Bladder:  Bladder is partially contracted with questioned thickened wall. There is questioned echogenic focus in the left bladder wall measuring 1.4 cm. Prevoid volume of 85.93 cc and postvoid volume of 80.6 cc.  Other:  Prostate  gland measures 5 x 5.5 x 5.1 cm with volume of 73.73 cc.  IMPRESSION: 1. Mild left hydronephrosis. 2. Bladder is partially contracted with questioned thickened wall. There is questioned echogenic focus in the left bladder wall measuring 1.4 cm. Recommend further evaluation with cystoscopy. 3. Enlarged prostate gland.   Electronically Signed By: Anna Barnes M.D. On: 07/22/2023 11:58  This was personally reviewed and I agree with the radiologic interpretation.   Assessment & Plan:    1. Obstructing Renal Mass - The left kidney remains swollen, suggesting a partial obstruction.  -Offered additional evaluation vs. PNC/ antegrade stent placement - A Lasix renal study is planned to assess the drainage function of the kidney. This study will help determine the degree of obstruction and guide further management.  - If the obstruction is mild, conservative management may be considered. If significant obstruction is confirmed, interventional radiology may be consulted for potential stent placement to ensure adequate drainage.  2. Amyloidosis of the bladder - He has been informed about the possibility of managing amyloidosis with therapies similar to chemotherapy.  - A referral to medical oncology/hematology is being made to discuss potential treatment options.- case discussed with all providers for [facility]- pt does not want to go to [facility] - The oncology team will evaluate and recommend appropriate therapies to manage the amyloidosis and prevent further complications.  3. Aspirin Therapy - He was advised to resume aspirin therapy as previously instructed by their cardiologist to protect the cardiac stent.  Return for discussion of Lasix renal study.  I have reviewed the above documentation for accuracy and completeness, and I agree with the above.   Dustin Gimenez, MD    [facility] [address], [address] [place], [place] [address] [phone]

## 2023-08-06 ENCOUNTER — Encounter: Payer: Self-pay | Admitting: Oncology

## 2023-08-06 ENCOUNTER — Inpatient Hospital Stay: Attending: Oncology | Admitting: Oncology

## 2023-08-06 ENCOUNTER — Inpatient Hospital Stay

## 2023-08-06 VITALS — BP 144/94 | HR 71 | Temp 97.8°F | Resp 18 | Ht 64.0 in | Wt 180.0 lb

## 2023-08-06 DIAGNOSIS — N33 Bladder disorders in diseases classified elsewhere: Secondary | ICD-10-CM

## 2023-08-06 DIAGNOSIS — E854 Organ-limited amyloidosis: Secondary | ICD-10-CM | POA: Insufficient documentation

## 2023-08-06 LAB — CBC (CANCER CENTER ONLY)
HCT: 41.2 % (ref 39.0–52.0)
Hemoglobin: 13.6 g/dL (ref 13.0–17.0)
MCH: 30.3 pg (ref 26.0–34.0)
MCHC: 33 g/dL (ref 30.0–36.0)
MCV: 91.8 fL (ref 80.0–100.0)
Platelet Count: 192 10*3/uL (ref 150–400)
RBC: 4.49 MIL/uL (ref 4.22–5.81)
RDW: 14.6 % (ref 11.5–15.5)
WBC Count: 6.2 10*3/uL (ref 4.0–10.5)
nRBC: 0 % (ref 0.0–0.2)

## 2023-08-06 LAB — BASIC METABOLIC PANEL - CANCER CENTER ONLY
Anion gap: 10 (ref 5–15)
BUN: 19 mg/dL (ref 8–23)
CO2: 24 mmol/L (ref 22–32)
Calcium: 9.4 mg/dL (ref 8.9–10.3)
Chloride: 103 mmol/L (ref 98–111)
Creatinine: 1 mg/dL (ref 0.61–1.24)
GFR, Estimated: >60 mL/min (ref >60)
Glucose, Bld: 115 mg/dL — ABNORMAL HIGH (ref 70–99)
Potassium: 3.8 mmol/L (ref 3.5–5.1)
Sodium: 137 mmol/L (ref 135–145)

## 2023-08-06 NOTE — Progress Notes (Signed)
 [facility]  Telephone:([phone]) [phone] Fax:(336) [phone]  ID: KAY RICCIUTI OB: 1940-07-25  MR#: [id number]  AOZ#:[id number]  Patient Care Team: Antonio Baumgarten, MD as PCP - General (Internal Medicine) Lesly Raspberry, MD (Unknown Physician Specialty) Shellie Dials, MD as Consulting Physician (Oncology)  CHIEF COMPLAINT: Amyloidosis of the bladder.  INTERVAL HISTORY: Patient is an 83 year old male who was having hematuria and subsequent cystoscopy and biopsy revealed amyloidosis.  He currently feels well and is asymptomatic.  He has no neurologic complaints.  He denies any recent fevers or illnesses.  He has a good appetite and denies weight loss.  He has no chest pain, shortness of breath, cough, or hemoptysis.  He denies any nausea, vomiting, constipation, or diarrhea.  He has no urinary complaints.  Patient feels at his baseline and offers no specific complaints today.  REVIEW OF SYSTEMS:   Review of Systems  Constitutional: Negative.  Negative for fever, malaise/fatigue and weight loss.  Respiratory: Negative.  Negative for cough, hemoptysis and shortness of breath.   Cardiovascular: Negative.  Negative for chest pain and leg swelling.  Gastrointestinal: Negative.  Negative for abdominal pain.  Genitourinary:  Positive for hematuria.  Musculoskeletal: Negative.  Negative for back pain.  Skin: Negative.  Negative for rash.  Neurological: Negative.  Negative for dizziness, focal weakness, weakness and headaches.  Psychiatric/Behavioral: Negative.  The patient is not nervous/anxious.     As per HPI. Otherwise, a complete review of systems is negative.  PAST MEDICAL HISTORY: Past Medical History:  Diagnosis Date   Acquired hypothyroidism    a.) secondary to XRT induced thyroid  damage when vocal cord treated   Acute ST elevation myocardial infarction (STEMI) of inferior wall (HCC) 05/12/2017   a.) LHC/PCI 05/12/2017: 99% m-dRCA (3.25 x 28 mm Xience Sierra  DES)   Aortic atherosclerosis (HCC)    Arthritis    Aspiration pneumonia (HCC) 04/2012   Bilateral eyelid ptosis; s/p repair    BPH (benign prostatic hyperplasia)    Chronic cough    a.) secondary to "throat irritation" s/p XRT to vocal cords   COPD (chronic obstructive pulmonary disease) (HCC)    Coronary artery disease    a.) inf STEMI 05/12/2017 --> LHC: 50% oOM1-OM1, 40% pLAD, 60% oD1, 99% m-dRCA (3.25 x 28 mm Xience Sierra DES), 50% pRCA, 30% dRCA, 60% post atrio; b.) MV 01/09/2021: no ischemia   Dyspnea    Exertional dyspnea    Full dentures    History of bilateral cataract extraction    Left upper lobe pulmonary nodule    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   a.) s/p XRT   Long-term use of aspirin  therapy    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   a.) CT hematuria workup 05/26/2023: LEFT hydroureteronephrosis due to plaque-like wall thickening involving the LEFT bladder wall and UVJ; highly suspicious for bladder carcinoma   Pure hypercholesterolemia    Renal cyst    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    Schmorl's nodes of lumbar region    Vocal cord polyp     PAST SURGICAL HISTORY: Past Surgical History:  Procedure Laterality Date   BACK SURGERY     BROW LIFT Bilateral 09/22/2016   Procedure: BLEPHAROPLASTY;  Surgeon: Zacarias Hermann, MD;  Location: [facility];  Service: Ophthalmology;  Laterality: Bilateral;   CATARACT EXTRACTION W/PHACO Right 06/02/2016   Procedure: CATARACT EXTRACTION PHACO AND INTRAOCULAR LENS PLACEMENT (IOC) right;  Surgeon: Dariel Edelson  Lydia Sams, MD;  Location: [facility];  Service: Ophthalmology;  Laterality: Right;   CATARACT EXTRACTION W/PHACO Left 06/30/2016   Procedure: CATARACT EXTRACTION PHACO AND INTRAOCULAR LENS PLACEMENT (IOC)  Left;  Surgeon: Rosa College, MD;  Location: [facility];  Service: Ophthalmology;  Laterality: Left;   CORONARY/GRAFT ACUTE  MI REVASCULARIZATION N/A 05/12/2017   Procedure: Coronary/Graft Acute MI Revascularization;  Surgeon: Percival Brace, MD;  Location: [facility];  Service: Cardiovascular;  Laterality: N/A;   CYSTOSCOPY WITH BIOPSY N/A 06/03/2018   Procedure: CYSTOSCOPY WITH bladder BIOPSY;  Surgeon: Lawerence Pressman, MD;  Location: [facility];  Service: Urology;  Laterality: N/A;   CYSTOSCOPY WITH FULGERATION N/A 06/03/2018   Procedure: CYSTOSCOPY WITH FULGERATION;  Surgeon: Lawerence Pressman, MD;  Location: [facility];  Service: Urology;  Laterality: N/A;   CYSTOSCOPY/URETEROSCOPY/HOLMIUM LASER/STENT PLACEMENT  07/05/2023   Procedure: CYSTOSCOPY/URETEROSCOPY;  Surgeon: Dustin Gimenez, MD;  Location: [facility];  Service: Urology;;   HERNIA REPAIR     KNEE ARTHROSCOPY WITH MEDIAL MENISECTOMY Right 09/02/2017   Procedure: KNEE ARTHROSCOPY WITH PARTIAL MEDIAL AND LATERAL MENISECTOMY, PARTIAL SYNOVECTOMY;  Surgeon: Molli Angelucci, MD;  Location: [facility];  Service: Orthopedics;  Laterality: Right;   LEFT HEART CATH AND CORONARY ANGIOGRAPHY N/A 05/12/2017   Procedure: LEFT HEART CATH AND CORONARY ANGIOGRAPHY;  Surgeon: Percival Brace, MD;  Location: [facility];  Service: Cardiovascular;  Laterality: N/A;   MICROLARYNGOSCOPY     3-4 yrs ago   MICROLARYNGOSCOPY Left 03/22/2015   Procedure: MICROLARYNGOSCOPY WITH EXCISION V CORD LEFT AND BIOPSY;  Surgeon: Lesly Raspberry, MD;  Location: [facility];  Service: ENT;  Laterality: Left;   PTOSIS REPAIR Bilateral 09/22/2016   Procedure: PTOSIS REPAIR;  Surgeon: Zacarias Hermann, MD;  Location: [facility];  Service: Ophthalmology;  Laterality: Bilateral;   TRANSURETHRAL RESECTION OF BLADDER TUMOR N/A 07/05/2023   Procedure: TURBT (TRANSURETHRAL RESECTION OF BLADDER TUMOR);  Surgeon: Dustin Gimenez, MD;  Location: [facility];  Service: Urology;  Laterality: N/A;   URETEROSCOPY Left 07/05/2023   Procedure: URETEROSCOPY;  Surgeon: Dustin Gimenez, MD;  Location: [facility];  Service: Urology;  Laterality: Left;   VEIN LIGATION Left     FAMILY HISTORY: Family History  Problem Relation Age of Onset   Cancer Mother        Melanoma   Prolactinoma Neg Hx    Prostate cancer Neg Hx    Kidney cancer Neg Hx     ADVANCED DIRECTIVES (Y/N):  N  HEALTH MAINTENANCE: Social History   Tobacco Use   Smoking status: Former    Current packs/day: 0.00    Types: Cigarettes    Quit date: 04/20/1988    Years since quitting: 35.3   Smokeless tobacco: Never   Tobacco comments:    quit 1991  Vaping Use   Vaping status: Never Used  Substance Use Topics   Alcohol  use: Yes    Alcohol /week: 1.0 - 2.0 standard drink of alcohol     Types: 1 - 2 Cans of beer per week    Comment: 1 to 2 beers most days   Drug use: No     Colonoscopy:  PAP:  Bone density:  Lipid panel:  No Known Allergies  Current Outpatient Medications  Medication Sig Dispense Refill   aspirin  81 MG chewable tablet Chew 1 tablet (81 mg total) by mouth daily.     levothyroxine  (SYNTHROID , LEVOTHROID) 25 MCG tablet Take 25 mcg by mouth daily before breakfast.  rosuvastatin (CRESTOR) 5 MG tablet Take 5 mg by mouth daily.     TRELEGY ELLIPTA 100-62.5-25 MCG/ACT AEPB Inhale 1 puff into the lungs daily as needed (cough).     No current facility-administered medications for this visit.    OBJECTIVE: Vitals:   08/06/23 1119  BP: (!) 144/94  Pulse: 71  Resp: 18  Temp: 97.8 F (36.6 C)  SpO2: 98%     Body mass index is 30.9 kg/m.    ECOG FS:0 - Asymptomatic  General: Well-developed, well-nourished, no acute distress. Eyes: Pink conjunctiva, anicteric sclera. HEENT: Normocephalic, moist mucous membranes. Lungs: No audible wheezing or coughing. Heart: Regular rate and rhythm. Abdomen: Soft, nontender, no obvious distention. Musculoskeletal: No edema, cyanosis, or clubbing. Neuro: Alert, answering all questions appropriately. Cranial nerves grossly  intact. Skin: No rashes or petechiae noted. Psych: Normal affect. Lymphatics: No cervical, calvicular, axillary or inguinal LAD.   LAB RESULTS:  Lab Results  Component Value Date   NA 138 06/14/2018   K 3.6 06/14/2018   CL 105 06/14/2018   CO2 26 06/14/2018   GLUCOSE 102 (H) 06/14/2018   BUN 16 06/14/2018   CREATININE 1.20 08/04/2021   CALCIUM  9.4 06/14/2018   PROT 7.3 05/12/2017   ALBUMIN 3.9 05/12/2017   AST 30 05/12/2017   ALT 21 05/12/2017   ALKPHOS 73 05/12/2017   BILITOT 0.7 05/12/2017   GFRNONAA >60 06/14/2018   GFRAA >60 06/14/2018    Lab Results  Component Value Date   WBC 7.4 06/14/2018   NEUTROABS 4.3 05/12/2017   HGB 13.7 06/14/2018   HCT 41.6 06/14/2018   MCV 90.6 06/14/2018   PLT 231 06/14/2018     STUDIES: US  RENAL Result Date: 07/22/2023 CLINICAL DATA:  Left hydronephrosis. EXAM: RENAL / URINARY TRACT ULTRASOUND COMPLETE COMPARISON:  CT abdomen and pelvis May 26, 2023 FINDINGS: Right Kidney: Renal measurements: 10.3 x 4.5 x 5.1 cm = volume: 123.79 mL. Echogenicity within normal limits. No hydronephrosis visualized. There is a 2.1 x 1.8 x 1.4 cm cyst, no follow-up is recommended. Left Kidney: Renal measurements: 11.5 x 5.9 x 4.9 cm = volume: 172.9 mL. Echogenicity within normal limits. There is a 1 x 1 x 1.1 cm cyst, no follow-up is recommended. Mild left hydronephrosis. Bladder: Bladder is partially contracted with questioned thickened wall. There is questioned echogenic focus in the left bladder wall measuring 1.4 cm. Prevoid volume of 85.93 cc and postvoid volume of 80.6 cc. Other: Prostate gland measures 5 x 5.5 x 5.1 cm with volume of 73.73 cc. IMPRESSION: 1. Mild left hydronephrosis. 2. Bladder is partially contracted with questioned thickened wall. There is questioned echogenic focus in the left bladder wall measuring 1.4 cm. Recommend further evaluation with cystoscopy. 3. Enlarged prostate gland. Electronically Signed   By: Anna Barnes M.D.   On:  07/22/2023 11:58    ASSESSMENT: Amyloidosis of the bladder.  PLAN:    Amyloidosis of the bladder: Diagnosis confirmed by biopsy from recent cystoscopy.  Have recommended bone marrow biopsy and further imaging to assess for additional systemic disease, but patient declined any further workup at this time stating he would rather finish the workup and procedure as recommended by urology before addressing additional studies.  He also stated he is unsure given his advanced age if he would want to undergo systemic treatment, but agreed to discuss this later if needed.  SPEP, kappa/lambda free light chains, and immunoglobulins were drawn today for completeness and are pending at time of dictation.  No  intervention is needed.  Return to clinic in 3 months with repeat laboratory work, further evaluation, and additional discussion of bone marrow biopsy and other diagnostic testing.  I spent a total of 60 minutes reviewing chart data, face-to-face evaluation with the patient, counseling and coordination of care as detailed above.   Patient expressed understanding and was in agreement with this plan. He also understands that He can call clinic at any time with any questions, concerns, or complaints.     Shellie Dials, MD   08/06/2023 11:30 AM

## 2023-08-07 LAB — BETA 2 MICROGLOBULIN, SERUM: Beta-2 Microglobulin: 2 mg/L (ref 0.6–2.4)

## 2023-08-08 LAB — IGG, IGA, IGM
IgA: 226 mg/dL (ref 61–437)
IgG (Immunoglobin G), Serum: 932 mg/dL (ref 603–1613)
IgM (Immunoglobulin M), Srm: 202 mg/dL — ABNORMAL HIGH (ref 15–143)

## 2023-08-09 LAB — PROTEIN ELECTROPHORESIS, SERUM
A/G Ratio: 1.2 (ref 0.7–1.7)
Albumin ELP: 3.9 g/dL (ref 2.9–4.4)
Alpha-1-Globulin: 0.3 g/dL (ref 0.0–0.4)
Alpha-2-Globulin: 0.8 g/dL (ref 0.4–1.0)
Beta Globulin: 1.1 g/dL (ref 0.7–1.3)
Gamma Globulin: 1 g/dL (ref 0.4–1.8)
Globulin, Total: 3.3 g/dL (ref 2.2–3.9)
Total Protein ELP: 7.2 g/dL (ref 6.0–8.5)

## 2023-08-09 LAB — KAPPA/LAMBDA LIGHT CHAINS
Kappa free light chain: 24 mg/L — ABNORMAL HIGH (ref 3.3–19.4)
Kappa, lambda light chain ratio: 1.22 (ref 0.26–1.65)
Lambda free light chains: 19.7 mg/L (ref 5.7–26.3)

## 2023-08-18 ENCOUNTER — Encounter
Admission: RE | Admit: 2023-08-18 | Discharge: 2023-08-18 | Disposition: A | Discharge status: Home / Self Care | Source: Ambulatory Visit | Attending: Urology | Admitting: Urology

## 2023-08-18 DIAGNOSIS — N1330 Unspecified hydronephrosis: Secondary | ICD-10-CM | POA: Insufficient documentation

## 2023-08-18 MED ORDER — FUROSEMIDE 10 MG/ML IJ SOLN
40.0000 mg | Freq: Once | INTRAMUSCULAR | Status: DC
  Filled 2023-08-18: qty 4

## 2023-08-18 MED ORDER — TECHNETIUM TC 99M MERTIATIDE
5.0000 | Freq: Once | INTRAVENOUS | Status: AC | PRN
  Administered 2023-08-18: 5.46 via INTRAVENOUS

## 2023-10-12 DIAGNOSIS — E039 Hypothyroidism, unspecified: Secondary | ICD-10-CM | POA: Diagnosis not present

## 2023-10-12 DIAGNOSIS — Z87891 Personal history of nicotine dependence: Secondary | ICD-10-CM | POA: Diagnosis not present

## 2023-10-12 DIAGNOSIS — R8290 Unspecified abnormal findings in urine: Secondary | ICD-10-CM | POA: Diagnosis not present

## 2023-10-12 DIAGNOSIS — R319 Hematuria, unspecified: Secondary | ICD-10-CM | POA: Diagnosis not present

## 2023-10-12 DIAGNOSIS — I2510 Atherosclerotic heart disease of native coronary artery without angina pectoris: Secondary | ICD-10-CM | POA: Diagnosis not present

## 2023-10-12 DIAGNOSIS — R972 Elevated prostate specific antigen [PSA]: Secondary | ICD-10-CM | POA: Diagnosis not present

## 2023-10-12 DIAGNOSIS — R7309 Other abnormal glucose: Secondary | ICD-10-CM | POA: Diagnosis not present

## 2023-10-12 DIAGNOSIS — C320 Malignant neoplasm of glottis: Secondary | ICD-10-CM | POA: Diagnosis not present

## 2023-10-19 DIAGNOSIS — Z0000 Encounter for general adult medical examination without abnormal findings: Secondary | ICD-10-CM | POA: Diagnosis not present

## 2023-10-19 DIAGNOSIS — Z1331 Encounter for screening for depression: Secondary | ICD-10-CM | POA: Diagnosis not present

## 2023-10-19 DIAGNOSIS — L570 Actinic keratosis: Secondary | ICD-10-CM | POA: Diagnosis not present

## 2023-10-19 DIAGNOSIS — E039 Hypothyroidism, unspecified: Secondary | ICD-10-CM | POA: Diagnosis not present

## 2023-10-19 DIAGNOSIS — R7309 Other abnormal glucose: Secondary | ICD-10-CM | POA: Diagnosis not present

## 2023-10-19 DIAGNOSIS — I2510 Atherosclerotic heart disease of native coronary artery without angina pectoris: Secondary | ICD-10-CM | POA: Diagnosis not present

## 2023-10-19 DIAGNOSIS — R972 Elevated prostate specific antigen [PSA]: Secondary | ICD-10-CM | POA: Diagnosis not present

## 2023-10-19 DIAGNOSIS — Z8521 Personal history of malignant neoplasm of larynx: Secondary | ICD-10-CM | POA: Diagnosis not present

## 2023-10-19 DIAGNOSIS — E854 Organ-limited amyloidosis: Secondary | ICD-10-CM | POA: Diagnosis not present

## 2023-11-05 ENCOUNTER — Other Ambulatory Visit

## 2023-11-12 ENCOUNTER — Ambulatory Visit: Admitting: Oncology

## 2023-12-08 ENCOUNTER — Ambulatory Visit: Admitting: Urology

## 2023-12-08 VITALS — BP 141/80 | HR 61 | Ht 71.0 in | Wt 173.0 lb

## 2023-12-08 DIAGNOSIS — E854 Organ-limited amyloidosis: Secondary | ICD-10-CM | POA: Diagnosis not present

## 2023-12-08 DIAGNOSIS — N33 Bladder disorders in diseases classified elsewhere: Secondary | ICD-10-CM | POA: Diagnosis not present

## 2023-12-13 ENCOUNTER — Encounter: Payer: Self-pay | Admitting: Urology

## 2023-12-13 NOTE — Progress Notes (Signed)
 12/08/2023 10:41 AM   Kevin Glover Getting [birth year] [id number]  Referring provider: Sadie Manna, MD [address] [facility] [place],  [place] [address]  Chief Complaint  Patient presents with   Nephrolithiasis    HPI: Kevin Glover is a 83 y.o. male presents for a 41-month follow-up.  Refer to Dr. Bjorn previous note 08/04/2023.  Follow-up Lasix  renogram showed no hydronephrosis or evidence of obstruction. He saw Dr. Jacobo in oncology and based on his age elected not to undergo further management/therapy of amyloidosis No complaints today   PMH: Past Medical History:  Diagnosis Date   Acquired hypothyroidism    a.) secondary to XRT induced thyroid  damage when vocal cord treated   Acute ST elevation myocardial infarction (STEMI) of inferior wall (HCC) 05/12/2017   a.) LHC/PCI 05/12/2017: 99% m-dRCA (3.25 x 28 mm Xience Sierra DES)   Aortic atherosclerosis (HCC)    Arthritis    Aspiration pneumonia (HCC) 04/2012   Bilateral eyelid ptosis; s/p repair    BPH (benign prostatic hyperplasia)    Chronic cough    a.) secondary to throat irritation s/p XRT to vocal cords   COPD (chronic obstructive pulmonary disease) (HCC)    Coronary artery disease    a.) inf STEMI 05/12/2017 --> LHC: 50% oOM1-OM1, 40% pLAD, 60% oD1, 99% m-dRCA (3.25 x 28 mm Xience Sierra DES), 50% pRCA, 30% dRCA, 60% post atrio; b.) MV 01/09/2021: no ischemia   Dyspnea    Exertional dyspnea    Full dentures    History of bilateral cataract extraction    Left upper lobe pulmonary nodule    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   a.) s/p XRT   Long-term use of aspirin  therapy    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   a.) CT hematuria workup 05/26/2023: LEFT hydroureteronephrosis due to plaque-like wall thickening involving the LEFT bladder wall and UVJ; highly suspicious for bladder carcinoma   Pure hypercholesterolemia    Renal cyst    Right bundle  branch block (RBBB) with left anterior fascicular block    Schmorl's nodes of lumbar region    Vocal cord polyp     Surgical History: Past Surgical History:  Procedure Laterality Date   BACK SURGERY     BROW LIFT Bilateral 09/22/2016   Procedure: BLEPHAROPLASTY;  Surgeon: Ashley Greig HERO, MD;  Location: [facility];  Service: Ophthalmology;  Laterality: Bilateral;   CATARACT EXTRACTION W/PHACO Right 06/02/2016   Procedure: CATARACT EXTRACTION PHACO AND INTRAOCULAR LENS PLACEMENT (IOC) right;  Surgeon: Adine Oneil Novak, MD;  Location: [facility];  Service: Ophthalmology;  Laterality: Right;   CATARACT EXTRACTION W/PHACO Left 06/30/2016   Procedure: CATARACT EXTRACTION PHACO AND INTRAOCULAR LENS PLACEMENT (IOC)  Left;  Surgeon: Adine Oneil Novak, MD;  Location: [facility];  Service: Ophthalmology;  Laterality: Left;   CORONARY/GRAFT ACUTE MI REVASCULARIZATION N/A 05/12/2017   Procedure: Coronary/Graft Acute MI Revascularization;  Surgeon: Ammon Blunt, MD;  Location: [facility];  Service: Cardiovascular;  Laterality: N/A;   CYSTOSCOPY WITH BIOPSY N/A 06/03/2018   Procedure: CYSTOSCOPY WITH bladder BIOPSY;  Surgeon: Francisca Redell BROCKS, MD;  Location: [facility];  Service: Urology;  Laterality: N/A;   CYSTOSCOPY WITH FULGERATION N/A 06/03/2018   Procedure: CYSTOSCOPY WITH FULGERATION;  Surgeon: Francisca Redell BROCKS, MD;  Location: [facility];  Service: Urology;  Laterality: N/A;   CYSTOSCOPY/URETEROSCOPY/HOLMIUM LASER/STENT PLACEMENT  07/05/2023   Procedure: CYSTOSCOPY/URETEROSCOPY;  Surgeon: Penne Knee, MD;  Location:  [facility];  Service: Urology;;   HERNIA REPAIR     KNEE ARTHROSCOPY WITH MEDIAL MENISECTOMY Right 09/02/2017   Procedure: KNEE ARTHROSCOPY WITH PARTIAL MEDIAL AND LATERAL MENISECTOMY, PARTIAL SYNOVECTOMY;  Surgeon: Kathlynn Sharper, MD;  Location: [facility];  Service: Orthopedics;  Laterality: Right;   LEFT HEART CATH AND CORONARY ANGIOGRAPHY N/A  05/12/2017   Procedure: LEFT HEART CATH AND CORONARY ANGIOGRAPHY;  Surgeon: Ammon Blunt, MD;  Location: [facility];  Service: Cardiovascular;  Laterality: N/A;   MICROLARYNGOSCOPY     3-4 yrs ago   MICROLARYNGOSCOPY Left 03/22/2015   Procedure: MICROLARYNGOSCOPY WITH EXCISION V CORD LEFT AND BIOPSY;  Surgeon: Chinita Hasten, MD;  Location: [facility];  Service: ENT;  Laterality: Left;   PTOSIS REPAIR Bilateral 09/22/2016   Procedure: PTOSIS REPAIR;  Surgeon: Ashley Greig HERO, MD;  Location: [facility];  Service: Ophthalmology;  Laterality: Bilateral;   TRANSURETHRAL RESECTION OF BLADDER TUMOR N/A 07/05/2023   Procedure: TURBT (TRANSURETHRAL RESECTION OF BLADDER TUMOR);  Surgeon: Penne Knee, MD;  Location: [facility];  Service: Urology;  Laterality: N/A;   URETEROSCOPY Left 07/05/2023   Procedure: URETEROSCOPY;  Surgeon: Penne Knee, MD;  Location: [facility];  Service: Urology;  Laterality: Left;   VEIN LIGATION Left     Home Medications:  Allergies as of 12/08/2023   No Known Allergies      Medication List        Accurate as of December 08, 2023 11:59 PM. If you have any questions, ask your nurse or doctor.          aspirin  81 MG chewable tablet Chew 1 tablet (81 mg total) by mouth daily.   levothyroxine  25 MCG tablet Commonly known as: SYNTHROID  Take 25 mcg by mouth daily before breakfast.   rosuvastatin 5 MG tablet Commonly known as: CRESTOR Take 5 mg by mouth daily.   Trelegy Ellipta 100-62.5-25 MCG/ACT Aepb Generic drug: Fluticasone-Umeclidin-Vilant Inhale 1 puff into the lungs daily as needed (cough).        Allergies: No Known Allergies  Family History: Family History  Problem Relation Age of Onset   Cancer Mother        Melanoma   Prolactinoma Neg Hx    Prostate cancer Neg Hx    Kidney cancer Neg Hx     Social History:  reports that he quit smoking about 35 years ago. His smoking use included cigarettes. He has  never used smokeless tobacco. He reports current alcohol  use of about 1.0 - 2.0 standard drink of alcohol  per week. He reports that he does not use drugs.   Physical Exam: BP (!) 141/80   Pulse 61   Ht 5' 11 (1.803 m)   Wt 173 lb (78.5 kg)   BMI 24.13 kg/m   Constitutional:  Alert, No acute distress. HEENT: [place] AT Respiratory: Normal respiratory effort, no increased work of breathing. Psychiatric: Normal mood and affect.   Assessment & Plan:    1.  Bladder amyloidosis Status post TURBT for bladder mass obstructing the left UVJ with pathology amyloidosis Seen by medical oncology and has declined further management Bladder amyloidosis does can recur and have recommended follow-up cystoscopy 1 year.   Kevin JAYSON Barba, MD  [facility] [address] [place], [place] [address] [phone]

## 2023-12-23 DIAGNOSIS — R0609 Other forms of dyspnea: Secondary | ICD-10-CM | POA: Diagnosis not present

## 2023-12-23 DIAGNOSIS — N33 Bladder disorders in diseases classified elsewhere: Secondary | ICD-10-CM | POA: Diagnosis not present

## 2023-12-23 DIAGNOSIS — E854 Organ-limited amyloidosis: Secondary | ICD-10-CM | POA: Diagnosis not present

## 2023-12-23 DIAGNOSIS — I2111 ST elevation (STEMI) myocardial infarction involving right coronary artery: Secondary | ICD-10-CM | POA: Diagnosis not present

## 2023-12-23 DIAGNOSIS — E7800 Pure hypercholesterolemia, unspecified: Secondary | ICD-10-CM | POA: Diagnosis not present

## 2023-12-28 DIAGNOSIS — R0609 Other forms of dyspnea: Secondary | ICD-10-CM | POA: Diagnosis not present

## 2023-12-28 DIAGNOSIS — I2111 ST elevation (STEMI) myocardial infarction involving right coronary artery: Secondary | ICD-10-CM | POA: Diagnosis not present

## 2024-01-18 DIAGNOSIS — N33 Bladder disorders in diseases classified elsewhere: Secondary | ICD-10-CM | POA: Diagnosis not present

## 2024-01-18 DIAGNOSIS — R0609 Other forms of dyspnea: Secondary | ICD-10-CM | POA: Diagnosis not present

## 2024-01-18 DIAGNOSIS — E854 Organ-limited amyloidosis: Secondary | ICD-10-CM | POA: Diagnosis not present

## 2024-01-18 DIAGNOSIS — I2111 ST elevation (STEMI) myocardial infarction involving right coronary artery: Secondary | ICD-10-CM | POA: Diagnosis not present

## 2024-01-18 DIAGNOSIS — E7800 Pure hypercholesterolemia, unspecified: Secondary | ICD-10-CM | POA: Diagnosis not present

## 2024-05-24 ENCOUNTER — Telehealth: Payer: Self-pay

## 2024-05-24 NOTE — Telephone Encounter (Signed)
 Pt LM on triage line  states is urine is dark brown and he would like to discuss.   LMTRC

## 2024-12-08 ENCOUNTER — Ambulatory Visit: Admitting: Urology
